# Patient Record
Sex: Female | Born: 1935 | Race: White | Hispanic: No | Smoking: Never smoker
Health system: Southern US, Community
[De-identification: ages and names within clinical notes are randomized; demographics above are authoritative.]

## PROBLEM LIST (undated history)

## (undated) DIAGNOSIS — F32A Depression, unspecified: Secondary | ICD-10-CM

## (undated) DIAGNOSIS — M549 Dorsalgia, unspecified: Secondary | ICD-10-CM

## (undated) DIAGNOSIS — R011 Cardiac murmur, unspecified: Secondary | ICD-10-CM

## (undated) DIAGNOSIS — R238 Other skin changes: Secondary | ICD-10-CM

## (undated) DIAGNOSIS — Z87898 Personal history of other specified conditions: Secondary | ICD-10-CM

## (undated) DIAGNOSIS — M810 Age-related osteoporosis without current pathological fracture: Secondary | ICD-10-CM

## (undated) DIAGNOSIS — I341 Nonrheumatic mitral (valve) prolapse: Secondary | ICD-10-CM

## (undated) DIAGNOSIS — G629 Polyneuropathy, unspecified: Secondary | ICD-10-CM

## (undated) DIAGNOSIS — R351 Nocturia: Secondary | ICD-10-CM

## (undated) DIAGNOSIS — R233 Spontaneous ecchymoses: Secondary | ICD-10-CM

## (undated) DIAGNOSIS — D649 Anemia, unspecified: Secondary | ICD-10-CM

## (undated) DIAGNOSIS — R609 Edema, unspecified: Secondary | ICD-10-CM

## (undated) DIAGNOSIS — I4891 Unspecified atrial fibrillation: Secondary | ICD-10-CM

## (undated) DIAGNOSIS — Z87442 Personal history of urinary calculi: Secondary | ICD-10-CM

## (undated) DIAGNOSIS — M1990 Unspecified osteoarthritis, unspecified site: Secondary | ICD-10-CM

## (undated) DIAGNOSIS — G20 Parkinson's disease: Secondary | ICD-10-CM

## (undated) DIAGNOSIS — F329 Major depressive disorder, single episode, unspecified: Secondary | ICD-10-CM

## (undated) DIAGNOSIS — N2889 Other specified disorders of kidney and ureter: Secondary | ICD-10-CM

## (undated) DIAGNOSIS — G20A1 Parkinson's disease without dyskinesia, without mention of fluctuations: Secondary | ICD-10-CM

## (undated) DIAGNOSIS — K219 Gastro-esophageal reflux disease without esophagitis: Secondary | ICD-10-CM

## (undated) DIAGNOSIS — R350 Frequency of micturition: Secondary | ICD-10-CM

## (undated) HISTORY — PX: TONSILLECTOMY: SUR1361

## (undated) HISTORY — DX: Unspecified osteoarthritis, unspecified site: M19.90

## (undated) HISTORY — DX: Parkinson's disease without dyskinesia, without mention of fluctuations: G20.A1

## (undated) HISTORY — PX: CATARACT EXTRACTION W/ INTRAOCULAR LENS  IMPLANT, BILATERAL: SHX1307

## (undated) HISTORY — PX: COLONOSCOPY: SHX174

## (undated) HISTORY — DX: Parkinson's disease: G20

## (undated) HISTORY — DX: Dorsalgia, unspecified: M54.9

---

## 1998-01-09 ENCOUNTER — Other Ambulatory Visit: Admission: RE | Admit: 1998-01-09 | Discharge: 1998-01-09 | Payer: Self-pay | Admitting: Obstetrics and Gynecology

## 2001-02-16 ENCOUNTER — Ambulatory Visit (HOSPITAL_COMMUNITY): Admission: RE | Admit: 2001-02-16 | Discharge: 2001-02-16 | Payer: Self-pay | Admitting: Gastroenterology

## 2003-07-05 LAB — HM DEXA SCAN

## 2005-08-04 HISTORY — PX: OTHER SURGICAL HISTORY: SHX169

## 2005-09-02 ENCOUNTER — Encounter: Admission: RE | Admit: 2005-09-02 | Discharge: 2005-09-02 | Payer: Self-pay | Admitting: Neurology

## 2005-12-05 ENCOUNTER — Ambulatory Visit (HOSPITAL_COMMUNITY): Admission: RE | Admit: 2005-12-05 | Discharge: 2005-12-05 | Payer: Self-pay | Admitting: Neurology

## 2007-11-18 ENCOUNTER — Encounter: Admission: RE | Admit: 2007-11-18 | Discharge: 2007-11-18 | Payer: Self-pay | Admitting: Orthopaedic Surgery

## 2007-12-08 ENCOUNTER — Encounter: Admission: RE | Admit: 2007-12-08 | Discharge: 2007-12-08 | Payer: Self-pay | Admitting: Orthopaedic Surgery

## 2008-01-06 ENCOUNTER — Encounter: Admission: RE | Admit: 2008-01-06 | Discharge: 2008-01-06 | Payer: Self-pay | Admitting: Endocrinology

## 2008-01-24 ENCOUNTER — Encounter: Admission: RE | Admit: 2008-01-24 | Discharge: 2008-01-24 | Payer: Self-pay | Admitting: Orthopaedic Surgery

## 2008-07-19 HISTORY — PX: BACK SURGERY: SHX140

## 2008-08-14 ENCOUNTER — Encounter: Admission: RE | Admit: 2008-08-14 | Discharge: 2008-08-14 | Payer: Self-pay | Admitting: Endocrinology

## 2008-09-26 ENCOUNTER — Ambulatory Visit (HOSPITAL_COMMUNITY): Admission: RE | Admit: 2008-09-26 | Discharge: 2008-09-26 | Payer: Self-pay | Admitting: Endocrinology

## 2009-12-21 ENCOUNTER — Encounter: Admission: RE | Admit: 2009-12-21 | Discharge: 2009-12-21 | Payer: Self-pay | Admitting: Endocrinology

## 2010-03-04 LAB — LIPID PANEL: HDL: 92 mg/dL — AB (ref 35–70)

## 2010-03-04 LAB — HEPATIC FUNCTION PANEL
ALT: 26 U/L (ref 7–35)
AST: 30 U/L (ref 13–35)
Alkaline Phosphatase: 128 U/L — AB (ref 25–125)
Bilirubin, Total: 0.4 mg/dL

## 2010-03-04 LAB — BASIC METABOLIC PANEL
Creatinine: 0.9 mg/dL (ref <=1.1)
Potassium: 4 mmol/L (ref 3.4–5.3)

## 2010-03-14 ENCOUNTER — Encounter: Admission: RE | Admit: 2010-03-14 | Discharge: 2010-03-14 | Payer: Self-pay | Admitting: Endocrinology

## 2010-05-01 LAB — CBC AND DIFFERENTIAL
Hemoglobin: 12.5 g/dL (ref 12.0–16.0)
Platelets: 159 10*3/uL (ref 150–399)
WBC: 5.6 10^3/mL

## 2010-08-04 HISTORY — PX: DILATION AND CURETTAGE OF UTERUS: SHX78

## 2010-09-05 LAB — BASIC METABOLIC PANEL
Creatinine: 0.8 mg/dL (ref <=1.1)
Glucose: 86 mg/dL
Potassium: 3.9 mmol/L (ref 3.4–5.3)

## 2010-09-05 LAB — HEPATIC FUNCTION PANEL
AST: 25 U/L (ref 13–35)
Alkaline Phosphatase: 135 U/L — AB (ref 25–125)
Bilirubin, Total: 0.3 mg/dL

## 2010-09-05 LAB — LIPID PANEL: Triglycerides: 42 mg/dL (ref 40–160)

## 2010-12-10 ENCOUNTER — Ambulatory Visit: Payer: Self-pay | Admitting: Internal Medicine

## 2010-12-12 ENCOUNTER — Ambulatory Visit (INDEPENDENT_AMBULATORY_CARE_PROVIDER_SITE_OTHER): Payer: 59 | Admitting: Internal Medicine

## 2010-12-12 ENCOUNTER — Encounter: Payer: Self-pay | Admitting: Internal Medicine

## 2010-12-12 DIAGNOSIS — G20 Parkinson's disease: Secondary | ICD-10-CM

## 2010-12-12 DIAGNOSIS — M5137 Other intervertebral disc degeneration, lumbosacral region: Secondary | ICD-10-CM

## 2010-12-12 DIAGNOSIS — M5430 Sciatica, unspecified side: Secondary | ICD-10-CM

## 2010-12-12 DIAGNOSIS — M5136 Other intervertebral disc degeneration, lumbar region: Secondary | ICD-10-CM | POA: Insufficient documentation

## 2010-12-12 DIAGNOSIS — R030 Elevated blood-pressure reading, without diagnosis of hypertension: Secondary | ICD-10-CM

## 2010-12-12 DIAGNOSIS — IMO0001 Reserved for inherently not codable concepts without codable children: Secondary | ICD-10-CM | POA: Insufficient documentation

## 2010-12-12 DIAGNOSIS — M5431 Sciatica, right side: Secondary | ICD-10-CM

## 2010-12-12 NOTE — Patient Instructions (Signed)
It was good to see you today. We have reviewed your prior records including labs and tests today - will review others as available Medications reviewed, no changes at this time. Please schedule followup in 6 months for review, call sooner if problems.

## 2010-12-12 NOTE — Progress Notes (Signed)
Subjective:    Patient ID: Dawn Ashley, female    DOB: 09-Jan-1936, 75 y.o.   MRN: [id number]  HPI New pt to me and our practice, here to establish care - prev followed with Dr. Dagoberto Ligas who has retired Reviewed chronic medical issues today -   Parkinson's dz - dx 2006 - follows with neuro ([facility]) for same - no recent exac or progressive decline noted - the patient reports compliance with medication(s) as prescribed. Denies adverse side effects.  Lumbar DDD - reports resolution of LLE pain with "bone spur surgery" 2009 - now with intermittetn RLE pain (sciatica) - pain not severe to need shot - denies wekaness or numbness - no falls or trauma. Improved pain with otc topical ointent (mineral ice) prn  White coat HTN - "always high at medical office" but normal elsewhere - no hx HTN or med issues related to ASD -no cad, cva or kidney dz - no HA, CP, weakness, vision change or edema - declines med tx for same  Past Medical History  Diagnosis Date  . Mitral valve prolapse   . Menopause   . Osteopenia   . Parkinson disease 01/2005  . Back pain   . Arthritis   . Displacement of lumbar intervertebral disc without myelopathy   . Undiagnosed cardiac murmurs   . Parkinson disease 2007  . Kidney stones    Family History  Problem Relation Age of Onset  . Breast cancer Maternal Grandmother   . Arthritis Maternal Grandmother   . Hypertension Other   . Osteoporosis Other   . Breast cancer Mother     had mastectomy in 1982  . Heart disease Mother   . Stroke Mother     mild, 2001  . Hypertension Mother   . Osteoporosis Mother     also had spine surgery 1987, & hip repalcement  . Arthritis Sister   . Hypertension Sister   . Parkinsonism Maternal Uncle   . Arthritis Maternal Grandfather    History  Substance Use Topics  . Smoking status: Never Smoker   . Smokeless tobacco: Not on file  . Alcohol Use: Yes     glass of wine     Review of Systems  Constitutional: Negative for fever.    Respiratory: Negative for cough and shortness of breath.   Cardiovascular: Negative for chest pain.  Gastrointestinal: Negative for abdominal pain.  Musculoskeletal: Negative for gait problem.  Skin: Negative for rash.  Neurological: Negative for dizziness.  No other specific complaints in a complete review of systems (except as listed in HPI above).     Objective:   Physical Exam BP 160/72  Pulse 73  Temp(Src) 97.5 F (36.4 C) (Oral)  Ht 5\' 3"  (1.6 m)  Wt 153 lb 12.8 oz (69.763 kg)  BMI 27.24 kg/m2  SpO2 99% Wt Readings from Last 3 Encounters:  12/12/10 153 lb 12.8 oz (69.763 kg)   Physical Exam  Constitutional: She is oriented to person, place, and time. She appears well-developed and well-nourished. No distress.  HENT: Head: Normocephalic and atraumatic. Ears: TMs clear B; Nose: Nose normal.  Mouth/Throat: Oropharynx is clear and moist. No oropharyngeal exudate.  Eyes: Conjunctivae and EOM are normal. Pupils are equal, round, and reactive to light. No scleral icterus.  Neck: Normal range of motion. Neck supple. No JVD present. No thyromegaly present.  Cardiovascular: Normal rate, regular rhythm and normal heart sounds.  No murmur heard. Pulmonary/Chest: Effort normal and breath sounds normal. No respiratory distress. She  has no wheezes.  Musculoskeletal: Normal range of motion. She exhibits no edema. Back: full range of motion of thoracic and lumbar spine. Non tender to palpation. Negative straight leg raise. DTR's are symmetrically intact. Sensation intact in all dermatomes of the lower extremities. Full strength to manual muscle testing; able to heel toe walk without difficulty and ambulates with a normal gait. Neurological: She is alert and oriented to person, place, and time. No cranial nerve deficit. Coordination normal.  Skin: Skin is warm and dry. No rash noted. No erythema.  Psychiatric: She has a normal mood and affect. Her behavior is normal. Judgment and thought  content normal.   Lab Results  Component Value Date   WBC 5.6 05/01/2010   HGB 12.5 05/01/2010   HCT 39 05/01/2010   PLT 159 05/01/2010   CHOL 190 09/05/2010   TRIG 42 09/05/2010   HDL 97* 09/05/2010   ALT 18 08/09/1094   AST 25 09/05/2010   NA 136* 09/05/2010   K 3.9 09/05/2010   CREATININE 0.8 09/05/2010   BUN 19 09/05/2010        Assessment & Plan:  See problem list. Medications and labs reviewed today. Time spent with pt/family today 40 minutes, greater than 50% time spent counseling patient on  Sciatica, white coat HTN and medication review. Also review of prior records

## 2010-12-13 ENCOUNTER — Encounter: Payer: Self-pay | Admitting: Internal Medicine

## 2010-12-13 NOTE — Assessment & Plan Note (Signed)
Follows with neuro for same - predominately affects LUE/hand The current medical regimen is effective;  continue present plan and medications.  

## 2010-12-13 NOTE — Assessment & Plan Note (Signed)
Hx "bone spur" surgery for LLE pain 2009 ([facility]) Now Dawn Ashley mgmt of RLE intermittent pain symptoms (sciatica) Advised PT and tylenol or NSAIDs otc as needed - pt agrees and will call if symptoms change for worse

## 2010-12-13 NOTE — Assessment & Plan Note (Signed)
BP Readings from Last 3 Encounters:  12/12/10 160/72   Pt reports long hx same - will monitor without med rec at this time

## 2010-12-31 ENCOUNTER — Encounter: Payer: Self-pay | Admitting: Internal Medicine

## 2010-12-31 ENCOUNTER — Ambulatory Visit (INDEPENDENT_AMBULATORY_CARE_PROVIDER_SITE_OTHER): Payer: 59 | Admitting: Internal Medicine

## 2010-12-31 VITALS — BP 140/72 | HR 84 | Temp 98.1°F | Ht 63.0 in | Wt 153.4 lb

## 2010-12-31 DIAGNOSIS — M5416 Radiculopathy, lumbar region: Secondary | ICD-10-CM

## 2010-12-31 DIAGNOSIS — IMO0002 Reserved for concepts with insufficient information to code with codable children: Secondary | ICD-10-CM

## 2010-12-31 MED ORDER — HYDROCODONE-ACETAMINOPHEN 5-500 MG PO TABS: 1.0000 | ORAL_TABLET | ORAL | Status: DC | PRN

## 2010-12-31 MED ORDER — PREDNISONE (PAK) 10 MG PO TABS: 10.0000 mg | ORAL_TABLET | ORAL | Status: AC

## 2010-12-31 NOTE — Progress Notes (Signed)
Subjective:    Patient ID: Dawn Ashley, female    DOB: 05-12-1936, 75 y.o.   MRN: [id number]  HPI complains of LLE pain Hx same - surg for "bone spur" in 2009 to help LLE pain Now 1 week pain in L buttock radiating down back of leg to above the knee No fall or trauma, no fever - no back pain No dysuria or new bowel/bladder problems - no weakness but pain 9/10 with standing   Also reviewed chronic medical issues today -   Parkinson's dz - dx 2006 - follows with neuro ([facility]) for same - no recent exac or progressive decline noted - the patient reports compliance with medication(s) as prescribed. Denies adverse side effects.  Lumbar DDD - see above- reports prior LLE pain but resolved with "bone spur surgery" 2009 - intermittent RLE pain (sciatica) - pain not severe to need shot - denies weakness or numbness - no falls or trauma. Improved pain with otc topical ointent (mineral ice) prn  White coat HTN - "always high at medical office" but normal elsewhere - no hx HTN or med issues related to ASD -no cad, cva or kidney dz - no HA, CP, weakness, vision change or edema - declines med tx for same  Past Medical History  Diagnosis Date  . Mitral valve prolapse   . Menopause   . Osteopenia   . Parkinson disease 01/2005  . Back pain   . Arthritis   . Displacement of lumbar intervertebral disc without myelopathy   . Undiagnosed cardiac murmurs   . Parkinson disease 2007  . Kidney stones      Review of Systems Constitutional: Negative for fever.  Respiratory: Negative for cough and shortness of breath.   Cardiovascular: Negative for chest pain.     Objective:   Physical Exam  BP 140/72  Pulse 84  Temp(Src) 98.1 F (36.7 C) (Oral)  Ht 5\' 3"  (1.6 m)  Wt 153 lb 6.4 oz (69.582 kg)  BMI 27.17 kg/m2  SpO2 99% Wt Readings from Last 3 Encounters:  12/31/10 153 lb 6.4 oz (69.582 kg)  12/12/10 153 lb 12.8 oz (69.763 kg)   Physical Exam  Constitutional: She is oriented to person,  place, and time. She appears well-developed and well-nourished. No distress.   Cardiovascular: Normal rate, regular rhythm and normal heart sounds.  No murmur heard. Pulmonary/Chest: Effort normal and breath sounds normal. No respiratory distress. She has no wheezes.  Musculoskeletal: Normal range of motion. She exhibits no edema. Back: full range of motion of thoracic and lumbar spine. Non tender to palpation. Positive contralteral straight leg raise. DTR's are symmetrically intact. Sensation intact in all dermatomes of the lower extremities. Full strength to manual muscle testing; able to heel toe walk without difficulty and ambulates with a normal gait. Neurological: She is alert and oriented to person, place, and time. No cranial nerve deficit. Coordination normal.   Lab Results  Component Value Date   WBC 5.6 05/01/2010   HGB 12.5 05/01/2010   HCT 39 05/01/2010   PLT 159 05/01/2010   CHOL 190 09/05/2010   TRIG 42 09/05/2010   HDL 97* 09/05/2010   ALT 18 04/04/4781   AST 25 09/05/2010   NA 136* 09/05/2010   K 3.9 09/05/2010   CREATININE 0.8 09/05/2010   BUN 19 09/05/2010        Assessment & Plan:  LLE pain due to lumbar radiculopathy - hx same requiring surg in 2009 ([facility]) - tx acute symptoms  with pred pak, vicodin for severe pain symptoms and refer to "local" specialist given prior surg hx - pt [facility] if intervention needed - no red flags on hx or exam - questions answered and education provided

## 2010-12-31 NOTE — Patient Instructions (Signed)
It was good to see you today. Your left leg pain is coming from irritation of nerve in your back -  Treat pain with Prednisone pack (every day x 6 days) and Vicodin for pain IF needed - Your prescription(s) have been submitted to your pharmacy. Please take as directed and contact our office if you believe you are having problem(s) with the medication(s). we'll make referral to back specialist given your prior surgical history. Our office will contact you regarding appointment(s) once made. Please keep schedule followup as planned, call sooner if problems.

## 2011-01-01 ENCOUNTER — Ambulatory Visit: Payer: 59 | Admitting: Internal Medicine

## 2011-01-06 ENCOUNTER — Telehealth: Payer: Self-pay | Admitting: Internal Medicine

## 2011-01-06 ENCOUNTER — Telehealth: Payer: Self-pay

## 2011-01-06 MED ORDER — CELECOXIB 200 MG PO CAPS: 200.0000 mg | ORAL_CAPSULE | Freq: Every day | ORAL | Status: DC

## 2011-01-06 MED ORDER — METHOCARBAMOL 500 MG PO TABS: 500.0000 mg | ORAL_TABLET | Freq: Three times a day (TID) | ORAL | Status: AC | PRN

## 2011-01-06 NOTE — Telephone Encounter (Signed)
Pt called stating she was seen for pinched nerve and Rx'd pred. Pt states that it was not helping, she called [facility] yesterday and Rx's muscle relaxer that is also not helping. Pt is requesting advisement form [facility]

## 2011-01-06 NOTE — Telephone Encounter (Signed)
I d/w [facility] - they (nsurg) will call pt directly if able to work in pt this week, otherwise, keep apt as planned

## 2011-01-06 NOTE — Telephone Encounter (Signed)
Pt advised and states that she she will be out of town the week of the 06/11 and therefore changed her appt to 06/21. Pt is available for appt this week if possible

## 2011-01-06 NOTE — Telephone Encounter (Signed)
Has appt with N[facility]urg ([facility]) 01/16/11 - will check if this can be moved any sooner? - In meanwhile try celebrex for pain, or 1/2 tab of vicodin (less sedation) -also different muscle relaxer (robaxin) to use instead of call a nurse's flexeril - erx celebrex and robaxin done -

## 2011-01-06 NOTE — Telephone Encounter (Signed)
Reviewed and agree.

## 2011-01-06 NOTE — Telephone Encounter (Signed)
[facility] Triage Call Report Triage Record Num: [id number] Operator: Tarri Glenn Patient Name: Dawn Ashley Call Date & Time: 01/05/2011 9:37:11AM Patient Phone: [phone] PCP: Rene Paci Patient Gender: Female PCP Fax : [phone] Patient DOB: [birth year] Practice Name: [facility] Reason for Call: Patient calling about pinched nerve and was put on Prednisone and took last pill 01/05/11 and still has pain. Patient was also given Vicodin for pain, patient took one pill 01/04/11 and slept most of the day. Would prefer not to take any more Vicodin. Afebrile. All emergent s/s r/o with exception to mild to moderate pain in back with normal activity or rest and not responding to 72 hours of home care per Back Symptoms protocol. Prescription for Flexaril 5mg  1 TID prn #4 called into [facility] @ [phone] to the prescription voice mail, per standing orders. Advised patient to call office 01/06/11. Protocol(s) Used: Back Symptoms Recommended Outcome per Protocol: See Provider within 72 Hours Reason for Outcome: Mild to moderate pain in back with normal activity or rest AND not responding to 72 hours of home care Care Advice: ~ Call provider if symptoms worsen or new symptoms develop. ~ Avoid heavy lifting, bending and twisting of the back, and prolonged sitting until evaluated by provider. Apply a cloth-covered cold or ice pack to the area for 20 minutes 4 to 8 times a day for relief of pain for the first 24-48 hours. After 24 to 48 hours of cold application, use a cloth-covered heat pack to the area for 20 minutes 3 to 4 times a day. ~ Go to the ED if you have worsening pain, numbness or weakness of arms or legs, cannot walk, or have new unexplained changes in bladder or bowel function. Another adult should drive. ~ ~ Avoid activity that causes or worsens symptoms. 01/05/2011 9:56:59AM Page 1 of 1 [email or web address]

## 2011-01-21 ENCOUNTER — Other Ambulatory Visit: Payer: Self-pay | Admitting: Neurological Surgery

## 2011-01-21 DIAGNOSIS — M5136 Other intervertebral disc degeneration, lumbar region: Secondary | ICD-10-CM

## 2011-01-28 ENCOUNTER — Ambulatory Visit
Admission: RE | Admit: 2011-01-28 | Discharge: 2011-01-28 | Disposition: A | Discharge status: Home / Self Care | Payer: 59 | Source: Ambulatory Visit | Attending: Neurological Surgery | Admitting: Neurological Surgery

## 2011-01-28 DIAGNOSIS — M5136 Other intervertebral disc degeneration, lumbar region: Secondary | ICD-10-CM

## 2011-02-13 ENCOUNTER — Other Ambulatory Visit: Payer: Self-pay | Admitting: Internal Medicine

## 2011-02-13 DIAGNOSIS — Z1231 Encounter for screening mammogram for malignant neoplasm of breast: Secondary | ICD-10-CM

## 2011-02-14 ENCOUNTER — Telehealth: Payer: Self-pay | Admitting: Internal Medicine

## 2011-02-14 NOTE — Telephone Encounter (Signed)
Forwarded to Dr. Leschber for review.  °

## 2011-03-11 ENCOUNTER — Ambulatory Visit: Payer: Self-pay | Admitting: Internal Medicine

## 2011-03-17 ENCOUNTER — Ambulatory Visit
Admission: RE | Admit: 2011-03-17 | Discharge: 2011-03-17 | Disposition: A | Discharge status: Home / Self Care | Payer: PRIVATE HEALTH INSURANCE | Source: Ambulatory Visit | Attending: Internal Medicine | Admitting: Internal Medicine

## 2011-03-17 DIAGNOSIS — Z1231 Encounter for screening mammogram for malignant neoplasm of breast: Secondary | ICD-10-CM

## 2011-06-03 ENCOUNTER — Ambulatory Visit (HOSPITAL_BASED_OUTPATIENT_CLINIC_OR_DEPARTMENT_OTHER): Admission: RE | Admit: 2011-06-03 | Payer: PRIVATE HEALTH INSURANCE | Source: Ambulatory Visit | Admitting: Gynecology

## 2011-06-12 ENCOUNTER — Telehealth: Payer: Self-pay | Admitting: Internal Medicine

## 2011-06-12 ENCOUNTER — Encounter: Payer: Self-pay | Admitting: Internal Medicine

## 2011-06-12 ENCOUNTER — Ambulatory Visit (INDEPENDENT_AMBULATORY_CARE_PROVIDER_SITE_OTHER): Payer: 59 | Admitting: Internal Medicine

## 2011-06-12 VITALS — BP 130/72 | HR 84 | Temp 97.9°F | Wt 153.0 lb

## 2011-06-12 DIAGNOSIS — G20 Parkinson's disease: Secondary | ICD-10-CM

## 2011-06-12 DIAGNOSIS — G20A1 Parkinson's disease without dyskinesia, without mention of fluctuations: Secondary | ICD-10-CM

## 2011-06-12 DIAGNOSIS — M51369 Other intervertebral disc degeneration, lumbar region without mention of lumbar back pain or lower extremity pain: Secondary | ICD-10-CM

## 2011-06-12 DIAGNOSIS — H00029 Hordeolum internum unspecified eye, unspecified eyelid: Secondary | ICD-10-CM

## 2011-06-12 DIAGNOSIS — IMO0001 Reserved for inherently not codable concepts without codable children: Secondary | ICD-10-CM

## 2011-06-12 DIAGNOSIS — M5137 Other intervertebral disc degeneration, lumbosacral region: Secondary | ICD-10-CM

## 2011-06-12 DIAGNOSIS — R030 Elevated blood-pressure reading, without diagnosis of hypertension: Secondary | ICD-10-CM

## 2011-06-12 DIAGNOSIS — M5136 Other intervertebral disc degeneration, lumbar region: Secondary | ICD-10-CM

## 2011-06-12 MED ORDER — BACITRACIN 500 UNIT/GM OP OINT: TOPICAL_OINTMENT | Freq: Four times a day (QID) | OPHTHALMIC | Status: AC

## 2011-06-12 NOTE — Assessment & Plan Note (Signed)
BP Readings from Last 3 Encounters:  06/12/11 130/72  12/31/10 140/72  12/12/10 160/72   Pt reports long hx same - will monitor without med rec at this time

## 2011-06-12 NOTE — Assessment & Plan Note (Signed)
Hx "bone spur" surgery for LLE pain 2009 ([facility]) Now s/p steroid injections x4 summer 2012 by neurosurgery Danielle Dess) Advised PT and tylenol or NSAIDs otc as needed - pt agrees and will call if symptoms change for worse

## 2011-06-12 NOTE — Assessment & Plan Note (Signed)
Follows with neuro for same - predominately affects LUE/hand The current medical regimen is effective;  continue present plan and medications.  

## 2011-06-12 NOTE — Patient Instructions (Signed)
It was good to see you today. We have reviewed your interval medical history today Use bacitracin eye ointment to affected eye. If symptoms unimproved the next 7-10 days call your eye doctor's office [facility]) Your prescription(s) have been submitted to your pharmacy. Please take as directed and contact our office if you believe you are having problem(s) with the medication(s). Other Medications reviewed, no additional changes at this time. Let us know when you're ready for colonoscopy referral Please schedule followup in 6-12 months, call sooner if problems.

## 2011-06-12 NOTE — Progress Notes (Signed)
  Subjective:    Patient ID: Dawn Ashley, female    DOB: 10/03/1935, 75 y.o.   MRN: [id number]  HPI here for follow up - reviewed chronic medical issues:  Parkinson's dz - dx 2006 - follows with neuro ([facility]) for same - no recent exac or progressive decline noted - the patient reports compliance with medication(s) as prescribed. Denies adverse side effects.  Lumbar DDD - reports  LLE pain improved s/p 4 steroid injections by [facility] 8&04/2011 - prev resolved with "bone spur surgery" 2009 by [facility] - intermittent RLE pain (sciatica) - denies weakness or numbness - no falls or trauma. Improved pain with otc topical ointent (mineral ice) prn  White coat HTN - "always high at medical office" but normal elsewhere - no hx HTN or med issues related to ASD -no cad, cva or kidney dz - no HA, CP, weakness, vision change or edema - declines med tx for same  complains of "irritation" left lower eyelid Onset 3 days ago associated with crusting in AM, no pain or vision changes No trauma  Past Medical History  Diagnosis Date  . Mitral valve prolapse   . Menopause   . Osteopenia   . Parkinson disease 01/2005  . Back pain   . Arthritis   . Displacement of lumbar intervertebral disc without myelopathy   . Undiagnosed cardiac murmurs   . Parkinson disease 2007  . Kidney stones      Review of Systems Constitutional: Negative for fever.  Respiratory: Negative for cough and shortness of breath.   Cardiovascular: Negative for chest pain.     Objective:   Physical Exam  BP 130/72  Pulse 84  Temp(Src) 97.9 F (36.6 C) (Oral)  Wt 153 lb (69.4 kg)  SpO2 97% Wt Readings from Last 3 Encounters:  06/12/11 153 lb (69.4 kg)  12/31/10 153 lb 6.4 oz (69.582 kg)  12/12/10 153 lb 12.8 oz (69.763 kg)   Constitutional: She appears well-developed and well-nourished. No distress.   Eye: L lower eyelid with 3mm inner hordeolum  Cardiovascular: Normal rate, regular rhythm and normal heart sounds.  No murmur  heard. chronic R>L LE edema Pulmonary/Chest: Effort normal and breath sounds normal. No respiratory distress. She has no wheezes.  Neurological: She is alert and oriented to person, place, and time. No cranial nerve deficit. Coordination normal.   Lab Results  Component Value Date   WBC 5.6 05/01/2010   HGB 12.5 05/01/2010   HCT 39 05/01/2010   PLT 159 05/01/2010   CHOL 190 09/05/2010   TRIG 42 09/05/2010   HDL 97* 09/05/2010   ALT 18 02/09/2955   AST 25 09/05/2010   NA 136* 09/05/2010   K 3.9 09/05/2010   CREATININE 0.8 09/05/2010   BUN 19 09/05/2010        Assessment & Plan:  See problem list. Medications and labs reviewed today.  R hordeolum - reassurance, education > abs drops and call optho if unimproved in next 7-10 days   Postmenopausal spotting - following with gyn - planning hysteroscopy this month with gyn - reviewed same with pt today  LLE pain due to lumbar radiculopathy - hx same requiring surg in 2009 ([facility]) - tx current symptoms with steroid injections by Nsurg ([facility]) x2 in 03/2011 and x 2 04/2011

## 2011-06-12 NOTE — Telephone Encounter (Signed)
Pt called to inform MD of the date fo her most recent fluvax

## 2011-07-24 ENCOUNTER — Telehealth: Payer: Self-pay | Admitting: Internal Medicine

## 2011-07-24 NOTE — Telephone Encounter (Signed)
Received copies from [facility] ,on 12.20.12 . Forwarded 6 pages to Dr. Felicity Coyer ,for review.  sj

## 2011-10-21 ENCOUNTER — Other Ambulatory Visit: Payer: Self-pay | Admitting: Neurology

## 2011-10-21 ENCOUNTER — Ambulatory Visit
Admission: RE | Admit: 2011-10-21 | Discharge: 2011-10-21 | Disposition: A | Discharge status: Home / Self Care | Payer: Medicare Other | Source: Ambulatory Visit | Attending: Neurology | Admitting: Neurology

## 2011-10-21 DIAGNOSIS — G20 Parkinson's disease: Secondary | ICD-10-CM

## 2011-10-21 DIAGNOSIS — G20A1 Parkinson's disease without dyskinesia, without mention of fluctuations: Secondary | ICD-10-CM

## 2011-10-21 DIAGNOSIS — M25552 Pain in left hip: Secondary | ICD-10-CM

## 2012-01-21 ENCOUNTER — Other Ambulatory Visit (INDEPENDENT_AMBULATORY_CARE_PROVIDER_SITE_OTHER): Payer: Medicare Other

## 2012-01-21 ENCOUNTER — Ambulatory Visit (INDEPENDENT_AMBULATORY_CARE_PROVIDER_SITE_OTHER)
Admission: RE | Admit: 2012-01-21 | Discharge: 2012-01-21 | Disposition: A | Discharge status: Home / Self Care | Payer: Medicare Other | Source: Ambulatory Visit | Attending: Internal Medicine | Admitting: Internal Medicine

## 2012-01-21 ENCOUNTER — Encounter: Payer: Self-pay | Admitting: Internal Medicine

## 2012-01-21 ENCOUNTER — Ambulatory Visit (INDEPENDENT_AMBULATORY_CARE_PROVIDER_SITE_OTHER): Payer: Medicare Other | Admitting: Internal Medicine

## 2012-01-21 VITALS — BP 148/70 | HR 102 | Temp 97.1°F | Ht 63.0 in | Wt 158.2 lb

## 2012-01-21 DIAGNOSIS — I10 Essential (primary) hypertension: Secondary | ICD-10-CM

## 2012-01-21 DIAGNOSIS — R609 Edema, unspecified: Secondary | ICD-10-CM

## 2012-01-21 DIAGNOSIS — G20 Parkinson's disease: Secondary | ICD-10-CM

## 2012-01-21 DIAGNOSIS — G20A1 Parkinson's disease without dyskinesia, without mention of fluctuations: Secondary | ICD-10-CM

## 2012-01-21 DIAGNOSIS — IMO0001 Reserved for inherently not codable concepts without codable children: Secondary | ICD-10-CM

## 2012-01-21 DIAGNOSIS — R600 Localized edema: Secondary | ICD-10-CM

## 2012-01-21 LAB — CBC WITH DIFFERENTIAL/PLATELET
Basophils Relative: 0.6 % (ref 0.0–3.0)
Eosinophils Relative: 3 % (ref 0.0–5.0)
HCT: 37.3 % (ref 36.0–46.0)
Lymphs Abs: 2.1 10*3/uL (ref 0.7–4.0)
MCV: 95.4 fl (ref 78.0–100.0)
Monocytes Absolute: 0.7 10*3/uL (ref 0.1–1.0)
Neutro Abs: 3.3 10*3/uL (ref 1.4–7.7)
RBC: 3.91 Mil/uL (ref 3.87–5.11)
WBC: 6.4 10*3/uL (ref 4.5–10.5)

## 2012-01-21 LAB — URINALYSIS, ROUTINE W REFLEX MICROSCOPIC
Bilirubin Urine: NEGATIVE
Ketones, ur: NEGATIVE
Total Protein, Urine: NEGATIVE
pH: 6 (ref 5.0–8.0)

## 2012-01-21 MED ORDER — HYDROCHLOROTHIAZIDE 25 MG PO TABS: 25.0000 mg | ORAL_TABLET | Freq: Every day | ORAL | Status: DC

## 2012-01-21 NOTE — Patient Instructions (Addendum)
Take all new medications as prescribed - the new mild fluid pill - Hydrochlorothiazide 25 mg per day Continue all other medications as before Your EKG was OK today (normal) Please go to XRAY in the Basement for the x-ray test Please go to LAB in the Basement for the blood and/or urine tests to be done today (to assess for kidney, liver, anemia, thyroid) You will be contacted regarding the referral for: Echocardiogram to assess the heart function You will be contacted by phone if any changes need to be made immediately.  Otherwise, you will receive a letter about your results with an explanation.

## 2012-01-21 NOTE — Assessment & Plan Note (Signed)
New onset, moderate to severe to LE's only, has varicosities by minor compared to recent edema and wt gain;  Recent April 2013 labs reassuring but will need repeat including UA to assess for proteinuria, ECG reviewed as per emr - NSR, also for cxr, and echo as she has not had these, r/o diast CHF; has f/u appt with Dr Felicity Coyer July 8

## 2012-01-21 NOTE — Assessment & Plan Note (Signed)
I think has mild memory/cognitive dysfunction though not formally tested today; cont to monitor, may benefit from further neuro consideration

## 2012-01-21 NOTE — Assessment & Plan Note (Signed)
Mild elev again today, may improve with HCTZ as well,  to f/u any worsening symptoms or concerns

## 2012-01-21 NOTE — Progress Notes (Signed)
Subjective:    Patient ID: Dawn Ashley, female    DOB: 1935/12/27, 76 y.o.   MRN: [id number]  HPI  76yo F pt of Dr Felicity Coyer here with c/o new onset peripheral edema to both legs today up to the knees with some discomfort but very mild, diffuse, and better with advil prn,  Has noticed wt gain 145 to 158 per pt in the past yr, maybe most of that recently;  Pt denies chest pain, increased sob or doe, wheezing, orthopnea, PND, palpitations, dizziness or syncope.  Pt denies new neurological symptoms such as new headache, or facial or extremity weakness or numbness, though has ongoing Parkinsons.  Denies forced po fluids ongoing.  Had recent panel of labs April 2013 with normal cr and lft's.   Pt denies fever, wt loss, night sweats, loss of appetite, or other constitutional symptoms Past Medical History  Diagnosis Date  . Mitral valve prolapse   . Menopause   . Osteopenia   . Parkinson disease 01/2005  . Back pain   . Arthritis   . Displacement of lumbar intervertebral disc without myelopathy   . Undiagnosed cardiac murmurs   . Parkinson disease 2007  . Kidney stones    Past Surgical History  Procedure Date  . Tonsillectomy   . Back surgery 2009    spurs resting on nerve  . Eye surgery 2008 & 2011    Cataract on both    reports that she has never smoked. She does not have any smokeless tobacco history on file. She reports that she drinks alcohol. She reports that she does not use illicit drugs. family history includes Arthritis in her maternal grandfather, maternal grandmother, and sister; Breast cancer in her maternal grandmother and mother; Heart disease in her mother; Hypertension in her mother, other, and sister; Osteoporosis in her mother and other; Parkinsonism in her maternal uncle; and Stroke in her mother. No Known Allergies Current Outpatient Prescriptions on File Prior to Visit  Medication Sig Dispense Refill  . aspirin 81 MG EC tablet Take 81 mg by mouth daily.        Marland Kitchen  CALCIUM CITRATE PO Take 630 mg by mouth 2 (two) times daily.        . Cholecalciferol (VITAMIN D) 2000 UNITS CAPS Take by mouth. Take 2 by mouth daily       . conjugated estrogens (PREMARIN) vaginal cream Place vaginally 2 (two) times a week.        . pramipexole (MIRAPEX) 0.5 MG tablet Take 0.5 mg by mouth 3 (three) times daily.        . rasagiline (AZILECT) 0.5 MG TABS Take 0.5 mg by mouth daily.        . hydrochlorothiazide (HYDRODIURIL) 25 MG tablet Take 1 tablet (25 mg total) by mouth daily.  90 tablet  3   Review of Systems Review of Systems  Constitutional: Negative for diaphoresis and unexpected weight change.    Eyes: Negative for photophobia and visual disturbance.  Respiratory: Negative for choking and stridor.   Gastrointestinal: Negative for vomiting and blood in stool.  Genitourinary: Negative for hematuria and decreased urine volume.  Musculoskeletal: Negative for gait problem.  Skin: Negative for color change and wound. Psychiatric/Behavioral: Negative for decreased concentration. The patient is not hyperactive.       Objective:   Physical Exam BP 148/70  Pulse 102  Temp 97.1 F (36.2 C) (Oral)  Ht 5\' 3"  (1.6 m)  Wt 158 lb 4 oz (71.782 kg)  BMI 28.03 kg/m2  SpO2 97% Physical Exam  VS noted Constitutional: Pt appears well-developed and well-nourished.  HENT: Head: Normocephalic.  Right Ear: External ear normal.  Left Ear: External ear normal.  Eyes: Conjunctivae and EOM are normal. Pupils are equal, round, and reactive to light.  Neck: Normal range of motion. Neck supple.  Cardiovascular: Normal rate and regular rhythm.   Pulmonary/Chest: Effort normal and breath sounds normal.  Abd:  Soft, NT, non-distended, + BS Neurological: Pt is alert, but has some ? Memory or cognitive dysfunction Skin: Skin is warm. No erythema.  2+ edema to knees Psychiatric: Pt behavior is normal.     Assessment & Plan:

## 2012-01-22 ENCOUNTER — Encounter: Payer: Self-pay | Admitting: Internal Medicine

## 2012-01-22 LAB — HEPATIC FUNCTION PANEL
AST: 30 U/L (ref 0–37)
Total Bilirubin: 0.3 mg/dL (ref 0.3–1.2)

## 2012-01-22 LAB — BASIC METABOLIC PANEL
BUN: 26 mg/dL — ABNORMAL HIGH (ref 6–23)
Creatinine, Ser: 0.9 mg/dL (ref 0.4–1.2)
GFR: 68.54 mL/min (ref >60.00)
Potassium: 4.8 mEq/L (ref 3.5–5.1)

## 2012-02-09 ENCOUNTER — Encounter: Payer: Self-pay | Admitting: Internal Medicine

## 2012-02-09 ENCOUNTER — Ambulatory Visit (INDEPENDENT_AMBULATORY_CARE_PROVIDER_SITE_OTHER): Payer: Medicare Other | Admitting: Internal Medicine

## 2012-02-09 VITALS — BP 138/72 | HR 75 | Temp 97.3°F | Ht 63.0 in | Wt 154.8 lb

## 2012-02-09 DIAGNOSIS — R609 Edema, unspecified: Secondary | ICD-10-CM

## 2012-02-09 DIAGNOSIS — M5137 Other intervertebral disc degeneration, lumbosacral region: Secondary | ICD-10-CM

## 2012-02-09 DIAGNOSIS — IMO0001 Reserved for inherently not codable concepts without codable children: Secondary | ICD-10-CM

## 2012-02-09 DIAGNOSIS — I10 Essential (primary) hypertension: Secondary | ICD-10-CM

## 2012-02-09 DIAGNOSIS — M5136 Other intervertebral disc degeneration, lumbar region: Secondary | ICD-10-CM

## 2012-02-09 DIAGNOSIS — Z1211 Encounter for screening for malignant neoplasm of colon: Secondary | ICD-10-CM

## 2012-02-09 MED ORDER — VITAMIN D 50 MCG (2000 UT) PO CAPS: 2000.0000 [IU] | ORAL_CAPSULE | Freq: Two times a day (BID) | ORAL | Status: DC

## 2012-02-09 NOTE — Patient Instructions (Signed)
It was good to see you today. We have reviewed your prior records including labs and tests today Test(s) ordered today. Your results will be called to you after review (48-72hours after test completion). If any changes need to be made, you will be notified at that time. Please schedule followup in 6 months, call sooner if problems.  

## 2012-02-09 NOTE — Assessment & Plan Note (Signed)
Started HCTZ for edema 01/2012 BP Readings from Last 3 Encounters:  02/09/12 138/72  01/21/12 148/70  06/12/11 130/72   The current medical regimen is effective;  continue present plan and medications.

## 2012-02-09 NOTE — Assessment & Plan Note (Signed)
New onset 01/2012 - mild - mod in BLEs only - started HCTZ for same Onset in setting of wt gain (20# in past 2-3years);   reviewed April and June 2013 labs and ECG reviewed as per emr - NSR Pending Echo 7/20-13 to rule out diast CHF Continue low dose diuretic as ongoing

## 2012-02-09 NOTE — Progress Notes (Signed)
  Subjective:    Patient ID: Dawn Ashley, female    DOB: August 02, 1936, 76 y.o.   MRN: [id number]  HPI here for follow up - reviewed chronic medical issues today -   Parkinson's dz - dx 2006 - follows with neuro ([facility]) for same - no recent exac or progressive decline noted - the patient reports compliance with medication(s) as prescribed. Denies adverse side effects.  Lumbar DDD with episodic radiculopathy- reports LLE pain initially resolved with "bone spur surgery" 2009 - also intermittent RLE pain (sciatica) -improved with steroid shots ([facility] at [facility]) and now on NSAIDs - denies weakness or numbness - no falls or trauma. Improved pain with otc topical ointent (mineral ice) prn  White coat HTN - "always high at medical office" but normal elsewhere - no history ASD -no cad, cva or kidney dz - no headache, chest pain, weakness, vision change - recent mild BLE dependant edema 01/2012 so started HCTZ for same - the patient reports compliance with medication(s) as prescribed. Denies adverse side effects.   Past Medical History  Diagnosis Date  . Mitral valve prolapse   . Menopause   . Osteopenia   . Parkinson disease 01/2005  . Back pain   . Arthritis   . Displacement of lumbar intervertebral disc without myelopathy   . Undiagnosed cardiac murmurs   . Parkinson disease 2007  . Kidney stones      Review of Systems Constitutional: Negative for fever or unexpected wight change.  Respiratory: Negative for cough and shortness of breath.   Cardiovascular: Negative for chest pain.     Objective:   Physical Exam  BP 138/72  Pulse 75  Temp 97.3 F (36.3 C) (Oral)  Ht 5\' 3"  (1.6 m)  Wt 154 lb 12.8 oz (70.217 kg)  BMI 27.42 kg/m2  SpO2 97% Wt Readings from Last 3 Encounters:  02/09/12 154 lb 12.8 oz (70.217 kg)  01/21/12 158 lb 4 oz (71.782 kg)  06/12/11 153 lb (69.4 kg)   Constitutional: She appears well-developed and well-nourished. No distress.  Dtr at side Neck: Normal range of  motion. Neck supple. No JVD present. No thyromegaly present.  Cardiovascular: Normal rate, regular rhythm and normal heart sounds.  No murmur heard. No BLE edema but mildly "fatty" ankles Pulmonary/Chest: Effort normal and breath sounds normal. No respiratory distress. She has no wheezes. Psychiatric: She has a normal mood and affect. Her behavior is normal. Judgment and thought content normal.    Lab Results  Component Value Date   WBC 6.4 01/21/2012   HGB 12.4 01/21/2012   HCT 37.3 01/21/2012   PLT 139.0* 01/21/2012   CHOL 190 09/05/2010   TRIG 42 09/05/2010   HDL 97* 09/05/2010   ALT 21 9/52/8413   AST 30 01/21/2012   NA 143 01/21/2012   K 4.8 01/21/2012   CL 110 01/21/2012   CREATININE 0.9 01/21/2012   BUN 26* 01/21/2012   CO2 29 01/21/2012   TSH 2.74 01/21/2012        Assessment & Plan:  See problem list. Medications and labs reviewed today.

## 2012-02-09 NOTE — Assessment & Plan Note (Signed)
Hx "bone spur" surgery for LLE pain 2009 ([facility]) Now s/p steroid injections x4 summer 2012 by neurosurgery Danielle Dess) and 2013 by Lutricia Feil continued PT and tylenol or NSAIDs otc as needed - pt agrees and will call if symptoms change for worse

## 2012-02-10 ENCOUNTER — Other Ambulatory Visit (INDEPENDENT_AMBULATORY_CARE_PROVIDER_SITE_OTHER): Payer: Medicare Other

## 2012-02-10 ENCOUNTER — Other Ambulatory Visit: Payer: Self-pay | Admitting: Internal Medicine

## 2012-02-10 ENCOUNTER — Encounter: Payer: Self-pay | Admitting: Internal Medicine

## 2012-02-10 DIAGNOSIS — IMO0001 Reserved for inherently not codable concepts without codable children: Secondary | ICD-10-CM

## 2012-02-10 DIAGNOSIS — Z1231 Encounter for screening mammogram for malignant neoplasm of breast: Secondary | ICD-10-CM

## 2012-02-10 DIAGNOSIS — I10 Essential (primary) hypertension: Secondary | ICD-10-CM

## 2012-02-10 LAB — LIPID PANEL
Cholesterol: 192 mg/dL (ref 0–200)
Total CHOL/HDL Ratio: 2
Triglycerides: 29 mg/dL (ref 0.0–149.0)

## 2012-02-11 ENCOUNTER — Encounter: Payer: Self-pay | Admitting: Internal Medicine

## 2012-02-11 ENCOUNTER — Ambulatory Visit (HOSPITAL_COMMUNITY): Payer: Medicare Other | Attending: Cardiovascular Disease | Admitting: Radiology

## 2012-02-11 DIAGNOSIS — I10 Essential (primary) hypertension: Secondary | ICD-10-CM | POA: Insufficient documentation

## 2012-02-11 DIAGNOSIS — I517 Cardiomegaly: Secondary | ICD-10-CM | POA: Insufficient documentation

## 2012-02-11 DIAGNOSIS — G20 Parkinson's disease: Secondary | ICD-10-CM | POA: Insufficient documentation

## 2012-02-11 DIAGNOSIS — G20A1 Parkinson's disease without dyskinesia, without mention of fluctuations: Secondary | ICD-10-CM | POA: Insufficient documentation

## 2012-02-11 DIAGNOSIS — I059 Rheumatic mitral valve disease, unspecified: Secondary | ICD-10-CM | POA: Insufficient documentation

## 2012-02-11 DIAGNOSIS — R011 Cardiac murmur, unspecified: Secondary | ICD-10-CM | POA: Insufficient documentation

## 2012-02-11 DIAGNOSIS — R609 Edema, unspecified: Secondary | ICD-10-CM

## 2012-02-11 NOTE — Progress Notes (Signed)
Echocardiogram performed.  

## 2012-03-11 ENCOUNTER — Ambulatory Visit (AMBULATORY_SURGERY_CENTER): Payer: Medicare Other | Admitting: *Deleted

## 2012-03-11 ENCOUNTER — Encounter: Payer: Self-pay | Admitting: Internal Medicine

## 2012-03-11 VITALS — Ht 63.0 in | Wt 155.0 lb

## 2012-03-11 DIAGNOSIS — Z1211 Encounter for screening for malignant neoplasm of colon: Secondary | ICD-10-CM

## 2012-03-11 MED ORDER — SUPREP BOWEL PREP KIT 17.5-3.13-1.6 GM/177ML PO SOLN: ORAL | Status: DC

## 2012-03-18 ENCOUNTER — Ambulatory Visit
Admission: RE | Admit: 2012-03-18 | Discharge: 2012-03-18 | Disposition: A | Discharge status: Home / Self Care | Payer: Medicare Other | Source: Ambulatory Visit | Attending: Internal Medicine | Admitting: Internal Medicine

## 2012-03-18 DIAGNOSIS — Z1231 Encounter for screening mammogram for malignant neoplasm of breast: Secondary | ICD-10-CM

## 2012-03-25 ENCOUNTER — Encounter: Payer: Medicare Other | Admitting: Internal Medicine

## 2012-04-09 ENCOUNTER — Encounter: Payer: Self-pay | Admitting: Internal Medicine

## 2012-04-09 ENCOUNTER — Ambulatory Visit (AMBULATORY_SURGERY_CENTER): Payer: Medicare Other | Admitting: Internal Medicine

## 2012-04-09 VITALS — BP 129/70 | HR 77 | Temp 98.4°F | Resp 16 | Ht 63.0 in | Wt 155.0 lb

## 2012-04-09 DIAGNOSIS — Z1211 Encounter for screening for malignant neoplasm of colon: Secondary | ICD-10-CM

## 2012-04-09 MED ORDER — SODIUM CHLORIDE 0.9 % IV SOLN: 500.0000 mL | INTRAVENOUS | Status: DC

## 2012-04-09 NOTE — Progress Notes (Signed)
Abdominal pressure applied to reach the cecum. 

## 2012-04-09 NOTE — Progress Notes (Signed)
Patient did not experience any of the following events: a burn prior to discharge; a fall within the facility; wrong site/side/patient/procedure/implant event; or a hospital transfer or hospital admission upon discharge from the facility. (G8907) Patient did not have preoperative order for IV antibiotic SSI prophylaxis. (G8918)  

## 2012-04-09 NOTE — Progress Notes (Signed)
Propofol given and oxygen managed per J Nulty CRNA 

## 2012-04-09 NOTE — Op Note (Signed)
[facility] [address] [place], [address]   COLONOSCOPY PROCEDURE REPORT  PATIENT: Dawn, Ashley  MR#: [id number] BIRTHDATE: [birth year] , 76  yrs. old GENDER: Female ENDOSCOPIST: Beverley Fiedler, MD REFERRED VH:[id number], Valerie PROCEDURE DATE:  04/09/2012 PROCEDURE:   Colonoscopy, screening ASA CLASS:   Class III INDICATIONS:average risk screening and last colonoscopy approximately 12 years ago. MEDICATIONS: MAC sedation, administered by CRNA, Fentanyl-Quick Pick, and Propofol (Diprivan) 260 mg IV  DESCRIPTION OF PROCEDURE:   After the risks benefits and alternatives of the procedure were thoroughly explained, informed consent was obtained.  A digital rectal exam revealed no rectal mass.   The LB CF-[id number]  endoscope was introduced through the anus and advanced to the cecum, which was identified by both the appendix and ileocecal valve. No adverse events experienced. The quality of the prep was Suprep good  The instrument was then slowly withdrawn as the colon was fully examined.    COLON FINDINGS: The colon was tortuous requiring manual pressure and changing to supine position.   The colon was otherwise normal. There was no diverticulosis, inflammation, polyps or cancers unless previously stated.  Retroflexed views revealed internal hemorrhoids.            The scope was withdrawn and the procedure completed.  COMPLICATIONS: There were no complications.  ENDOSCOPIC IMPRESSION: 1.   Normal colonic mucosa in a tortuous colon. 2.   Small internal hemorrhoids.  RECOMMENDATIONS: 1.   Given the findings today, no polyps, the recommended interval to repeat this examination is 10 years.  Given your age, you will not need another colonoscopy for colon cancer screening or polyp surveillance.  These types of tests usually stop around the age 75. This can be discussed further with your primary care doctor in the future.   eSigned:  Beverley Fiedler, MD  04/09/2012 2:08 PM   cc: Newt Lukes, MD and The Patient

## 2012-04-09 NOTE — Patient Instructions (Addendum)
YOU HAD AN ENDOSCOPIC PROCEDURE TODAY AT THE [facility]: Refer to the procedure report that was given to you for any specific questions about what was found during the examination.  If the procedure report does not answer your questions, please call your gastroenterologist to clarify.  If you requested that your care partner not be given the details of your procedure findings, then the procedure report has been included in a sealed envelope for you to review at your convenience later.  YOU SHOULD EXPECT: Some feelings of bloating in the abdomen. Passage of more gas than usual.  Walking can help get rid of the air that was put into your GI tract during the procedure and reduce the bloating. If you had a lower endoscopy (such as a colonoscopy or flexible sigmoidoscopy) you may notice spotting of blood in your stool or on the toilet paper. If you underwent a bowel prep for your procedure, then you may not have a normal bowel movement for a few days.  DIET: Your first meal following the procedure should be a light meal and then it is ok to progress to your normal diet.  A half-sandwich or bowl of soup is an example of a good first meal.  Heavy or fried foods are harder to digest and may make you feel nauseous or bloated.  Likewise meals heavy in dairy and vegetables can cause extra gas to form and this can also increase the bloating.  Drink plenty of fluids but you should avoid alcoholic beverages for 24 hours.  ACTIVITY: Your care partner should take you home directly after the procedure.  You should plan to take it easy, moving slowly for the rest of the day.  You can resume normal activity the day after the procedure however you should NOT DRIVE or use heavy machinery for 24 hours (because of the sedation medicines used during the test).    SYMPTOMS TO REPORT IMMEDIATELY: A gastroenterologist can be reached at any hour.  During normal business hours, 8:30 AM to 5:00 PM Monday through Friday,  call [phone].  After hours and on weekends, please call the GI answering service at [phone] who will take a message and have the physician on call contact you.   Following lower endoscopy (colonoscopy or flexible sigmoidoscopy):  Excessive amounts of blood in the stool  Significant tenderness or worsening of abdominal pains  Swelling of the abdomen that is new, acute  Fever of 100F or higher  Following upper endoscopy (EGD)  Vomiting of blood or coffee ground material  New chest pain or pain under the shoulder blades  Painful or persistently difficult swallowing  New shortness of breath  Fever of 100F or higher  Black, tarry-looking stools  FOLLOW UP: If any biopsies were taken you will be contacted by phone or by letter within the next 1-3 weeks.  Call your gastroenterologist if you have not heard about the biopsies in 3 weeks.  Our staff will call the home number listed on your records the next business day following your procedure to check on you and address any questions or concerns that you may have at that time regarding the information given to you following your procedure. This is a courtesy call and so if there is no answer at the home number and we have not heard from you through the emergency physician on call, we will assume that you have returned to your regular daily activities without incident.  SIGNATURES/CONFIDENTIALITY: You and/or your care   partner have signed paperwork which will be entered into your electronic medical record.  These signatures attest to the fact that that the information above on your After Visit Summary has been reviewed and is understood.  Full responsibility of the confidentiality of this discharge information lies with you and/or your care-partner.  

## 2012-04-12 ENCOUNTER — Telehealth: Payer: Self-pay | Admitting: *Deleted

## 2012-04-12 NOTE — Telephone Encounter (Signed)
  Follow up Call-  Call back number 04/09/2012  Post procedure Call Back phone  # [phone]  Permission to leave phone message Yes     Patient questions:  Message left to call us if necessary.

## 2012-04-28 ENCOUNTER — Other Ambulatory Visit: Payer: Self-pay | Admitting: Anesthesiology

## 2012-04-28 DIAGNOSIS — M545 Low back pain, unspecified: Secondary | ICD-10-CM

## 2012-05-03 ENCOUNTER — Ambulatory Visit
Admission: RE | Admit: 2012-05-03 | Discharge: 2012-05-03 | Disposition: A | Discharge status: Home / Self Care | Payer: Medicare Other | Source: Ambulatory Visit | Attending: Anesthesiology | Admitting: Anesthesiology

## 2012-05-03 DIAGNOSIS — M545 Low back pain, unspecified: Secondary | ICD-10-CM

## 2012-05-03 MED ORDER — GADOBENATE DIMEGLUMINE 529 MG/ML IV SOLN
14.0000 mL | Freq: Once | INTRAVENOUS | Status: AC | PRN
  Administered 2012-05-03: 14 mL via INTRAVENOUS

## 2012-06-06 ENCOUNTER — Emergency Department
Admission: EM | Admit: 2012-06-06 | Discharge: 2012-06-06 | Disposition: A | Discharge status: Home / Self Care | Payer: Medicare Other | Source: Home / Self Care | Attending: Family Medicine | Admitting: Family Medicine

## 2012-06-06 DIAGNOSIS — M545 Low back pain, unspecified: Secondary | ICD-10-CM

## 2012-06-06 MED ORDER — DICLOFENAC SODIUM 1 % TD GEL: TRANSDERMAL | Status: DC

## 2012-06-06 NOTE — ED Provider Notes (Signed)
History     CSN: [id number]  Arrival date & time 06/06/12  1103   First MD Initiated Contact with Patient 06/06/12 1121      Chief Complaint  Patient presents with  . Back Pain    worse for 3 days      HPI Comments: Patient has a history of chronic lower back pain.  She had a repeat MRI with and w/o contrast on 05/03/12 that showed no significant changes from previous.  Her pain is worse when bending over and walking; better when sitting. She has no difficulty sleeping.  No bowel or bladder dysfunction.  No radicular pain.  No saddle numbness.  Poor response to Celebrex and hydrocodone.  She had bilateral steroid injections on Oct 17 that helped initially, but pain now recurring.  Patient is a 76 y.o. female presenting with back pain. The history is provided by the patient and a relative.  Back Pain  This is a chronic problem. The current episode started more than 1 week ago. The problem occurs constantly. The problem has been gradually worsening. The pain is associated with no known injury. The pain is present in the lumbar spine and sacro-iliac joint. The quality of the pain is described as aching. The pain does not radiate. The pain is at a severity of 9/10. The pain is moderate. The symptoms are aggravated by certain positions. The pain is worse during the day. Pertinent negatives include no chest pain, no fever, no numbness, no weight loss, no headaches, no abdominal pain, no abdominal swelling, no bowel incontinence, no perianal numbness, no bladder incontinence, no dysuria, no pelvic pain, no leg pain, no paresthesias, no paresis, no tingling and no weakness. She has tried NSAIDs for the symptoms. The treatment provided no relief. Risk factors include menopause.    Past Medical History  Diagnosis Date  . Mitral valve prolapse   . Menopause   . Osteopenia   . Parkinson disease 01/2005  . Back pain   . Arthritis   . Displacement of lumbar intervertebral disc without myelopathy   .  Undiagnosed cardiac murmurs   . Kidney stones     Past Surgical History  Procedure Date  . Tonsillectomy   . Back surgery 2009    spurs resting on nerve  . Cataract extraction w/ intraocular lens  implant, bilateral 2011  2004  . Dilation and curettage of uterus 2012    Family History  Problem Relation Age of Onset  . Breast cancer Maternal Grandmother   . Arthritis Maternal Grandmother   . Hypertension Other   . Osteoporosis Other   . Breast cancer Mother     had mastectomy in 1982  . Heart disease Mother   . Stroke Mother     mild, 2001  . Hypertension Mother   . Osteoporosis Mother     also had spine surgery 1987, & hip repalcement  . Arthritis Sister   . Hypertension Sister   . Parkinsonism Maternal Uncle   . Arthritis Maternal Grandfather     History  Substance Use Topics  . Smoking status: Never Smoker   . Smokeless tobacco: Never Used  . Alcohol Use: 1.2 oz/week    2 Glasses of wine per week     Comment: glass of wine    OB History    Grav Para Term Preterm Abortions TAB SAB Ect Mult Living                  Review of  Systems  Constitutional: Negative for fever and weight loss.  Cardiovascular: Negative for chest pain.  Gastrointestinal: Negative for abdominal pain and bowel incontinence.  Genitourinary: Negative for bladder incontinence, dysuria and pelvic pain.  Musculoskeletal: Positive for back pain.  Neurological: Negative for tingling, weakness, numbness, headaches and paresthesias.  All other systems reviewed and are negative.    Allergies  Review of patient's allergies indicates no known allergies.  Home Medications   Current Outpatient Rx  Name  Route  Sig  Dispense  Refill  . ASPIRIN 81 MG PO TBEC   Oral   Take 81 mg by mouth daily.           Marland Kitchen CALCIUM CITRATE PO   Oral   Take 630 mg by mouth 2 (two) times daily.           Marland Kitchen VITAMIN D 2000 UNITS PO CAPS   Oral   Take 1 capsule (2,000 Units total) by mouth 2 (two) times  daily.   30 capsule      . ESTROGENS, CONJUGATED 0.625 MG/GM VA CREA   Vaginal   Place vaginally 2 (two) times a week.           Marland Kitchen HYDROCODONE-ACETAMINOPHEN 5-500 MG PO TABS   Oral   Take 1 tablet by mouth every 6 (six) hours as needed.         Marland Kitchen PRAMIPEXOLE DIHYDROCHLORIDE 0.5 MG PO TABS   Oral   Take 0.5 mg by mouth 3 (three) times daily.           Marland Kitchen RASAGILINE MESYLATE 0.5 MG PO TABS   Oral   Take 0.5 mg by mouth daily.           . CELEBREX 200 MG PO CAPS   Oral   Take 1 capsule by mouth Daily.         Marland Kitchen DICLOFENAC SODIUM 1 % TD GEL      Apply 4 gm to affected area three times daily.   100 g   1   . HYDROCHLOROTHIAZIDE 25 MG PO TABS   Oral   Take 1 tablet (25 mg total) by mouth daily.   90 tablet   3     BP 146/80  Pulse 81  Temp 98 F (36.7 C) (Oral)  Resp 18  Ht 5\' 3"  (1.6 m)  Wt 145 lb (65.772 kg)  BMI 25.69 kg/m2  SpO2 99%  Physical Exam Nursing notes and Vital Signs reviewed. Appearance:  Patient appears stated age, and in no acute distress Eyes:  Pupils are equal, round, and reactive to light and accomodation.  Extraocular movement is intact.  Conjunctivae are not inflamed   Neck:  Supple.  No adenopathy Lungs:  Clear to auscultation.  Breath sounds are equal.  Heart:  Regular rate and rhythm without murmurs, rubs, or gallops.  Abdomen:  Nontender without masses or hepatosplenomegaly.  Bowel sounds are present.  No CVA or flank tenderness.  Extremities:  No edema.  Skin:  No rash present.  Back:  Decreased range of motion, but can heel/toe walk and partially squat without difficulty.  No distinct tenderness to palpation.  She points to her lower lumbar and sacral area as a source of pain.   Straight leg raising test is negative.  Sitting knee extension test is negative.  Strength and sensation in the lower extremities is normal.  Patellar and achilles reflexes are normal  ED Course  Procedures  1. Low back pain     Reviewed  MRI LS spine w/wo contrast done 05/03/12: IMPRESSION:  1. Multilevel multifactorial lower lumbar spondylosis and stenosis  is stable since 2012, and includes moderate right lateral recess  stenosis at L5-S1 as detailed above.  2. Chronic left paracentral disc protrusion at T12-L1 appears  mildly increased. Minimal to mild spinal stenosis results.  3. Left lateral recess disc protrusion at L1-L2 appears regressed.  The left foraminal protrusion at the same level may be increased,  but there is no definite mass effect on the exiting left L1 nerve.  4. Interval resolved sacral fracture.    MDM  Trial of Voltaren gel Will refer to Dr. Rodney Langton for optimal management.  Patient could possibly benefit from a bisphosphonate, vitamin D levels, PT, etc.        Lattie Haw, MD 06/06/12 1229

## 2012-06-06 NOTE — ED Notes (Addendum)
Dawn Ashley has chronic back pain due to deteriorating bones in the spine. She received a trigger point injection 3 weeks ago with Sander Radon, [facility]. The pain has been worse for 3 days and is now a 9/10. She takes hydrocodone with little relief.

## 2012-06-07 ENCOUNTER — Encounter: Payer: Self-pay | Admitting: Sports Medicine

## 2012-06-07 ENCOUNTER — Telehealth: Payer: Self-pay

## 2012-06-07 ENCOUNTER — Ambulatory Visit (INDEPENDENT_AMBULATORY_CARE_PROVIDER_SITE_OTHER): Payer: Medicare Other | Admitting: Sports Medicine

## 2012-06-07 VITALS — BP 174/79 | HR 86 | Wt 147.0 lb

## 2012-06-07 DIAGNOSIS — M51369 Other intervertebral disc degeneration, lumbar region without mention of lumbar back pain or lower extremity pain: Secondary | ICD-10-CM

## 2012-06-07 DIAGNOSIS — M5136 Other intervertebral disc degeneration, lumbar region: Secondary | ICD-10-CM

## 2012-06-07 DIAGNOSIS — M51379 Other intervertebral disc degeneration, lumbosacral region without mention of lumbar back pain or lower extremity pain: Secondary | ICD-10-CM

## 2012-06-07 DIAGNOSIS — M5137 Other intervertebral disc degeneration, lumbosacral region: Secondary | ICD-10-CM

## 2012-06-07 MED ORDER — GABAPENTIN 300 MG PO CAPS: ORAL_CAPSULE | ORAL | Status: DC

## 2012-06-07 MED ORDER — TRAMADOL HCL 50 MG PO TABS: 50.0000 mg | ORAL_TABLET | Freq: Four times a day (QID) | ORAL | Status: DC | PRN

## 2012-06-07 NOTE — Progress Notes (Signed)
SPORTS MEDICINE CONSULTATION REPORT  Subjective:    I'm seeing this patient as a consultation for:  Dr. Cathren Harsh  CC: Low back pain  HPI: Dawn Ashley is a very pleasant 76 year old female with a very long history of low back pain. She has already had a laminectomy with microdiscectomy for left-sided radiculopathy at the L5-S1 level in the distant past. She returns today with approximately one month of pain that she notes is clearly different than her pain from before. Her current pain is localized in the lower lumbar spine predominantly on the right side. It is worsened with extension, and somewhat better with flexion. She does not feel that it is any worse with Valsalva. She denies any radiation down her legs, and no numbness, and no tingling. She denies any new bowel or bladder dysfunction. She also denies any constitutional symptoms. She was given some topical diclofenac which she feels is improving her symptoms. She has already had what sound to be several epidural injections without much benefit. The first injection did not her some, but subsequent injections have not. These were performed through [facility] neurosurgical. She was referred to me for consideration of nonsurgical management of her current symptoms.  Past medical history, Surgical history, Family history, Social history, Allergies, and medications have been entered into the medical record, reviewed, and no changes needed.   Review of Systems: No headache, visual changes, nausea, vomiting, diarrhea, constipation, dizziness, abdominal pain, skin rash, fevers, chills, night sweats, weight loss, swollen lymph nodes, body aches, joint swelling, muscle aches, chest pain, or shortness of breath.   Objective:   Vitals:  Afebrile, vital signs stable. General: Well Developed, well nourished, and in no acute distress.  Neuro/Psych: Alert and oriented x3, extra-ocular muscles intact, able to move all 4 extremities.  Skin: Warm and dry, no rashes noted.    Respiratory: Not using accessory muscles, speaking in full sentences, trachea midline.  Cardiovascular: Pulses palpable, no extremity edema. Abdomen: Does not appear distended. Back Exam:  Inspection: The patient stands with a very kyphotic posture.  Motion: Flexion 45 deg, Side Bending to 45 deg bilaterally,  Rotation to 45 deg bilaterally back pain is particularly exacerbated by extension, and significantly worse with extension and right rotation.  SLR laying: Negative  XSLR laying: Negative  Palpable tenderness: None. FABER: negative. Sensory change: Gross sensation intact to all lumbar and sacral dermatomes.  Reflexes: 2+ at both patellar tendons, 2+ at  right achilles tendons, 1+ at left Achilles,  Babinski's downgoing.  Strength at foot  Plantar-flexion: 5/5 Dorsi-flexion: 5/5 Eversion: 5/5 Inversion: 5/5  Leg strength  Quad: 5/5 Hamstring: 5/5 Hip flexor: 5/5 Hip abductors: 5/5  Gait unremarkable.   I did personally review her MRI which shows: 1. Multilevel multifactorial lower lumbar spondylosis and stenosis  is stable since 2012, and includes moderate right lateral recess  stenosis at L5-S1 as detailed above.  2. Chronic left paracentral disc protrusion at T12-L1 appears  mildly increased. Minimal to mild spinal stenosis results.  3. Left lateral recess disc protrusion at L1-L2 appears regressed.  The left foraminal protrusion at the same level may be increased,  but there is no definite mass effect on the exiting left L1 nerve.  She also has severe facet arthrosis at the L3-L4 level right worse than left, severe facet arthrosis at the L4-L5 level right significantly worse than the left, and severe facet arthrosis the L5-S1 level that's bilateral.   Impression and Recommendations:   This case required medical decision making of  moderate complexity.

## 2012-06-07 NOTE — Telephone Encounter (Signed)
She will start at 300mg  qHS, and up-taper herself.  Yes 300mg  4 tabs TID would take her to the max dose of 3600mg  a day, but I think its unlikely she will every get to this done.

## 2012-06-07 NOTE — Assessment & Plan Note (Addendum)
Pain with extension predominately on the right side suggestive of facet arthropathy. MRI shows multilevel facet disease. We'll start tramadol, and gabapentin up taper. I would also like her to do formal physical therapy. It sounds like they've all been epidurals, and that she has not yet had any facet joint injections. I would start on the right side at the L4-L5 level, and if ineffective will proceed with an additional diagnostic/therapeutic injection at the L5-S1 level on the right side. I see her back in 4 weeks, at that point I should have records from [facility] neurosurgery regarding what type of interventional injections she's had.

## 2012-06-07 NOTE — Telephone Encounter (Signed)
Pharmacist advised, see note below.

## 2012-06-07 NOTE — Telephone Encounter (Signed)
[facility] [place] pharmacist called to confirm the max dose patient can take of Gabapentin. E-Rx reads- 300 mg 4 tabs TID. So she can have max dose of 12 tablets daily.

## 2012-06-10 ENCOUNTER — Ambulatory Visit: Payer: Medicare Other | Attending: Sports Medicine | Admitting: Physical Therapy

## 2012-06-10 DIAGNOSIS — M545 Low back pain, unspecified: Secondary | ICD-10-CM | POA: Insufficient documentation

## 2012-06-10 DIAGNOSIS — IMO0001 Reserved for inherently not codable concepts without codable children: Secondary | ICD-10-CM | POA: Insufficient documentation

## 2012-06-10 DIAGNOSIS — M2560 Stiffness of unspecified joint, not elsewhere classified: Secondary | ICD-10-CM | POA: Insufficient documentation

## 2012-06-10 DIAGNOSIS — R293 Abnormal posture: Secondary | ICD-10-CM | POA: Insufficient documentation

## 2012-06-14 ENCOUNTER — Ambulatory Visit: Payer: Medicare Other | Admitting: Physical Therapy

## 2012-06-18 ENCOUNTER — Ambulatory Visit: Payer: Medicare Other | Admitting: Rehabilitation

## 2012-06-21 ENCOUNTER — Ambulatory Visit: Payer: Medicare Other | Admitting: Physical Therapy

## 2012-06-24 ENCOUNTER — Ambulatory Visit: Payer: Medicare Other | Admitting: Physical Therapy

## 2012-07-05 ENCOUNTER — Ambulatory Visit (INDEPENDENT_AMBULATORY_CARE_PROVIDER_SITE_OTHER): Payer: Medicare Other | Admitting: Sports Medicine

## 2012-07-05 ENCOUNTER — Encounter: Payer: Self-pay | Admitting: Sports Medicine

## 2012-07-05 VITALS — BP 134/75 | HR 89 | Wt 152.0 lb

## 2012-07-05 DIAGNOSIS — M51369 Other intervertebral disc degeneration, lumbar region without mention of lumbar back pain or lower extremity pain: Secondary | ICD-10-CM

## 2012-07-05 DIAGNOSIS — M5137 Other intervertebral disc degeneration, lumbosacral region: Secondary | ICD-10-CM

## 2012-07-05 DIAGNOSIS — M51379 Other intervertebral disc degeneration, lumbosacral region without mention of lumbar back pain or lower extremity pain: Secondary | ICD-10-CM

## 2012-07-05 DIAGNOSIS — M5136 Other intervertebral disc degeneration, lumbar region: Secondary | ICD-10-CM

## 2012-07-05 MED ORDER — DICLOFENAC SODIUM 1 % TD GEL: TRANSDERMAL | Status: DC

## 2012-07-05 NOTE — Assessment & Plan Note (Addendum)
Symptoms greatly improved, she is usually not in any pain. She does desire refills on Voltaren gel, she has enough gabapentin, and desires not to increase her dose past 300 mg 3 times a day. I would like to see her back in one month. She is currently still doing physical therapy.  She will look into her bone density exam history, and let us know.

## 2012-07-05 NOTE — Progress Notes (Signed)
SPORTS MEDICINE CONSULTATION REPORT  Subjective:    CC: Followup  HPI: Dawn Ashley comes back to see me for followup of low back pain. She has severe facet arthrosis at the L3-4, L4-5, and L5-S1 levels worse on the right side. I placed her on Voltaren gel, gabapentin, and formal physical therapy. Today she reports that her pain is almost gone. She's able to stand up straighter and is more active. The pain is localized, does not radiate. She desires refills on her Voltaren.  Past medical history, Surgical history, Family history, Social history, Allergies, and medications have been entered into the medical record, reviewed, and no changes needed.   Review of Systems: No headache, visual changes, nausea, vomiting, diarrhea, constipation, dizziness, abdominal pain, skin rash, fevers, chills, night sweats, weight loss, swollen lymph nodes, body aches, joint swelling, muscle aches, chest pain, shortness of breath, mood changes, visual or auditory hallucinations.   Objective:   Vitals:  Afebrile, vital signs stable. General: Well Developed, well nourished, and in no acute distress.  Neuro/Psych: Alert and oriented x3, extra-ocular muscles intact, able to move all 4 extremities.  Skin: Warm and dry, no rashes noted.  Respiratory: Not using accessory muscles, speaking in full sentences, trachea midline.  Cardiovascular: Pulses palpable, no extremity edema. Abdomen: Does not appear distended. Back Exam:  Inspection: Kyphoscoliosis present.  Motion: Flexion 45 deg, Extension 45 deg, Side Bending to 45 deg bilaterally,  Rotation to 45 deg bilaterally  SLR laying: Negative  XSLR laying: Negative  Palpable tenderness: None. FABER: negative. Sensory change: Gross sensation intact to all lumbar and sacral dermatomes.  Reflexes: 2+ at both patellar tendons, 2+ at achilles tendons, Babinski's downgoing.  Strength at foot  Plantar-flexion: 5/5 Dorsi-flexion: 5/5 Eversion: 5/5 Inversion: 5/5  Leg strength    Quad: 5/5 Hamstring: 5/5 Hip flexor: 5/5 Hip abductors: 5/5  Gait unremarkable.  Impression and Recommendations:   This case required medical decision making of moderate complexity.

## 2012-07-06 ENCOUNTER — Ambulatory Visit: Payer: Medicare Other | Attending: Sports Medicine | Admitting: Physical Therapy

## 2012-07-06 DIAGNOSIS — IMO0001 Reserved for inherently not codable concepts without codable children: Secondary | ICD-10-CM | POA: Insufficient documentation

## 2012-07-06 DIAGNOSIS — M545 Low back pain, unspecified: Secondary | ICD-10-CM | POA: Insufficient documentation

## 2012-07-06 DIAGNOSIS — R293 Abnormal posture: Secondary | ICD-10-CM | POA: Insufficient documentation

## 2012-07-06 DIAGNOSIS — M2560 Stiffness of unspecified joint, not elsewhere classified: Secondary | ICD-10-CM | POA: Insufficient documentation

## 2012-07-08 ENCOUNTER — Ambulatory Visit: Payer: Medicare Other | Admitting: Physical Therapy

## 2012-07-12 ENCOUNTER — Ambulatory Visit: Payer: Medicare Other | Admitting: Physical Therapy

## 2012-07-15 ENCOUNTER — Encounter: Payer: Medicare Other | Admitting: Physical Therapy

## 2012-07-19 ENCOUNTER — Ambulatory Visit (INDEPENDENT_AMBULATORY_CARE_PROVIDER_SITE_OTHER): Payer: Medicare Other | Admitting: Internal Medicine

## 2012-07-19 ENCOUNTER — Encounter: Payer: Self-pay | Admitting: Internal Medicine

## 2012-07-19 ENCOUNTER — Encounter: Payer: Medicare Other | Admitting: Physical Therapy

## 2012-07-19 VITALS — BP 142/82 | HR 96 | Temp 97.3°F | Wt 150.8 lb

## 2012-07-19 DIAGNOSIS — M899 Disorder of bone, unspecified: Secondary | ICD-10-CM

## 2012-07-19 DIAGNOSIS — M8580 Other specified disorders of bone density and structure, unspecified site: Secondary | ICD-10-CM

## 2012-07-19 DIAGNOSIS — M8080XA Other osteoporosis with current pathological fracture, unspecified site, initial encounter for fracture: Secondary | ICD-10-CM | POA: Insufficient documentation

## 2012-07-19 DIAGNOSIS — S81009A Unspecified open wound, unspecified knee, initial encounter: Secondary | ICD-10-CM

## 2012-07-19 DIAGNOSIS — G20 Parkinson's disease: Secondary | ICD-10-CM

## 2012-07-19 DIAGNOSIS — S91009A Unspecified open wound, unspecified ankle, initial encounter: Secondary | ICD-10-CM

## 2012-07-19 DIAGNOSIS — S81819A Laceration without foreign body, unspecified lower leg, initial encounter: Secondary | ICD-10-CM

## 2012-07-19 DIAGNOSIS — G20A1 Parkinson's disease without dyskinesia, without mention of fluctuations: Secondary | ICD-10-CM

## 2012-07-19 MED ORDER — ALENDRONATE SODIUM 70 MG PO TABS: 70.0000 mg | ORAL_TABLET | ORAL | Status: DC

## 2012-07-19 NOTE — Progress Notes (Signed)
  Subjective:    Patient ID: Dawn Ashley, female    DOB: September 23, 1935, 76 y.o.   MRN: [id number]  HPI here for follow up - accidental fall with injury to RLE shin Also ?need therapy for bone loss per ortho specialists  Also reviewed chronic medical issues today -   Parkinson's dz - dx 2006 - follows with neuro ([facility]) for same - no recent exac or progressive decline noted - the patient reports compliance with medication(s) as prescribed. Denies adverse side effects.  Lumbar DDD with episodic radiculopathy- reports LLE pain initially resolved with "bone spur surgery" 2009 - also intermittent RLE pain (sciatica) -improved with steroid shots ([facility] at [facility]) and now on NSAIDs - denies weakness or numbness - no falls or trauma. Improved pain with otc topical ointent (mineral ice) prn  White coat HTN - "always high at medical office" but normal elsewhere - no history ASD -no cad, cva or kidney dz - no headache, chest pain, weakness, vision change - mild BLE dependant edema 01/2012 so started HCTZ for same - the patient reports compliance with medication(s) as prescribed. Denies adverse side effects.   Past Medical History  Diagnosis Date  . Mitral valve prolapse   . Menopause   . Osteopenia   . Parkinson disease 01/2005  . Back pain   . Arthritis   . Displacement of lumbar intervertebral disc without myelopathy   . Undiagnosed cardiac murmurs   . Kidney stones      Review of Systems Constitutional: Negative for fever or unexpected wight change.  Respiratory: Negative for cough and shortness of breath.   Cardiovascular: Negative for chest pain.      Objective:   Physical Exam  BP 142/82  Pulse 96  Temp 97.3 F (36.3 C) (Oral)  Wt 150 lb 12.8 oz (68.402 kg)  SpO2 96% Wt Readings from Last 3 Encounters:  07/19/12 150 lb 12.8 oz (68.402 kg)  07/05/12 152 lb (68.947 kg)  06/07/12 147 lb (66.679 kg)   Constitutional: She appears well-developed and well-nourished. No distress.   son at side Neck: Normal range of motion. Neck supple. No JVD present. No thyromegaly present.  Cardiovascular: Normal rate, regular rhythm and normal heart sounds.  No murmur heard. No BLE edema but mildly "fatty" ankles Pulmonary/Chest: Effort normal and breath sounds normal. No respiratory distress. She has no wheezes. Skin: RLE with anterior shin laceration 1.5", healing - stage appropriate bruising -no drainage or signs infection Psychiatric: She has a normal mood and affect. Her behavior is normal. Judgment and thought content normal.    Lab Results  Component Value Date   WBC 6.4 01/21/2012   HGB 12.4 01/21/2012   HCT 37.3 01/21/2012   PLT 139.0* 01/21/2012   CHOL 192 02/10/2012   TRIG 29.0 02/10/2012   HDL 80.50 02/10/2012   ALT 21 01/21/2012   AST 30 01/21/2012   NA 143 01/21/2012   K 4.8 01/21/2012   CL 110 01/21/2012   CREATININE 0.9 01/21/2012   BUN 26* 01/21/2012   CO2 29 01/21/2012   TSH 2.74 01/21/2012        Assessment & Plan:  See problem list. Medications and labs reviewed today.  Laceration RLE, anterior shin - well approximated without sutures - No cellulitis or complication The patient is reassured that these symptoms do not appear to represent a serious or threatening condition. Conservative care and wound care advised: topical neosporin after cleansing and cover with gauze/paper tape

## 2012-07-19 NOTE — Assessment & Plan Note (Addendum)
DEXA 11/2010 with [facility]: -1.4 MRI L spine 01/2101 with incidental sacral insuff fracture Never on on bisphos - takes Ca + vit D Will start fosamax now Continue WB exercise and repeat DEXA 12/2012 to monitor

## 2012-07-19 NOTE — Assessment & Plan Note (Signed)
Follows with neuro for same - predominately affects LUE/hand The current medical regimen is effective;  continue present plan and medications.

## 2012-07-19 NOTE — Patient Instructions (Signed)
It was good to see you today. We have reviewed your prior records including labs and tests today Start Fosamax once weekly for your bones - Your prescription(s) have been submitted to your pharmacy. Please take as directed and contact our office if you believe you are having problem(s) with the medication(s). Please schedule followup in May 2014 (ok to cancel Jan 2014 appointment), call sooner if problems.

## 2012-07-22 ENCOUNTER — Encounter: Payer: Medicare Other | Admitting: Physical Therapy

## 2012-08-03 ENCOUNTER — Encounter: Payer: Self-pay | Admitting: Sports Medicine

## 2012-08-03 ENCOUNTER — Ambulatory Visit (INDEPENDENT_AMBULATORY_CARE_PROVIDER_SITE_OTHER): Payer: Medicare Other | Admitting: Sports Medicine

## 2012-08-03 ENCOUNTER — Ambulatory Visit (INDEPENDENT_AMBULATORY_CARE_PROVIDER_SITE_OTHER): Payer: Medicare Other

## 2012-08-03 VITALS — BP 153/80 | HR 87 | Wt 147.0 lb

## 2012-08-03 DIAGNOSIS — R0781 Pleurodynia: Secondary | ICD-10-CM

## 2012-08-03 DIAGNOSIS — M5136 Other intervertebral disc degeneration, lumbar region: Secondary | ICD-10-CM

## 2012-08-03 DIAGNOSIS — M51369 Other intervertebral disc degeneration, lumbar region without mention of lumbar back pain or lower extremity pain: Secondary | ICD-10-CM

## 2012-08-03 DIAGNOSIS — M5137 Other intervertebral disc degeneration, lumbosacral region: Secondary | ICD-10-CM

## 2012-08-03 DIAGNOSIS — R0789 Other chest pain: Secondary | ICD-10-CM | POA: Insufficient documentation

## 2012-08-03 DIAGNOSIS — M51379 Other intervertebral disc degeneration, lumbosacral region without mention of lumbar back pain or lower extremity pain: Secondary | ICD-10-CM

## 2012-08-03 DIAGNOSIS — R079 Chest pain, unspecified: Secondary | ICD-10-CM

## 2012-08-03 MED ORDER — GABAPENTIN 300 MG PO CAPS: ORAL_CAPSULE | ORAL | Status: DC

## 2012-08-03 MED ORDER — MELOXICAM 15 MG PO TABS: ORAL_TABLET | ORAL | Status: DC

## 2012-08-03 NOTE — Assessment & Plan Note (Signed)
Continues to improve with physical therapy. They would like another order to continue. I will refill gabapentin at 300 mg 3 times a day. Also going to add Mobic. I will see her back in 6 weeks.

## 2012-08-03 NOTE — Assessment & Plan Note (Signed)
Rib series. Mobic. RTC prn.

## 2012-08-03 NOTE — Progress Notes (Signed)
SPORTS MEDICINE CONSULTATION REPORT  Subjective:    CC: Followup back pain, rib pain  HPI: Rib pain: Dawn Ashley unfortunately had a trip and fall a few days ago. Since then she's had pain that she localizes over the tip of her left lower ribs. She denies any shortness of breath. Pain is worse with deep breathing, palpation, or with rest. Moderate, and does not radiate.  Low back pain: Significantly improved with the prior medication regimens. It is likely related to facet spondylosis. Tramadol was a little bit strong for her, and she is wondering if there's anything weaker she can use. She does like the gabapentin and needs a refill. She also notes impressive improvements with formal physical therapy is wondering if she can continue this.  Past medical history, Surgical history, Family history, Social history, Allergies, and medications have been entered into the medical record, reviewed, and no changes needed.   Review of Systems: No headache, visual changes, nausea, vomiting, diarrhea, constipation, dizziness, abdominal pain, skin rash, fevers, chills, night sweats, weight loss, swollen lymph nodes, body aches, joint swelling, muscle aches, chest pain, shortness of breath, mood changes, visual or auditory hallucinations.   Objective:   Vitals:  Afebrile, vital signs stable. General: Well Developed, well nourished, and in no acute distress.  Neuro/Psych: Alert and oriented x3, extra-ocular muscles intact, able to move all 4 extremities.  Skin: Warm and dry, no rashes noted.  Respiratory: Not using accessory muscles, speaking in full sentences, trachea midline.  Cardiovascular: Pulses palpable, no extremity edema. Abdomen: Does not appear distended. Back Exam:  Inspection: Kyphoscoliosis present.  Motion: Flexion 45 deg, Extension 45 deg, Side Bending to 45 deg bilaterally,  Rotation to 45 deg bilaterally  SLR laying: Negative  XSLR laying: Negative  Palpable tenderness: There is a discrete  area of tenderness over the left lower ribs. No step-offs or crepitation is felt. FABER: negative. Sensory change: Gross sensation intact to all lumbar and sacral dermatomes.  Reflexes: 2+ at both patellar tendons, 2+ at achilles tendons, Babinski's downgoing.  Strength at foot  Plantar-flexion: 5/5 Dorsi-flexion: 5/5 Eversion: 5/5 Inversion: 5/5  Leg strength  Quad: 5/5 Hamstring: 5/5 Hip flexor: 5/5 Hip abductors: 5/5  Gait unremarkable.   Impression and Recommendations:   This case required medical decision making of moderate complexity.

## 2012-08-08 ENCOUNTER — Emergency Department (INDEPENDENT_AMBULATORY_CARE_PROVIDER_SITE_OTHER): Payer: Medicare Other

## 2012-08-08 ENCOUNTER — Emergency Department
Admission: EM | Admit: 2012-08-08 | Discharge: 2012-08-08 | Disposition: A | Discharge status: Home / Self Care | Payer: Medicare Other | Source: Home / Self Care | Attending: Emergency Medicine | Admitting: Emergency Medicine

## 2012-08-08 ENCOUNTER — Encounter: Payer: Self-pay | Admitting: Emergency Medicine

## 2012-08-08 DIAGNOSIS — M79609 Pain in unspecified limb: Secondary | ICD-10-CM

## 2012-08-08 DIAGNOSIS — M25559 Pain in unspecified hip: Secondary | ICD-10-CM

## 2012-08-08 DIAGNOSIS — M79606 Pain in leg, unspecified: Secondary | ICD-10-CM

## 2012-08-08 NOTE — ED Notes (Signed)
Walking with aid of walker.

## 2012-08-08 NOTE — ED Notes (Signed)
Reports pain in anterior right thigh area; spontaneous x 2 days ago with no known injury.

## 2012-08-08 NOTE — ED Provider Notes (Signed)
History     CSN: [id number]  Arrival date & time 08/08/12  1249   First MD Initiated Contact with Patient 08/08/12 1440      Chief Complaint  Patient presents with  . Leg Pain    (Consider location/radiation/quality/duration/timing/severity/associated sxs/prior treatment) HPI Is a 77 year old white female who comes in today complaining of right anterior leg pain.  Has been on for about the last 2-3 days.  She does not have any known mechanism of injury, however she has been doing physical therapy for her back which has included leg extensions.  No fever, chills or other symptoms.  No chest pain or shortness of breath.  The pain is located only on the anterior aspect of her leg when she is straightening and her leg out And when she's apparently it.  She has not been using any medications for this other than her daily aspirin.  She describes as a fairly severe pain which is limiting her mobility.  She did fall a few weeks ago and saw her PCP for that which he does have some residual bruising on her lower leg and did have a rib contusion as well.  Past Medical History  Diagnosis Date  . Mitral valve prolapse   . Menopause   . Osteopenia     sacral insuff fx on MRI L spine 01/2011  . Parkinson disease 01/2005 dx  . Back pain   . Arthritis   . Displacement of lumbar intervertebral disc without myelopathy   . Kidney stones     Past Surgical History  Procedure Date  . Tonsillectomy   . Back surgery 2009    spurs resting on nerve  . Cataract extraction w/ intraocular lens  implant, bilateral 2011  2004  . Dilation and curettage of uterus 2012    Family History  Problem Relation Age of Onset  . Breast cancer Maternal Grandmother   . Arthritis Maternal Grandmother   . Hypertension Other   . Osteoporosis Other   . Breast cancer Mother     had mastectomy in 1982  . Heart disease Mother   . Stroke Mother     mild, 2001  . Hypertension Mother   . Osteoporosis Mother     also had  spine surgery 1987, & hip repalcement  . Arthritis Sister   . Hypertension Sister   . Parkinsonism Maternal Uncle   . Arthritis Maternal Grandfather     History  Substance Use Topics  . Smoking status: Never Smoker   . Smokeless tobacco: Never Used  . Alcohol Use: 1.2 oz/week    2 Glasses of wine per week     Comment: glass of wine    OB History    Grav Para Term Preterm Abortions TAB SAB Ect Mult Living                  Review of Systems  All other systems reviewed and are negative.    Allergies  Review of patient's allergies indicates no known allergies.  Home Medications   Current Outpatient Rx  Name  Route  Sig  Dispense  Refill  . ALENDRONATE SODIUM 70 MG PO TABS   Oral   Take 1 tablet (70 mg total) by mouth every 7 (seven) days. Take with a full glass of water on an empty stomach.   4 tablet   11   . ASPIRIN 81 MG PO TBEC   Oral   Take 81 mg by mouth daily.           Marland Kitchen  CALCIUM CITRATE PO   Oral   Take 630 mg by mouth 2 (two) times daily.           Marland Kitchen VITAMIN D 2000 UNITS PO CAPS   Oral   Take 1 capsule (2,000 Units total) by mouth 2 (two) times daily.   30 capsule      . ESTROGENS, CONJUGATED 0.625 MG/GM VA CREA   Vaginal   Place vaginally 2 (two) times a week.           Marland Kitchen DICLOFENAC SODIUM 1 % TD GEL      Apply 4 gm to affected area three times daily.   300 g   3   . GABAPENTIN 300 MG PO CAPS      One tab PO TID.   180 capsule   3   . HYDROCHLOROTHIAZIDE 25 MG PO TABS   Oral   Take 1 tablet (25 mg total) by mouth daily.   90 tablet   3   . MELOXICAM 15 MG PO TABS      One tab PO qAM with breakfast for 2 weeks, then daily prn pain.   30 tablet   3   . PRAMIPEXOLE DIHYDROCHLORIDE 0.5 MG PO TABS   Oral   Take 0.5 mg by mouth 3 (three) times daily.           Marland Kitchen RASAGILINE MESYLATE 0.5 MG PO TABS   Oral   Take 0.5 mg by mouth daily.             BP 151/76  Pulse 86  Temp 97.8 F (36.6 C) (Oral)  Resp 16  Ht 5'  3" (1.6 m)  Wt 148 lb (67.132 kg)  BMI 26.22 kg/m2  SpO2 99%  Physical Exam  Nursing note and vitals reviewed. Constitutional: She is oriented to person, place, and time. She appears well-developed and well-nourished.  HENT:  Head: Normocephalic and atraumatic.  Eyes: No scleral icterus.  Neck: Neck supple.  Cardiovascular: Regular rhythm and normal heart sounds.   Pulmonary/Chest: Effort normal and breath sounds normal. No respiratory distress.  Musculoskeletal:       Right lower extremity examination demonstrated full range of motion of the hip with no tenderness to palpation.  Logroll is normal.  Manipulate the hip itself also causes no pain.  She has some minor tenderness to palpation on her upper anterior thigh.  Active straightening of the leg with resistance does cause the pain that she has been experiencing as well as standing up on as well.  However passive range of motion is completely asymptomatic.  She has no signs whatsoever of any DVT.  She is no tenderness in her calf or her knee either.  Lateral aspect of her hip is also nontender.    Neurological: She is alert and oriented to person, place, and time.  Skin: Skin is warm and dry.  Psychiatric: She has a normal mood and affect. Her speech is normal.    ED Course  Procedures (including critical care time)  Labs Reviewed - No data to display Dg Hip Complete Right  08/08/2012  *RADIOLOGY REPORT*  Clinical Data: Right upper thigh and hip pain  RIGHT HIP - COMPLETE 2+ VIEW  Comparison: 12/21/2009  Findings: AP and frog-leg lateral projections of the proximal right femur are performed.  No fracture or dislocation. Visualization is suboptimal on the frontal projection without standard pelvis view.  IMPRESSION: No fracture or dislocation of the right hip.   Original Report  Authenticated By: Christiana Pellant, M.D.      1. Leg pain       MDM   An x-ray was obtained and read by the radiologist as above.  From her history and  examination, I do believe that she has a quad strain secondary to the physical therapy that she has been doing this week.  I really do not feel that this is any signs of a DVT other thrombophlebitis.  However the pain does get worse, she has any further symptoms such as fever or shortness of breath, she recovered immediately to the hospital and if the pain also does not resolve, then a ultrasound may be appropriate.  We are going to go ahead and get an x-ray today to look at both the hip as well as femur to see she is a bony pathology to this.  She started using a four-wheel walker which she will continue to use.  Encourage ice, rest, and gradual return to activity.  I would like her to followup with her regular doctor who is also sports medicine later this week to ensure that she is getting better.  She'll also need to adjust physical therapy regimen for this.   Marlaine Hind, MD 08/08/12 1556

## 2012-08-10 ENCOUNTER — Ambulatory Visit: Payer: Medicare Other | Admitting: Internal Medicine

## 2012-08-11 ENCOUNTER — Telehealth: Payer: Self-pay | Admitting: Emergency Medicine

## 2012-08-13 ENCOUNTER — Ambulatory Visit (INDEPENDENT_AMBULATORY_CARE_PROVIDER_SITE_OTHER): Payer: Medicare Other | Admitting: Sports Medicine

## 2012-08-13 ENCOUNTER — Encounter: Payer: Self-pay | Admitting: Sports Medicine

## 2012-08-13 VITALS — BP 146/77 | HR 85 | Wt 145.0 lb

## 2012-08-13 DIAGNOSIS — IMO0001 Reserved for inherently not codable concepts without codable children: Secondary | ICD-10-CM

## 2012-08-13 DIAGNOSIS — M791 Myalgia, unspecified site: Secondary | ICD-10-CM

## 2012-08-13 NOTE — Progress Notes (Signed)
Subjective:    CC: Followup  HPI: Dawn Ashley comes back to followup with an increasing amount of pain in her right groin. She's been working extensively with physical therapy, as well as doing exercises at home, while doing his straight leg raise she felt intense pain in her right groin and her right anterior thigh. She went to urgent care where x-rays were done that were negative. She was diagnosed with a quadriceps strain and sent to me for further evaluation. Pain is improved, but continues to be localized in the right groin and right anterior thigh without radiation. It is moderate, and worse with movement. She has absolutely zero pain when sitting still.  Past medical history, Surgical history, Family history, Social history, Allergies, and medications have been entered into the medical record, reviewed, and no changes needed.   Review of Systems: No fevers, chills, night sweats, weight loss, chest pain, or shortness of breath.   Objective:    General: Well Developed, well nourished, and in no acute distress.  Neuro: Alert and oriented x3, extra-ocular muscles intact, sensation grossly intact.  HEENT: Normocephalic, atraumatic, pupils equal round reactive to light, neck supple, no masses, no lymphadenopathy, thyroid nonpalpable.  Skin: Warm and dry, no rashes. Cardiac: Regular rate and rhythm, no murmurs rubs or gallops.  Respiratory: Clear to auscultation bilaterally. Not using accessory muscles, speaking in full sentences. Good strength no pain with knee extension, reproduction of pain and weakness to hip flexion. No tenderness to palpation over the entirety of the quadriceps.  Impression and Recommendations:

## 2012-08-13 NOTE — Assessment & Plan Note (Signed)
This will resolve on its own. I taught her some stretches. Continue Mobic. And continue formal physical therapy for her lumbar spine.

## 2012-08-17 ENCOUNTER — Telehealth: Payer: Self-pay | Admitting: *Deleted

## 2012-08-17 DIAGNOSIS — M51369 Other intervertebral disc degeneration, lumbar region without mention of lumbar back pain or lower extremity pain: Secondary | ICD-10-CM

## 2012-08-17 DIAGNOSIS — M5136 Other intervertebral disc degeneration, lumbar region: Secondary | ICD-10-CM

## 2012-08-17 NOTE — Telephone Encounter (Signed)
Pt calls & wants to know if she can receive in-home therapy.  Please advise

## 2012-08-17 NOTE — Telephone Encounter (Signed)
Worth a try, I will place a referral for [facility] for PT.

## 2012-08-20 ENCOUNTER — Encounter: Payer: Self-pay | Admitting: Internal Medicine

## 2012-08-20 ENCOUNTER — Ambulatory Visit (INDEPENDENT_AMBULATORY_CARE_PROVIDER_SITE_OTHER): Payer: 59 | Admitting: Internal Medicine

## 2012-08-20 VITALS — BP 130/78 | HR 80 | Temp 97.5°F | Resp 10 | Wt 141.1 lb

## 2012-08-20 DIAGNOSIS — M25559 Pain in unspecified hip: Secondary | ICD-10-CM

## 2012-08-20 DIAGNOSIS — M5137 Other intervertebral disc degeneration, lumbosacral region: Secondary | ICD-10-CM

## 2012-08-20 DIAGNOSIS — M8580 Other specified disorders of bone density and structure, unspecified site: Secondary | ICD-10-CM

## 2012-08-20 DIAGNOSIS — M79604 Pain in right leg: Secondary | ICD-10-CM

## 2012-08-20 DIAGNOSIS — M899 Disorder of bone, unspecified: Secondary | ICD-10-CM

## 2012-08-20 DIAGNOSIS — M79609 Pain in unspecified limb: Secondary | ICD-10-CM

## 2012-08-20 DIAGNOSIS — M25551 Pain in right hip: Secondary | ICD-10-CM

## 2012-08-20 DIAGNOSIS — M5136 Other intervertebral disc degeneration, lumbar region: Secondary | ICD-10-CM

## 2012-08-20 MED ORDER — TRAMADOL HCL 50 MG PO TABS: 50.0000 mg | ORAL_TABLET | Freq: Four times a day (QID) | ORAL | Status: DC | PRN

## 2012-08-20 NOTE — Progress Notes (Signed)
Subjective:    Patient ID: Dawn Ashley, female    DOB: 07-15-1936, 77 y.o.   MRN: [id number]  HPI  See CC - pain in R femur and R ischium with WB effort or walking x 1 week Increase pain symptoms causing progressive inability to walk in past week Experienced accidental fall 6-7 weeks ago (early 07/2012) - no pain at time of fall Slowly increasing pain in R anterior thigh and buttock over past 3 weeks -  Seen by sport med provider for same - dx with groin sprain and tx with NSAIDs, tramadol and muscle relaxer which have provided little/no pain relief. Pain acutely increased in past 6 days such to the point of NWB - requiring WC today for transport, prev ambulatory at 08/08/12 OV with sports med Denies recurrent fall or direct trauma since early 07/2012 fall  Also ?increase redness at site of R anterior shin laceration (eval here 07/19/12 for same) No fever, warmth, swelling or pain at laceration site No particular wound care ongoing at home at this time  Past Medical History  Diagnosis Date  . Mitral valve prolapse   . Menopause   . Osteopenia     sacral insuff fx on MRI L spine 01/2011  . Parkinson disease 01/2005 dx  . Back pain   . Arthritis   . Displacement of lumbar intervertebral disc without myelopathy   . Kidney stones     Review of Systems  Constitutional: Negative for fever and unexpected weight change.  Respiratory: Negative for cough and shortness of breath.   Genitourinary: Positive for pelvic pain. Negative for hematuria and flank pain.  Musculoskeletal: Positive for back pain (chronic w/o change) and gait problem. Negative for joint swelling.  Neurological: Positive for tremors (chronic w/o change). Negative for light-headedness and numbness.       Objective:   Physical Exam BP 130/78  Pulse 80  Temp 97.5 F (36.4 C) (Oral)  Resp 10  Wt 141 lb 1.9 oz (64.012 kg)  SpO2 96% Constitutional: She is sitting in WC, appears uncomfortable but well-developed and  well-nourished. Dtr and son at side.  Cardiovascular: Normal rate, regular rhythm and normal heart sounds.  No murmur heard. No BLE edema. Pulmonary/Chest: Effort normal and breath sounds normal. No respiratory distress. She has no wheezes.  Musculoskeletal: Pain with WB on RLE, primarily manifest in prox femur and R buttock. Reproducible pain with direct palpitation in each of these areas. No pain over R groin to palp. range of motion limited due to pain. No gross deformities. No enlargement or deformity of quad or hip flexors - ligamentous function intact Neurological: She is alert and oriented to person, place, and time. No cranial nerve deficit. Coordination normal.  Skin: chronic mild venous stasis color change BLE - no RLE celluliits - thickly scabbed laceration wound without signs infection or complication. Skin is warm and dry. No rash noted. No erythema.  Psychiatric: She has a normal mood and affect. Her behavior is normal. Judgment and thought content normal.   Lab Results  Component Value Date   WBC 6.4 01/21/2012   HGB 12.4 01/21/2012   HCT 37.3 01/21/2012   PLT 139.0* 01/21/2012   GLUCOSE 91 01/21/2012   CHOL 192 02/10/2012   TRIG 29.0 02/10/2012   HDL 80.50 02/10/2012   LDLCALC 106* 02/10/2012   ALT 21 01/21/2012   AST 30 01/21/2012   NA 143 01/21/2012   K 4.8 01/21/2012   CL 110 01/21/2012   CREATININE 0.9  01/21/2012   BUN 26* 01/21/2012   CO2 29 01/21/2012   TSH 2.74 01/21/2012   Dg Hip Complete Right  08/08/2012  *RADIOLOGY REPORT*  Clinical Data: Right upper thigh and hip pain  RIGHT HIP - COMPLETE 2+ VIEW  Comparison: 12/21/2009  Findings: AP and frog-leg lateral projections of the proximal right femur are performed.  No fracture or dislocation. Visualization is suboptimal on the frontal projection without standard pelvis view.  IMPRESSION: No fracture or dislocation of the right hip.   Original Report Authenticated By: Christiana Pellant, M.D.       Assessment & Plan:   R thigh pain x 3  weeks, progressively worse to the point of non WB x 1 week Pain R ischium/pelvis Osteoporosis with hx R sacral insuff fx  Fall early 07/2012 (approx 7 weeks ago)  Reviewed recent plain film - no overt fracture but high clinical suspicion of stress fracture based on hx Given multiple sites of pain, will check nuc med bone scan to look for occult fracture - consider MRI if needed based on these results [facility] for PT/OT/DME assistance Try tramadol again prn pain (previously not helpful for "sciatica pain but not used in past week with new femur/pelvic pain) - intol of stronger narcotics due to side effects  Continue tylenol scheduled

## 2012-08-20 NOTE — Assessment & Plan Note (Signed)
DEXA 11/2010 with [facility]: -1.4 MRI L spine 01/2101 with incidental sacral insuff fracture takes Ca + vit D - started fosamax 07/2012 Continue WB exercise and repeat DEXA 12/2012 to monitor See above re: ? Recurrent stress fx with RLE/pelvic pain

## 2012-08-20 NOTE — Patient Instructions (Signed)
It was good to see you today. We have reviewed your prior records including labs and tests today we'll make referral for bone scan to look for stress fracture causing pain. Our office will contact you regarding appointment(s) once made. also refer for home health to help with therapy and equipment needs Use tramadol as needed in addition to meloxicam daily and tylenol as needed - Your prescription(s) have been submitted to your local pharmacy. Please take as directed and contact our office if you believe you are having problem(s) with the medication(s). Next steps in treatment will depend on what we find and how you are doing

## 2012-08-23 ENCOUNTER — Telehealth: Payer: Self-pay | Admitting: Internal Medicine

## 2012-08-23 ENCOUNTER — Telehealth: Payer: Self-pay | Admitting: *Deleted

## 2012-08-23 DIAGNOSIS — S32010A Wedge compression fracture of first lumbar vertebra, initial encounter for closed fracture: Secondary | ICD-10-CM

## 2012-08-23 DIAGNOSIS — S32401A Unspecified fracture of right acetabulum, initial encounter for closed fracture: Secondary | ICD-10-CM

## 2012-08-23 NOTE — Telephone Encounter (Signed)
Dawn Ashley from [facility] went to patients home today to evaluate for services.  Dawn Ashley states the patient was in such severe pain and so conflexed (almost at a 90 degree angle when standing) that she could hardly walk or transfer for caller.  Dawn Ashley states it is under their policy that they cannot pick patient up for services because she should placed someplace where she can have supervision or have family stay with patient.  Pt is not safe in this environment (and pt lives alone).  OFFICE PLEASE FOLLOW UP WITH CALLERS REQUEST REGARDING HELP/SUPERVISION/NOTIFICATION OF FAMILY FOR PATIENT.

## 2012-08-23 NOTE — Telephone Encounter (Signed)
Shoot both of them back a call to see if his company can do a home health eval for consideration of an aide.  Any orders should ideally be sent to Dr. Felicity Coyer, her PCP.

## 2012-08-23 NOTE — Telephone Encounter (Signed)
Physical therapist calls and states they received orders to do in home eval and pt with this patient. He went to home today and states that the patients home environment is unsafe and that they will NOT be picking her up for PT as the home is not safe for the patient to be living there by herself, could not get out of home if needed to if was hurt or fire. Any questions can call him back

## 2012-08-24 ENCOUNTER — Encounter (HOSPITAL_COMMUNITY)
Admission: RE | Admit: 2012-08-24 | Discharge: 2012-08-24 | Disposition: A | Discharge status: Home / Self Care | Payer: Medicare Other | Source: Ambulatory Visit | Attending: Internal Medicine | Admitting: Internal Medicine

## 2012-08-24 DIAGNOSIS — M25559 Pain in unspecified hip: Secondary | ICD-10-CM | POA: Insufficient documentation

## 2012-08-24 DIAGNOSIS — M949 Disorder of cartilage, unspecified: Secondary | ICD-10-CM | POA: Insufficient documentation

## 2012-08-24 DIAGNOSIS — M79609 Pain in unspecified limb: Secondary | ICD-10-CM | POA: Insufficient documentation

## 2012-08-24 DIAGNOSIS — M5136 Other intervertebral disc degeneration, lumbar region: Secondary | ICD-10-CM

## 2012-08-24 DIAGNOSIS — M79604 Pain in right leg: Secondary | ICD-10-CM

## 2012-08-24 DIAGNOSIS — M899 Disorder of bone, unspecified: Secondary | ICD-10-CM | POA: Insufficient documentation

## 2012-08-24 DIAGNOSIS — M5137 Other intervertebral disc degeneration, lumbosacral region: Secondary | ICD-10-CM | POA: Insufficient documentation

## 2012-08-24 DIAGNOSIS — M8580 Other specified disorders of bone density and structure, unspecified site: Secondary | ICD-10-CM

## 2012-08-24 DIAGNOSIS — M51379 Other intervertebral disc degeneration, lumbosacral region without mention of lumbar back pain or lower extremity pain: Secondary | ICD-10-CM | POA: Insufficient documentation

## 2012-08-24 DIAGNOSIS — M25551 Pain in right hip: Secondary | ICD-10-CM

## 2012-08-24 MED ORDER — TECHNETIUM TC 99M MEDRONATE IV KIT
25.0000 | PACK | Freq: Once | INTRAVENOUS | Status: AC | PRN
  Administered 2012-08-24: 25 via INTRAVENOUS

## 2012-08-24 NOTE — Telephone Encounter (Signed)
Patient Information:  Caller Name: Elijah Birk  Phone: [phone]  Patient: Dawn Ashley, Dawn Ashley  Gender: Female  DOB: 10/31/1935  Age: 77 Years  PCP: Rene Paci (Adults only)  Office Follow Up:  Does the office need to follow up with this patient?: Yes  Instructions For The Office: Please call son/ Elijah Birk about next step to take.  Should he bring her to office for reevaluation or does he need to take her to ED since she fell - pain rated at 6 of 10. He requests assistance getting  Home Health "or the next step up".   Symptoms  Reason For Call & Symptoms: Difficulty walking - estimated 2 weeks.  Having Bone Density done 08/24/12.  Fell during the night.  Unable to bear weight well and drags foot - for estimated 2 weeks.  Reviewed Health History In EMR: Yes  Reviewed Medications In EMR: Yes  Reviewed Allergies In EMR: Yes  Reviewed Surgeries / Procedures: Yes  Date of Onset of Symptoms: 08/10/2012  Guideline(s) Used:  Back Pain  Disposition Per Guideline:   Go to ED Now (or to Office with PCP Approval)  Reason For Disposition Reached:   Weakness of a leg or foot (e.g., unable to bear weight, dragging foot)  Advice Given:  Activity  Avoid anything that makes your pain worse. Avoid heavy lifting, twisting, and too much exercise until your back heals.  Call Back If:  You have more questions  You become worse.

## 2012-08-24 NOTE — Telephone Encounter (Signed)
Spoke with joey from [facility] & he's already contacted her pcp.

## 2012-08-24 NOTE — Telephone Encounter (Signed)
Notified pt son (Tom)with md response. Son states mom is coming in this afternoon to have bone scan wanting to see if md can pull prelim report and get results because mom is not able to walk and needing assistant ASAP...Raechel Chute

## 2012-08-24 NOTE — Telephone Encounter (Signed)
Ongoing nuc bone scan today - what we do next depends on what we find on this scan... After i review the results, we will decide on the next best symptoms - more to come - thanks

## 2012-08-25 ENCOUNTER — Encounter: Payer: Self-pay | Admitting: Internal Medicine

## 2012-08-25 ENCOUNTER — Other Ambulatory Visit: Payer: Self-pay | Admitting: Orthopedic Surgery

## 2012-08-25 DIAGNOSIS — M25551 Pain in right hip: Secondary | ICD-10-CM

## 2012-08-25 DIAGNOSIS — S32010A Wedge compression fracture of first lumbar vertebra, initial encounter for closed fracture: Secondary | ICD-10-CM | POA: Insufficient documentation

## 2012-08-25 NOTE — Telephone Encounter (Signed)
Call to both Tom's # and [name] at work and cell - niether answered -  Bone scan shows right acetabular fracture and new L1 compression fracture. Each of these findings explain patient's pain symptoms. Needs repeat films to verify that pelvic fracture remains nondisplaced and imaging of lumbar spine. Will refer to orthopedics for evaluation of this and assistance with management of pain.

## 2012-08-25 NOTE — Telephone Encounter (Signed)
Msg left on vm from Ed Ruthven returning call back to md. Pls call him @ [phone]...lmb

## 2012-08-25 NOTE — Telephone Encounter (Signed)
Spoke with Ed re: results and plans for mgmt - to see ortho [facility] today at 2p - Family considering SNF if unable to ambulate to help with care needs - i will help as needed, support offered

## 2012-08-26 ENCOUNTER — Other Ambulatory Visit: Payer: Self-pay | Admitting: Internal Medicine

## 2012-08-26 ENCOUNTER — Telehealth: Payer: Self-pay | Admitting: *Deleted

## 2012-08-26 DIAGNOSIS — R269 Unspecified abnormalities of gait and mobility: Secondary | ICD-10-CM

## 2012-08-26 NOTE — Telephone Encounter (Signed)
Left msg on vm requesting order for Bed side commode to be fax to [phone]...Raechel Chute

## 2012-08-26 NOTE — Telephone Encounter (Signed)
Valentina Gu, RX done, placed on your desk to be faxed. Rene Kocher

## 2012-08-26 NOTE — Telephone Encounter (Signed)
Called Don bck no answer LMOM order fax to [facility]...Raechel Chute

## 2012-08-30 ENCOUNTER — Telehealth: Payer: Self-pay | Admitting: Internal Medicine

## 2012-08-30 ENCOUNTER — Ambulatory Visit
Admission: RE | Admit: 2012-08-30 | Discharge: 2012-08-30 | Disposition: A | Discharge status: Home / Self Care | Payer: Medicare Other | Source: Ambulatory Visit | Attending: Orthopedic Surgery | Admitting: Orthopedic Surgery

## 2012-08-30 DIAGNOSIS — M25551 Pain in right hip: Secondary | ICD-10-CM

## 2012-08-30 MED ORDER — GADOBENATE DIMEGLUMINE 529 MG/ML IV SOLN
13.0000 mL | Freq: Once | INTRAVENOUS | Status: AC | PRN
  Administered 2012-08-30: 13 mL via INTRAVENOUS

## 2012-08-30 NOTE — Telephone Encounter (Signed)
[facility] Triage Call Report Triage Record Num: [id number] Operator: Frederico Hamman Patient Name: Dawn Ashley Call Date & Time: 08/27/2012 5:19:31PM Patient Phone: [phone] PCP: Rene Paci Patient Gender: Female PCP Fax : [phone] Patient DOB: 1935-08-11 Practice Name: [facility] Reason for Call: Caller: [name]; PCP: Rene Paci (Adults only); CB#: [phone]; Call regarding Tramadol; [name] from [facility] states [name] is taking Azilect and Tramadol. [name] was ordered Tramadol on 08/20/12 due to leg pain. c/o pelvic and hip pain since a fall on 08/24/11. [name] states her reference states Azilect and Tramadol are not recommended to be taken together. Per Medscape both meds given together can increase Serotonin levels. Dr. Ermalene Searing notified. Verbal order given that Tramadol taken as needed on a short term basis while taking Azilect is acceptable. Verbal order given to [name] who stated she will notify [name]. Protocol(s) Used: Medication Questions - Adult Recommended Outcome per Protocol: Speak with Provider or Pharmacist within 24 hours Reason for Outcome: Older adult with questions about prescribed and/or nonprescribed medications not covered by available resources

## 2012-09-02 ENCOUNTER — Telehealth: Payer: Self-pay | Admitting: Internal Medicine

## 2012-09-02 NOTE — Telephone Encounter (Signed)
Called son back he states mom was told to take tramadol 50 mg TID. Per our records we have her taking 1 every 6 hours. Advise son to take 1/2 -1 of tramadol to see how that works for her. If not better to f/u with Dr. Felicity Coyer...Raechel Chute

## 2012-09-02 NOTE — Telephone Encounter (Signed)
Patient's son Ed would like to speak with someone because he feels the pain mediation, Tramadol, is causing his mother to be very sleepy and groggy

## 2012-09-07 ENCOUNTER — Telehealth: Payer: Self-pay | Admitting: *Deleted

## 2012-09-07 NOTE — Telephone Encounter (Signed)
Noted, thanks -we will review at her office visit

## 2012-09-07 NOTE — Telephone Encounter (Signed)
Wanted to let pt know pt is coming in tomorrow for f/u appt. The family is concern about pt sleeping a lot. Falling asleep when she eat. Went over meds with pt...Raechel Chute

## 2012-09-09 ENCOUNTER — Other Ambulatory Visit: Payer: Self-pay | Admitting: Neurology

## 2012-09-09 DIAGNOSIS — S72009A Fracture of unspecified part of neck of unspecified femur, initial encounter for closed fracture: Secondary | ICD-10-CM

## 2012-09-09 DIAGNOSIS — R269 Unspecified abnormalities of gait and mobility: Secondary | ICD-10-CM

## 2012-09-09 DIAGNOSIS — F29 Unspecified psychosis not due to a substance or known physiological condition: Secondary | ICD-10-CM

## 2012-09-09 DIAGNOSIS — W19XXXA Unspecified fall, initial encounter: Secondary | ICD-10-CM

## 2012-09-13 ENCOUNTER — Telehealth: Payer: Self-pay | Admitting: *Deleted

## 2012-09-13 DIAGNOSIS — R627 Adult failure to thrive: Secondary | ICD-10-CM

## 2012-09-13 DIAGNOSIS — M79604 Pain in right leg: Secondary | ICD-10-CM | POA: Insufficient documentation

## 2012-09-13 DIAGNOSIS — R269 Unspecified abnormalities of gait and mobility: Secondary | ICD-10-CM | POA: Insufficient documentation

## 2012-09-13 NOTE — Telephone Encounter (Signed)
Faxed last ov notes & Xray & MRI...lmb

## 2012-09-13 NOTE — Telephone Encounter (Signed)
Notified Diane rx [facility] to [facility]...Raechel Chute

## 2012-09-13 NOTE — Telephone Encounter (Signed)
Ok to generate rx to signe and fax - ICD codes: gait disorder, pain right leg, FTT-adut

## 2012-09-13 NOTE — Telephone Encounter (Signed)
Left msg on vm stating received script for wheel chair, but need additional information height, weight, clinical notes for reason she need wheel chair...Raechel Chute

## 2012-09-13 NOTE — Telephone Encounter (Signed)
Left msg on vm family is requesting a wheelchair. Need order fax to [facility] if ok...Raechel Chute

## 2012-09-14 ENCOUNTER — Ambulatory Visit
Admission: RE | Admit: 2012-09-14 | Discharge: 2012-09-14 | Disposition: A | Discharge status: Home / Self Care | Payer: Medicare Other | Source: Ambulatory Visit | Attending: Orthopedic Surgery | Admitting: Orthopedic Surgery

## 2012-09-14 ENCOUNTER — Ambulatory Visit: Payer: Medicare Other | Admitting: Sports Medicine

## 2012-09-14 ENCOUNTER — Other Ambulatory Visit: Payer: Self-pay | Admitting: Orthopedic Surgery

## 2012-09-14 ENCOUNTER — Other Ambulatory Visit: Payer: Medicare Other

## 2012-09-14 DIAGNOSIS — R52 Pain, unspecified: Secondary | ICD-10-CM

## 2012-09-14 NOTE — H&P (Signed)
Dawn Ashley is an 77 y.o. female.   Chief Complaint: Right hip pain HPI: Patient is seen today in consultation from Dr. Lunette Stands for what appears to be a right hip femoral head fracture with probable superior dislocation of the femoral head into the acetabulum with a periacetabular fracture.  Her first and only visit with Dr. Charlett Blake was on 08/25/2012 she is complaining of right hip and groin pain after number falls in the bone scan showed intense uptake in the acetabulum consistent with a nondisplaced fracture.  Since that time she is had another fall out of bed with increased pain in the right hip and x-rays today, at Dr. Eilleen Kempf office that showed superior femoral head fracture with migration of the femoral head superiorly.  Past Medical History  Diagnosis Date  . Mitral valve prolapse   . Menopause   . Osteopenia     sacral insuff fx on MRI L spine 01/2011  . Parkinson disease 01/2005 dx  . Back pain   . Arthritis   . Displacement of lumbar intervertebral disc without myelopathy   . Kidney stones   . Osteoporosis with fracture     Past Surgical History  Procedure Laterality Date  . Tonsillectomy    . Back surgery  2009    spurs resting on nerve  . Cataract extraction w/ intraocular lens  implant, bilateral  2011  2004  . Dilation and curettage of uterus  2012    Family History  Problem Relation Age of Onset  . Breast cancer Maternal Grandmother   . Arthritis Maternal Grandmother   . Hypertension Other   . Osteoporosis Other   . Breast cancer Mother     had mastectomy in 1982  . Heart disease Mother   . Stroke Mother     mild, 2001  . Hypertension Mother   . Osteoporosis Mother     also had spine surgery 1987, & hip repalcement  . Arthritis Sister   . Hypertension Sister   . Parkinsonism Maternal Uncle   . Arthritis Maternal Grandfather    Social History:  reports that she has never smoked. She has never used smokeless tobacco. She reports that she drinks about 1.2  ounces of alcohol per week. She reports that she does not use illicit drugs.  Allergies: No Known Allergies  No prescriptions prior to admission    No results found for this or any previous visit (from the past 48 hour(s)). Ct Pelvis Wo Contrast  09/14/2012  *RADIOLOGY REPORT*  Clinical Data: Right hip pain status post fall 1 month ago. Acetabular and femoral head fractures on MRI.  Planned total hip arthroplasty.  CT PELVIS WITHOUT CONTRAST  Technique:  Multidetector CT imaging of the pelvis was performed following the standard protocol without intravenous contrast.  Comparison: Right hip MRI 08/30/2012, whole body bone scan 08/24/2012, right hip radiographs 08/08/2012 and pelvic radiographs 01/06/2012.  Lumbar MRI 05/03/2012.  Findings: As demonstrated on the recent studies, there is a comminuted fracture of the right acetabulum superolaterally with superolateral displacement of a 3.9 cm osseous fragment.  There is a large impaction fracture of the femoral head anteriorly impacting the anterior third of the femoral head.  There are multiple small fracture fragments within the joint as well as extensive heterotopic ossification.  There is diffuse erosion of the acetabulum. The femoral head is superiorly migrated by approximately 2 cm and also demonstrates mild anterolateral subluxation.  Motion artifact is felt to account for suggested irregularity of the  base of the femoral neck (best seen on coronal image 51).  No femoral neck or intertrochanteric fracture is identified.  Remote fractures of the left superior and inferior pubic rami are again noted.  Although these have not substantially changed from the prior radiographs, both fractures demonstrate incomplete bridging and persistent fracture lines suspicious for chronic nonunion.  In addition, there is evidence of underlying bilateral sacral insufficiency fractures.  These do not appear grossly changed from the MRI which showed no significant sacral  edema. Therefore, these may be chronic.  There are mild sacroiliac degenerative changes bilaterally.  Advanced degenerative disc disease is again noted at L5-S1 with vacuum phenomenon and an associated anterolisthesis.  The associated right greater than left foraminal stenosis does not appear significantly changed.  Lesser foraminal narrowing at L4-L5 also appears stable.  IMPRESSION:  1. Comminuted and displaced fracture of the right acetabulum with large impaction fracture involving the right femoral head anteriorly. 2.  Associated diffuse acetabular erosion and extensive heterotopic ossification.  Could the patient have a neuropathic joint? 3.  Chronic fractures of the left superior and inferior pubic rami with incomplete osseous union. Bilateral sacral sufficiency fractures, also likely chronic.   Original Report Authenticated By: Carey Bullocks, M.D.     Review of Systems  Constitutional: Positive for weight loss.  HENT: Negative.   Eyes: Negative.   Respiratory: Negative.   Cardiovascular: Negative.   Gastrointestinal: Negative.   Genitourinary: Negative.   Musculoskeletal: Positive for joint pain and falls.  Neurological: Negative.   Endo/Heme/Allergies: Negative.   Psychiatric/Behavioral: Negative.     There were no vitals taken for this visit. Physical Exam  Constitutional: She is oriented to person, place, and time. She appears well-developed and well-nourished.  HENT:  Head: Normocephalic.  Eyes: Pupils are equal, round, and reactive to light.  Cardiovascular: Normal heart sounds.   Respiratory: Effort normal.  Musculoskeletal:       Right hip: She exhibits decreased range of motion and tenderness.  Neurological: She is alert and oriented to person, place, and time.  Psychiatric: She has a normal mood and affect.     Assessment/Plan Assess: Multiple falls, probably secondary to Parkinson's disease, with superior fracture subluxation of the right femoral head into the  acetabulum.  Plan: CT scan is been accomplished showing a superior notch and the femoral head, superior acetabular fracture with intact anterior and posterior columns as well as centrally.  Essentially the femoral head is been for 7 to the acetabulum.  I believe that we can use her on the femoral head as a graft that we can screw into the acetabulum and then ream over this placing a standard acetabular component with multiple screw holes.  On the femoral side.  I would use a hybrid stem.  If the acetabular component is unstable.  I discussed the possibility of a Girdlestone procedure with the patient and her son.  We'll try to get this accomplished sometime later this week, probably on Friday.  The risks and benefits of surgery discussed at length.   Aslin Farinas M. 09/14/2012, 3:30 PM

## 2012-09-15 ENCOUNTER — Encounter (HOSPITAL_COMMUNITY): Admission: RE | Admit: 2012-09-15 | Payer: Medicare Other | Source: Ambulatory Visit

## 2012-09-15 ENCOUNTER — Telehealth: Payer: Self-pay | Admitting: *Deleted

## 2012-09-15 ENCOUNTER — Encounter (HOSPITAL_COMMUNITY)
Admission: RE | Admit: 2012-09-15 | Discharge: 2012-09-15 | Disposition: A | Discharge status: Home / Self Care | Payer: Medicare Other | Source: Ambulatory Visit | Attending: Orthopedic Surgery | Admitting: Orthopedic Surgery

## 2012-09-15 ENCOUNTER — Encounter (HOSPITAL_COMMUNITY): Payer: Self-pay

## 2012-09-15 DIAGNOSIS — M949 Disorder of cartilage, unspecified: Secondary | ICD-10-CM

## 2012-09-15 DIAGNOSIS — Z01818 Encounter for other preprocedural examination: Secondary | ICD-10-CM | POA: Insufficient documentation

## 2012-09-15 DIAGNOSIS — M79609 Pain in unspecified limb: Secondary | ICD-10-CM

## 2012-09-15 DIAGNOSIS — M5137 Other intervertebral disc degeneration, lumbosacral region: Secondary | ICD-10-CM

## 2012-09-15 DIAGNOSIS — M25559 Pain in unspecified hip: Secondary | ICD-10-CM

## 2012-09-15 DIAGNOSIS — Z01812 Encounter for preprocedural laboratory examination: Secondary | ICD-10-CM | POA: Insufficient documentation

## 2012-09-15 DIAGNOSIS — S32401A Unspecified fracture of right acetabulum, initial encounter for closed fracture: Secondary | ICD-10-CM

## 2012-09-15 DIAGNOSIS — M899 Disorder of bone, unspecified: Secondary | ICD-10-CM

## 2012-09-15 HISTORY — DX: Cardiac murmur, unspecified: R01.1

## 2012-09-15 LAB — CBC WITH DIFFERENTIAL/PLATELET
Basophils Relative: 1 % (ref 0–1)
Eosinophils Absolute: 0.3 10*3/uL (ref 0.0–0.7)
Hemoglobin: 11.3 g/dL — ABNORMAL LOW (ref 12.0–15.0)
MCH: 29.2 pg (ref 26.0–34.0)
MCHC: 32 g/dL (ref 30.0–36.0)
Monocytes Relative: 10 % (ref 3–12)
Neutrophils Relative %: 58 % (ref 43–77)

## 2012-09-15 LAB — BASIC METABOLIC PANEL
BUN: 40 mg/dL — ABNORMAL HIGH (ref 6–23)
Calcium: 9.4 mg/dL (ref 8.4–10.5)
GFR calc non Af Amer: 58 mL/min — ABNORMAL LOW (ref >90)
Glucose, Bld: 91 mg/dL (ref 70–99)
Potassium: 4 mEq/L (ref 3.5–5.1)

## 2012-09-15 LAB — URINE MICROSCOPIC-ADD ON

## 2012-09-15 LAB — URINALYSIS, ROUTINE W REFLEX MICROSCOPIC
Nitrite: NEGATIVE
Specific Gravity, Urine: 1.034 — ABNORMAL HIGH (ref 1.005–1.030)
pH: 5.5 (ref 5.0–8.0)

## 2012-09-15 LAB — SURGICAL PCR SCREEN
MRSA, PCR: NEGATIVE
Staphylococcus aureus: NEGATIVE

## 2012-09-15 LAB — PROTIME-INR: INR: 0.97 (ref 0.00–1.49)

## 2012-09-15 NOTE — Pre-Procedure Instructions (Signed)
Dawn Ashley  09/15/2012   Your procedure is scheduled on: 09/17/2012  Report to [facility] at 7:15 AM.  Call this number if you have problems the morning of surgery: [phone]   Remember:   Do not eat food or drink liquids after midnight. THURSDAY   Take these medicines the morning of surgery with A SIP OF WATER: GABAPENTIN, MIRAPEX, AZILECT   Do not wear jewelry, make-up or nail polish.  Do not wear lotions, powders, or perfumes. You may wear deodorant.  Do not shave 48 hours prior to surgery.   Do not bring valuables to the hospital.  Contacts, dentures or bridgework may not be worn into surgery.  Leave suitcase in the car. After surgery it may be brought to your room.  For patients admitted to the hospital, checkout time is 11:00 AM the day of  discharge.   Patients discharged the day of surgery will not be allowed to drive  home.  Name and phone number of your driver: /w SON  Special Instructions: Shower using CHG 2 nights before surgery and the night before surgery.  If you shower the day of surgery use CHG.  Use special wash - you have one bottle of CHG for all showers.  You should use approximately 1/3 of the bottle for each shower.   Please read over the following fact sheets that you were given: Pain Booklet, Coughing and Deep Breathing, Blood Transfusion Information, Total Joint Packet, MRSA Information and Surgical Site Infection Prevention

## 2012-09-15 NOTE — Telephone Encounter (Signed)
Ok to generate order for same - (?why not verbal ok?)

## 2012-09-15 NOTE — Progress Notes (Signed)
Pt. Falling asleep several times throughout the interview.  Pt. Reports that she was awake at 0300 & couldn't go back to sleep.  Pt. Denies chest pain, difficulty breathing & /or SOB. Last ekg in [facility] as well as last CXR & Echo done in 2013

## 2012-09-15 NOTE — Telephone Encounter (Signed)
Order generated, awaiting MD's signature. Order faxed to [facility] at fax # below.

## 2012-09-15 NOTE — Telephone Encounter (Signed)
[facility] NURSE CALLED, NANCY WALKER, STATES THAT PATIENT SON  STATED PATIENT IS TO HAVE HIP SURGERY AND ALL HOME HEALTH SERVICES ARE TO BE PUT ON HOLD. NURSE NANCY WALKER STATES NEED A ORDER FAXED TO OFFICE TO [facility]. FAX # [phone].

## 2012-09-16 LAB — URINE CULTURE: Culture: NO GROWTH

## 2012-09-19 MED ORDER — CHLORHEXIDINE GLUCONATE 4 % EX LIQD: 60.0000 mL | Freq: Once | CUTANEOUS | Status: DC

## 2012-09-19 MED ORDER — DEXTROSE-NACL 5-0.45 % IV SOLN: INTRAVENOUS | Status: DC

## 2012-09-19 MED ORDER — CEFAZOLIN SODIUM-DEXTROSE 2-3 GM-% IV SOLR
2.0000 g | INTRAVENOUS | Status: AC
  Administered 2012-09-20: 2 g via INTRAVENOUS
  Filled 2012-09-19: qty 50

## 2012-09-20 ENCOUNTER — Encounter (HOSPITAL_COMMUNITY)
Admission: RE | Disposition: A | Discharge status: Skilled Nursing Facility | Payer: Self-pay | Source: Ambulatory Visit | Attending: Orthopedic Surgery

## 2012-09-20 ENCOUNTER — Inpatient Hospital Stay (HOSPITAL_COMMUNITY)
Admission: RE | Admit: 2012-09-20 | Discharge: 2012-09-23 | DRG: 956 | Disposition: A | Discharge status: Skilled Nursing Facility | Payer: Medicare Other | Source: Ambulatory Visit | Attending: Orthopedic Surgery | Admitting: Orthopedic Surgery

## 2012-09-20 ENCOUNTER — Encounter (HOSPITAL_COMMUNITY): Payer: Self-pay | Admitting: *Deleted

## 2012-09-20 ENCOUNTER — Inpatient Hospital Stay (HOSPITAL_COMMUNITY): Payer: Medicare Other | Admitting: Certified Registered"

## 2012-09-20 ENCOUNTER — Encounter (HOSPITAL_COMMUNITY): Payer: Self-pay | Admitting: Certified Registered"

## 2012-09-20 ENCOUNTER — Inpatient Hospital Stay (HOSPITAL_COMMUNITY): Payer: Medicare Other

## 2012-09-20 DIAGNOSIS — D62 Acute posthemorrhagic anemia: Secondary | ICD-10-CM | POA: Diagnosis not present

## 2012-09-20 DIAGNOSIS — M5126 Other intervertebral disc displacement, lumbar region: Secondary | ICD-10-CM | POA: Diagnosis present

## 2012-09-20 DIAGNOSIS — M810 Age-related osteoporosis without current pathological fracture: Secondary | ICD-10-CM | POA: Diagnosis present

## 2012-09-20 DIAGNOSIS — S72033A Displaced midcervical fracture of unspecified femur, initial encounter for closed fracture: Principal | ICD-10-CM | POA: Diagnosis present

## 2012-09-20 DIAGNOSIS — M129 Arthropathy, unspecified: Secondary | ICD-10-CM | POA: Diagnosis present

## 2012-09-20 DIAGNOSIS — W06XXXA Fall from bed, initial encounter: Secondary | ICD-10-CM | POA: Diagnosis present

## 2012-09-20 DIAGNOSIS — Z01812 Encounter for preprocedural laboratory examination: Secondary | ICD-10-CM

## 2012-09-20 DIAGNOSIS — S32401A Unspecified fracture of right acetabulum, initial encounter for closed fracture: Secondary | ICD-10-CM

## 2012-09-20 DIAGNOSIS — I059 Rheumatic mitral valve disease, unspecified: Secondary | ICD-10-CM | POA: Diagnosis present

## 2012-09-20 DIAGNOSIS — G20A1 Parkinson's disease without dyskinesia, without mention of fluctuations: Secondary | ICD-10-CM | POA: Diagnosis present

## 2012-09-20 DIAGNOSIS — I10 Essential (primary) hypertension: Secondary | ICD-10-CM | POA: Diagnosis present

## 2012-09-20 DIAGNOSIS — G20 Parkinson's disease: Secondary | ICD-10-CM | POA: Diagnosis present

## 2012-09-20 DIAGNOSIS — S32409A Unspecified fracture of unspecified acetabulum, initial encounter for closed fracture: Secondary | ICD-10-CM | POA: Diagnosis present

## 2012-09-20 HISTORY — PX: ORIF PELVIC FRACTURE: SHX2128

## 2012-09-20 HISTORY — PX: TOTAL HIP ARTHROPLASTY: SHX124

## 2012-09-20 LAB — HEMOGLOBIN AND HEMATOCRIT, BLOOD
HCT: 28 % — ABNORMAL LOW (ref 36.0–46.0)
Hemoglobin: 9.2 g/dL — ABNORMAL LOW (ref 12.0–15.0)

## 2012-09-20 LAB — GRAM STAIN: Gram Stain: NONE SEEN

## 2012-09-20 SURGERY — ARTHROPLASTY, HIP, TOTAL,POSTERIOR APPROACH
Anesthesia: General | Site: Pelvis | Laterality: Right | Wound class: Clean

## 2012-09-20 MED ORDER — KCL IN DEXTROSE-NACL 20-5-0.45 MEQ/L-%-% IV SOLN
INTRAVENOUS | Status: AC
  Filled 2012-09-20: qty 1000

## 2012-09-20 MED ORDER — GLYCOPYRROLATE 0.2 MG/ML IJ SOLN
INTRAMUSCULAR | Status: DC | PRN
  Administered 2012-09-20: 0.4 mg via INTRAVENOUS

## 2012-09-20 MED ORDER — ONDANSETRON HCL 4 MG/2ML IJ SOLN: 4.0000 mg | Freq: Once | INTRAMUSCULAR | Status: DC | PRN

## 2012-09-20 MED ORDER — ONDANSETRON HCL 4 MG/2ML IJ SOLN: 4.0000 mg | Freq: Four times a day (QID) | INTRAMUSCULAR | Status: DC | PRN

## 2012-09-20 MED ORDER — HYDROCHLOROTHIAZIDE 25 MG PO TABS
25.0000 mg | ORAL_TABLET | Freq: Every day | ORAL | Status: DC
  Administered 2012-09-21 – 2012-09-23 (×3): 25 mg via ORAL
  Filled 2012-09-20 (×5): qty 1

## 2012-09-20 MED ORDER — HYDROMORPHONE HCL PF 1 MG/ML IJ SOLN: 0.2500 mg | INTRAMUSCULAR | Status: DC | PRN

## 2012-09-20 MED ORDER — PHENOL 1.4 % MT LIQD: 1.0000 | OROMUCOSAL | Status: DC | PRN

## 2012-09-20 MED ORDER — ROCURONIUM BROMIDE 100 MG/10ML IV SOLN
INTRAVENOUS | Status: DC | PRN
  Administered 2012-09-20: 50 mg via INTRAVENOUS

## 2012-09-20 MED ORDER — HYDROMORPHONE HCL PF 1 MG/ML IJ SOLN
1.0000 mg | INTRAMUSCULAR | Status: DC | PRN
  Administered 2012-09-20: 1 mg via INTRAVENOUS
  Filled 2012-09-20: qty 1

## 2012-09-20 MED ORDER — BUPIVACAINE-EPINEPHRINE (PF) 0.5% -1:200000 IJ SOLN
INTRAMUSCULAR | Status: AC
  Filled 2012-09-20: qty 10

## 2012-09-20 MED ORDER — ACETAMINOPHEN 10 MG/ML IV SOLN
1000.0000 mg | Freq: Four times a day (QID) | INTRAVENOUS | Status: AC
  Administered 2012-09-20 – 2012-09-21 (×4): 1000 mg via INTRAVENOUS
  Filled 2012-09-20 (×8): qty 100

## 2012-09-20 MED ORDER — ALBUMIN HUMAN 5 % IV SOLN
INTRAVENOUS | Status: DC | PRN
  Administered 2012-09-20: 12:00:00 via INTRAVENOUS

## 2012-09-20 MED ORDER — BUPIVACAINE-EPINEPHRINE 0.5% -1:200000 IJ SOLN
INTRAMUSCULAR | Status: DC | PRN
  Administered 2012-09-20: 20 mL

## 2012-09-20 MED ORDER — DIPHENHYDRAMINE HCL 12.5 MG/5ML PO ELIX: 12.5000 mg | ORAL_SOLUTION | ORAL | Status: DC | PRN

## 2012-09-20 MED ORDER — PRAMIPEXOLE DIHYDROCHLORIDE 0.25 MG PO TABS
0.5000 mg | ORAL_TABLET | Freq: Three times a day (TID) | ORAL | Status: DC
  Administered 2012-09-20 – 2012-09-23 (×9): 0.5 mg via ORAL
  Filled 2012-09-20 (×12): qty 2

## 2012-09-20 MED ORDER — MAGNESIUM HYDROXIDE 400 MG/5ML PO SUSP: 30.0000 mL | Freq: Every day | ORAL | Status: DC | PRN

## 2012-09-20 MED ORDER — ALUM & MAG HYDROXIDE-SIMETH 200-200-20 MG/5ML PO SUSP: 30.0000 mL | ORAL | Status: DC | PRN

## 2012-09-20 MED ORDER — ASPIRIN EC 325 MG PO TBEC
325.0000 mg | DELAYED_RELEASE_TABLET | Freq: Two times a day (BID) | ORAL | Status: DC
  Administered 2012-09-20 – 2012-09-23 (×6): 325 mg via ORAL
  Filled 2012-09-20 (×7): qty 1

## 2012-09-20 MED ORDER — ACETAMINOPHEN 650 MG RE SUPP: 650.0000 mg | Freq: Four times a day (QID) | RECTAL | Status: DC | PRN

## 2012-09-20 MED ORDER — OXYCODONE HCL 5 MG PO TABS
5.0000 mg | ORAL_TABLET | ORAL | Status: DC | PRN
  Administered 2012-09-21 – 2012-09-22 (×3): 10 mg via ORAL
  Administered 2012-09-22: 5 mg via ORAL
  Filled 2012-09-20 (×3): qty 2
  Filled 2012-09-20: qty 1

## 2012-09-20 MED ORDER — ZOLPIDEM TARTRATE 5 MG PO TABS: 5.0000 mg | ORAL_TABLET | Freq: Every evening | ORAL | Status: DC | PRN

## 2012-09-20 MED ORDER — CEFUROXIME SODIUM 1.5 G IJ SOLR
INTRAMUSCULAR | Status: DC | PRN
  Administered 2012-09-20: 1.5 g

## 2012-09-20 MED ORDER — SODIUM CHLORIDE 0.9 % IR SOLN
Status: DC | PRN
  Administered 2012-09-20: 1000 mL
  Administered 2012-09-20: 3000 mL

## 2012-09-20 MED ORDER — LACTATED RINGERS IV SOLN
INTRAVENOUS | Status: DC
  Administered 2012-09-20: 10:00:00 via INTRAVENOUS

## 2012-09-20 MED ORDER — ALENDRONATE SODIUM 70 MG PO TABS: 70.0000 mg | ORAL_TABLET | ORAL | Status: DC

## 2012-09-20 MED ORDER — CALCIUM CARBONATE-VITAMIN D 500-200 MG-UNIT PO TABS
1.0000 | ORAL_TABLET | Freq: Every day | ORAL | Status: DC
  Administered 2012-09-21 – 2012-09-23 (×3): 1 via ORAL
  Filled 2012-09-20 (×3): qty 1

## 2012-09-20 MED ORDER — PROPOFOL 10 MG/ML IV BOLUS
INTRAVENOUS | Status: DC | PRN
  Administered 2012-09-20: 200 mg via INTRAVENOUS

## 2012-09-20 MED ORDER — MIDAZOLAM HCL 5 MG/5ML IJ SOLN
INTRAMUSCULAR | Status: DC | PRN
  Administered 2012-09-20: 2 mg via INTRAVENOUS

## 2012-09-20 MED ORDER — METOCLOPRAMIDE HCL 10 MG PO TABS: 5.0000 mg | ORAL_TABLET | Freq: Three times a day (TID) | ORAL | Status: DC | PRN

## 2012-09-20 MED ORDER — METHOCARBAMOL 100 MG/ML IJ SOLN
500.0000 mg | Freq: Four times a day (QID) | INTRAVENOUS | Status: DC | PRN
  Filled 2012-09-20: qty 5

## 2012-09-20 MED ORDER — CALCIUM CARBONATE-VITAMIN D 250-125 MG-UNIT PO TABS: 1.0000 | ORAL_TABLET | Freq: Two times a day (BID) | ORAL | Status: DC

## 2012-09-20 MED ORDER — OXYCODONE HCL 5 MG PO TABS: 5.0000 mg | ORAL_TABLET | Freq: Once | ORAL | Status: DC | PRN

## 2012-09-20 MED ORDER — METHOCARBAMOL 500 MG PO TABS
500.0000 mg | ORAL_TABLET | Freq: Four times a day (QID) | ORAL | Status: DC | PRN
  Filled 2012-09-20: qty 1

## 2012-09-20 MED ORDER — KCL IN DEXTROSE-NACL 20-5-0.45 MEQ/L-%-% IV SOLN
INTRAVENOUS | Status: DC
  Administered 2012-09-20 – 2012-09-21 (×3): via INTRAVENOUS
  Filled 2012-09-20 (×10): qty 1000

## 2012-09-20 MED ORDER — ONDANSETRON HCL 4 MG PO TABS: 4.0000 mg | ORAL_TABLET | Freq: Four times a day (QID) | ORAL | Status: DC | PRN

## 2012-09-20 MED ORDER — NEOSTIGMINE METHYLSULFATE 1 MG/ML IJ SOLN
INTRAMUSCULAR | Status: DC | PRN
  Administered 2012-09-20: 3 mg via INTRAVENOUS

## 2012-09-20 MED ORDER — GABAPENTIN 300 MG PO CAPS
300.0000 mg | ORAL_CAPSULE | Freq: Three times a day (TID) | ORAL | Status: DC
  Administered 2012-09-20 – 2012-09-23 (×7): 300 mg via ORAL
  Filled 2012-09-20 (×12): qty 1

## 2012-09-20 MED ORDER — FENTANYL CITRATE 0.05 MG/ML IJ SOLN
INTRAMUSCULAR | Status: DC | PRN
  Administered 2012-09-20: 100 ug via INTRAVENOUS
  Administered 2012-09-20: 25 ug via INTRAVENOUS

## 2012-09-20 MED ORDER — VITAMIN D3 25 MCG (1000 UNIT) PO TABS
4000.0000 [IU] | ORAL_TABLET | Freq: Every day | ORAL | Status: DC
  Administered 2012-09-21 – 2012-09-23 (×3): 4000 [IU] via ORAL
  Filled 2012-09-20 (×3): qty 4

## 2012-09-20 MED ORDER — ESTROGENS, CONJUGATED 0.625 MG/GM VA CREA: 1.0000 g | TOPICAL_CREAM | VAGINAL | Status: DC

## 2012-09-20 MED ORDER — CEFAZOLIN SODIUM-DEXTROSE 2-3 GM-% IV SOLR
2.0000 g | Freq: Once | INTRAVENOUS | Status: AC
  Administered 2012-09-20: 2 g via INTRAVENOUS
  Filled 2012-09-20: qty 50

## 2012-09-20 MED ORDER — FLEET ENEMA 7-19 GM/118ML RE ENEM: 1.0000 | ENEMA | Freq: Once | RECTAL | Status: AC | PRN

## 2012-09-20 MED ORDER — MENTHOL 3 MG MT LOZG: 1.0000 | LOZENGE | OROMUCOSAL | Status: DC | PRN

## 2012-09-20 MED ORDER — PRAMIPEXOLE DIHYDROCHLORIDE 0.25 MG PO TABS: 0.5000 mg | ORAL_TABLET | Freq: Three times a day (TID) | ORAL | Status: DC

## 2012-09-20 MED ORDER — LIDOCAINE HCL (CARDIAC) 20 MG/ML IV SOLN
INTRAVENOUS | Status: DC | PRN
  Administered 2012-09-20: 80 mg via INTRAVENOUS

## 2012-09-20 MED ORDER — OXYCODONE HCL 5 MG/5ML PO SOLN: 5.0000 mg | Freq: Once | ORAL | Status: DC | PRN

## 2012-09-20 MED ORDER — CEFUROXIME SODIUM 1.5 G IJ SOLR
INTRAMUSCULAR | Status: AC
  Filled 2012-09-20: qty 1.5

## 2012-09-20 MED ORDER — RASAGILINE MESYLATE 1 MG PO TABS
1.0000 mg | ORAL_TABLET | Freq: Every day | ORAL | Status: DC
  Administered 2012-09-21 – 2012-09-23 (×3): 1 mg via ORAL
  Filled 2012-09-20 (×3): qty 1

## 2012-09-20 MED ORDER — ACETAMINOPHEN 325 MG PO TABS: 650.0000 mg | ORAL_TABLET | Freq: Four times a day (QID) | ORAL | Status: DC | PRN

## 2012-09-20 MED ORDER — BISACODYL 5 MG PO TBEC: 5.0000 mg | DELAYED_RELEASE_TABLET | Freq: Every day | ORAL | Status: DC | PRN

## 2012-09-20 MED ORDER — CELECOXIB 200 MG PO CAPS
200.0000 mg | ORAL_CAPSULE | Freq: Two times a day (BID) | ORAL | Status: DC
  Administered 2012-09-21 – 2012-09-23 (×3): 200 mg via ORAL
  Filled 2012-09-20 (×7): qty 1

## 2012-09-20 MED ORDER — LACTATED RINGERS IV SOLN
INTRAVENOUS | Status: DC | PRN
  Administered 2012-09-20 (×2): via INTRAVENOUS

## 2012-09-20 MED ORDER — VITAMIN D 50 MCG (2000 UT) PO TABS: 4000.0000 [IU] | ORAL_TABLET | Freq: Every day | ORAL | Status: DC

## 2012-09-20 MED ORDER — PHENYLEPHRINE HCL 10 MG/ML IJ SOLN
INTRAMUSCULAR | Status: DC | PRN
  Administered 2012-09-20 (×5): 40 ug via INTRAVENOUS
  Administered 2012-09-20: 80 ug via INTRAVENOUS
  Administered 2012-09-20 (×3): 40 ug via INTRAVENOUS
  Administered 2012-09-20: 80 ug via INTRAVENOUS
  Administered 2012-09-20: 40 ug via INTRAVENOUS
  Administered 2012-09-20 (×2): 80 ug via INTRAVENOUS

## 2012-09-20 MED ORDER — ONDANSETRON HCL 4 MG/2ML IJ SOLN
INTRAMUSCULAR | Status: DC | PRN
  Administered 2012-09-20: 4 mg via INTRAVENOUS

## 2012-09-20 MED ORDER — ESTROGENS, CONJUGATED 0.625 MG/GM VA CREA
1.0000 | TOPICAL_CREAM | VAGINAL | Status: DC
  Filled 2012-09-20: qty 42.5

## 2012-09-20 MED ORDER — METOCLOPRAMIDE HCL 5 MG/ML IJ SOLN: 5.0000 mg | Freq: Three times a day (TID) | INTRAMUSCULAR | Status: DC | PRN

## 2012-09-20 SURGICAL SUPPLY — 79 items
BLADE SAW SAG 73X25 THK (BLADE) ×1
BLADE SAW SGTL 73X25 THK (BLADE) ×1 IMPLANT
BOWL SMART MIX CTS (DISPOSABLE) IMPLANT
BRUSH FEMORAL CANAL (MISCELLANEOUS) ×2 IMPLANT
CEMENT BONE DEPUY (Cement) ×4 IMPLANT
CEMENT RESTRICTOR DEPUY SZ 3 (Cement) ×2 IMPLANT
CLOTH BEACON ORANGE TIMEOUT ST (SAFETY) ×4 IMPLANT
COVER MAYO STAND STRL (DRAPES) ×2 IMPLANT
COVER SURGICAL LIGHT HANDLE (MISCELLANEOUS) ×4 IMPLANT
CUP ACETAB 52MM (Orthopedic Implant) ×2 IMPLANT
DRAPE C-ARM 42X72 X-RAY (DRAPES) ×2 IMPLANT
DRAPE ORTHO SPLIT 77X108 STRL (DRAPES) ×4
DRAPE PROXIMA HALF (DRAPES) ×8 IMPLANT
DRAPE SURG ORHT 6 SPLT 77X108 (DRAPES) ×3 IMPLANT
DRAPE U-SHAPE 47X51 STRL (DRAPES) ×4 IMPLANT
DRILL BIT 7/64X5 (BIT) ×4 IMPLANT
DRSG MEPILEX BORDER 4X12 (GAUZE/BANDAGES/DRESSINGS) ×2 IMPLANT
DRSG MEPILEX BORDER 4X8 (GAUZE/BANDAGES/DRESSINGS) ×4 IMPLANT
DURAPREP 26ML APPLICATOR (WOUND CARE) ×4 IMPLANT
ELECT BLADE 4.0 EZ CLEAN MEGAD (MISCELLANEOUS) ×4
ELECT BLADE 6.5 EXT (BLADE) IMPLANT
ELECT REM PT RETURN 9FT ADLT (ELECTROSURGICAL) ×4
ELECTRODE BLDE 4.0 EZ CLN MEGD (MISCELLANEOUS) ×1 IMPLANT
ELECTRODE REM PT RTRN 9FT ADLT (ELECTROSURGICAL) ×3 IMPLANT
EVACUATOR 1/8 PVC DRAIN (DRAIN) IMPLANT
GAUZE XEROFORM 5X9 LF (GAUZE/BANDAGES/DRESSINGS) ×2 IMPLANT
GLOVE BIO SURGEON STRL SZ 6 (GLOVE) ×2 IMPLANT
GLOVE BIO SURGEON STRL SZ7 (GLOVE) ×4 IMPLANT
GLOVE BIO SURGEON STRL SZ7.5 (GLOVE) ×6 IMPLANT
GLOVE BIOGEL PI IND STRL 6.5 (GLOVE) ×1 IMPLANT
GLOVE BIOGEL PI IND STRL 7.0 (GLOVE) ×3 IMPLANT
GLOVE BIOGEL PI IND STRL 8 (GLOVE) ×4 IMPLANT
GLOVE BIOGEL PI INDICATOR 6.5 (GLOVE) ×1
GLOVE BIOGEL PI INDICATOR 7.0 (GLOVE) ×1
GLOVE BIOGEL PI INDICATOR 8 (GLOVE) ×2
GLOVE ECLIPSE 7.0 STRL STRAW (GLOVE) ×2 IMPLANT
GLOVE ECLIPSE 7.5 STRL STRAW (GLOVE) ×2 IMPLANT
GLOVE SURG SS PI 7.5 STRL IVOR (GLOVE) ×4 IMPLANT
GOWN PREVENTION PLUS XLARGE (GOWN DISPOSABLE) ×10 IMPLANT
GOWN SRG XL XLNG 56XLVL 4 (GOWN DISPOSABLE) ×1 IMPLANT
GOWN STRL NON-REIN LRG LVL3 (GOWN DISPOSABLE) ×8 IMPLANT
GOWN STRL NON-REIN XL XLG LVL4 (GOWN DISPOSABLE) ×4
HANDPIECE INTERPULSE COAX TIP (DISPOSABLE) ×4
HEEL PROTECTOR  874200 (MISCELLANEOUS)
HEEL PROTECTOR 874200 (MISCELLANEOUS) IMPLANT
HOOD PEEL AWAY FACE SHEILD DIS (HOOD) ×10 IMPLANT
KIT BASIN OR (CUSTOM PROCEDURE TRAY) ×4 IMPLANT
KIT ROOM TURNOVER OR (KITS) ×4 IMPLANT
MANIFOLD NEPTUNE II (INSTRUMENTS) ×4 IMPLANT
NEEDLE 22X1 1/2 (OR ONLY) (NEEDLE) ×4 IMPLANT
NOZZLE PRISM 8.5MM (MISCELLANEOUS) ×2 IMPLANT
NS IRRIG 1000ML POUR BTL (IV SOLUTION) ×6 IMPLANT
PACK TOTAL JOINT (CUSTOM PROCEDURE TRAY) ×4 IMPLANT
PAD ARMBOARD 7.5X6 YLW CONV (MISCELLANEOUS) ×8 IMPLANT
PASSER SUT SWANSON 36MM LOOP (INSTRUMENTS) ×2 IMPLANT
PRESSURIZER FEMORAL UNIV (MISCELLANEOUS) ×2 IMPLANT
SET HNDPC FAN SPRY TIP SCT (DISPOSABLE) ×1 IMPLANT
SLEEVE SURGEON STRL (DRAPES) IMPLANT
SPONGE GAUZE 4X4 12PLY (GAUZE/BANDAGES/DRESSINGS) ×2 IMPLANT
SPONGE LAP 18X18 X RAY DECT (DISPOSABLE) IMPLANT
STAPLER VISISTAT 35W (STAPLE) ×4 IMPLANT
STEM DIST FEM CENTRALIZR 11 (Hips) ×2 IMPLANT
STEM SUMMIT CEMENTED BASIC SZ3 (Hips) ×1 IMPLANT
SUMMIT CEMENT BASIC SZ3 (Hips) ×4 IMPLANT
SUT ETHIBOND 2 V 37 (SUTURE) ×4 IMPLANT
SUT ETHILON 3 0 FSL (SUTURE) ×4 IMPLANT
SUT VIC AB 0 CTB1 27 (SUTURE) ×4 IMPLANT
SUT VIC AB 1 CTX 36 (SUTURE) ×4
SUT VIC AB 1 CTX36XBRD ANBCTR (SUTURE) ×3 IMPLANT
SUT VIC AB 2-0 CTB1 (SUTURE) ×4 IMPLANT
SWAB COLLECTION DEVICE MRSA (MISCELLANEOUS) ×2 IMPLANT
SYR 20ML ECCENTRIC (SYRINGE) ×2 IMPLANT
SYR CONTROL 10ML LL (SYRINGE) ×4 IMPLANT
TOWEL OR 17X24 6PK STRL BLUE (TOWEL DISPOSABLE) ×4 IMPLANT
TOWEL OR 17X26 10 PK STRL BLUE (TOWEL DISPOSABLE) ×4 IMPLANT
TOWER CARTRIDGE SMART MIX (DISPOSABLE) ×2 IMPLANT
TRAY FOLEY CATH 14FR (SET/KITS/TRAYS/PACK) ×2 IMPLANT
TUBE ANAEROBIC SPECIMEN COL (MISCELLANEOUS) ×4 IMPLANT
WATER STERILE IRR 1000ML POUR (IV SOLUTION) ×8 IMPLANT

## 2012-09-20 NOTE — Op Note (Signed)
PATIENT ID:      Dawn Ashley  MRN:     [id number] DOB/AGE:    May 21, 1936 / 77 y.o.       OPERATIVE REPORT    DATE OF PROCEDURE:  09/20/2012       PREOPERATIVE DIAGNOSIS:  RIGHT HIP Superior ACETABULAR FRACTURE, FEMORAL HEAD FRACTURE POSSIBLE                                                       Estimated body mass index is 25.00 kg/(m^2) as calculated from the following:   Height as of 09/15/12: 5\' 3"  (1.6 m).   Weight as of 08/20/12: 64.012 kg (141 lb 1.9 oz).     POSTOPERATIVE DIAGNOSIS:  RIGHT HIP ACETABULAR FRACTURE, FEMORAL HEAD FRACTURE                                                           PROCEDURE:  Open reduction internal fixation right superior acetabular fracture, complex right total hip arthroplasty using a 52 mm DePuy Gryption  Cup, [employer], 10-degree polyethylene liner index superior  and posterior, a +8.5 36 mm metal head, a #3 Summit basic stem, cemented double batch of DePuy 1 cement with 1500 mg of Zinacef, 11 mm centralizer and #3 central canal occluder   SURGEON: Belvin Gauss J    ASSISTANT:   Mauricia Area, PA-C  (present throughout entire procedure and necessary for timely completion of the procedure)   ANESTHESIA:  General  BLOOD LOSS: 500cc FLUID REPLACEMENT: 1800 crystalloid DRAINS: Foley Catheter URINE OUTPUT: 300 COMPLICATIONS:  None    INDICATIONS FOR PROCEDURE: A 77 y.o. year-old female  with a couple of falls at home that resulted in a superior right acetabular fracture dislocation confirmed by CT scan with intact anterior and posterior columns. In order to allow the patient the possibility of ambulation and diminished pain she is taken for open reduction internal fixation of the superior acetabular fracture and reconstruction of the hip with a right total hip hybrid.Patient desires elective right total hip arthroplasty to decrease pain and increase function. The risks, benefits, and alternatives were discussed at length including but not  limited to the risks of infection, bleeding, nerve injury, stiffness, blood clots, the need for revision surgery, cardiopulmonary complications, among others, and they were willing to proceed.y have been discussed. Questions answered.     PROCEDURE IN DETAIL: The patient was identified by armband,  received preoperative IV antibiotics in the holding area at [facility], taken to the operating room , appropriate anesthetic monitors  were attached and general endotracheal anesthesia induced. Foley catheter was inserted. Patient was rolled into the L lateral decubitus position and fixed there with a Stulberg Mark II pelvic clamp and the R lower extremity was then prepped and draped  in the usual sterile fashion from the ankle to the hemipelvis. A time-out  procedure was performed. The skin along the lateral hip and thigh  infiltrated with 10 mL of 0.5% Marcaine and epinephrine solution. We  then made a posterolateral approach to the hip. With a #10 blade, 24 cm  incision through skin and  subcutaneous tissue down to the level of the  IT band. Small bleeders were identified and cauterized. IT band cut in  line with skin incision exposing the greater trochanter. We dissected the interval between the gluteus minimus and gluteus medius exposing the superior acetabular fracture which had greater than 5 fragments. These fragments were removed, and a head was noted to be resting and a superior pseudoacetabulum. A Cobra retractor was placed between the gluteus minimus and the superior hip joint capsule, and a spiked Cobra between the quadratus femoris and the inferior hip joint capsule. This isolated the short  external rotators and piriformis tendons. These were tagged with a #2 Ethibond  suture and cut off their insertion on the intertrochanteric crest. The posterior  capsule was then developed into an acetabular-based flap from Posterior Superior off of the acetabulum out over the femoral neck and back  posterior inferior to the acetabular rim. This flap was tagged with two #2 Ethibond sutures and retracted protecting the sciatic nerve. This exposed the arthritic femoral head and osteophytes, as noted on the CT scan the anterior superior femoral head was eroded. The hip was then flexed and internally rotated, dislocating the femoral head and a standard neck cut performed 1 fingerbreadth above the lesser trochanter.  A spiked Cobra was placed in the cotyloid notch and a Hohmann retractor was then used to lever the femur anteriorly off of the superior anterior pelvic column. A posterior-inferior wing retractor was placed at the junction of the acetabulum and the ischium completing the acetabular exposure.We then removed the peripheral osteophytes and labrum from the acetabulum. After removing callus and soft tissue from the superior pseudoacetabulum we felt it was adequate for reaming and reamed up to a 51 mm basket reamer just getting to the inner wall of the acetabulum. We then trialed with a 50 and 52 acetabular shell confirming good fit and bone tension. The pseudoacetabulum was then irrigated out with normal  saline solution and hammered into place a 52 mm DePuy Gryption cup in 45  degrees of abduction and about 10 degrees of anteversion, limited by the available bone. 2 supplemental 30 mm dome screws were placed in the Gryption cup into the anterior and posterior columns of the ileum. More peripheral osteophytes removed and a trial 10-degree liner placed with the  index superior-posterior. The hip was then flexed and internally rotated exposing the  proximal femur, which was entered with the initiating reamer followed by  the lateral reamer and broaching up to a #3 broach, which had good fit and fill. A trial reduction was then  performed with a +8.5  36-mm ball on the standard neck and  excellent stability was noted with at 90 of flexion with 65 of  internal rotation and then full extension with  external rotation. The hip  could not be dislocated in full extension. The knee could easily flex  to about 140 degrees. We also stretched the abductors at this point,  because of the preexisting adductor contractures. All trial components  were then removed. The acetabulum was irrigated out with normal saline  solution. A titanium Apex [facility] was then screwed into place  followed by a 10-degree polyethylene liner index superior-posterior. On  the femoral side, we then sized for a #3 cement restrictor and irrigated  out the femoral canal with normal saline solution and dried with suction  and sponges. The cement restrictor was placed to the appropriate depth. A  double batch of DePuy 1 cement  with 1500 mg of Zinacef was mixed and  injected into the canal under pressure followed by #3 Summit basic stem  in 20 degrees of anteversion and as this was held in place, excess cement  was removed. At this point, a +0 36-mm metal head was  hammered on the stem. The hip was reduced. We checked our stability  one more time and found to be excellent. The wound was once again  thoroughly irrigated out with normal saline solution pulse lavage. The  capsular flap and short external rotators were repaired back to the  intertrochanteric crest through drill holes with a #2 Ethibond suture.  The IT band was closed with running 1 Vicryl suture. The subcutaneous  tissue with 0 and 2-0 undyed Vicryl suture and the skin with running  interlocking 3-0 nylon suture. Dressing of Xeroform and Mepilex was  then applied. The patient was then unclamped, rolled supine, awaken extubated and taken to recovery room without difficulty in stable condition.   Milanni Ayub J 09/20/2012, 1:07 PM

## 2012-09-20 NOTE — Transfer of Care (Signed)
Immediate Anesthesia Transfer of Care Note  Patient: Dawn Ashley  Procedure(s) Performed: Procedure(s): TOTAL HIP ARTHROPLASTY (Right) OPEN REDUCTION INTERNAL FIXATION (ORIF) PELVIC FRACTURE (Right)  Patient Location: PACU  Anesthesia Type:General  Level of Consciousness: awake, alert , oriented and patient cooperative  Airway & Oxygen Therapy: Patient Spontanous Breathing and Patient connected to nasal cannula oxygen  Post-op Assessment: Report given to PACU RN, Post -op Vital signs reviewed and stable and Patient moving all extremities  Post vital signs: Reviewed and stable  Complications: No apparent anesthesia complications

## 2012-09-20 NOTE — Interval H&P Note (Signed)
History and Physical Interval Note:  09/20/2012 10:39 AM  Dawn Ashley  has presented today for surgery, with the diagnosis of RIGHT HIP ACETABULAR FRACTURE, FEMORAL HEAD FRACTURE POSSIBLE GIRDLESTONE  The various methods of treatment have been discussed with the patient and family. After consideration of risks, benefits and other options for treatment, the patient has consented to  Procedure(s) with comments: TOTAL HIP REVISION (Right) - DEPUY/PINANCLE/CRYPTION,SUMMIT BASIC,CABLES,SYNTHESE 4.5/6.5 SCRESW,ALLOGRAFT CRUTON,PELVIC RECON. PLATE  as a surgical intervention .  The patient's history has been reviewed, patient examined, no change in status, stable for surgery.  I have reviewed the patient's chart and labs.  Questions were answered to the patient's satisfaction.     Nestor Lewandowsky

## 2012-09-20 NOTE — Anesthesia Preprocedure Evaluation (Addendum)
Anesthesia Evaluation  Patient identified by MRN, date of birth, ID band Patient awake    Reviewed: Allergy & Precautions, H&P , NPO status , Patient's Chart, lab work & pertinent test results  Airway Mallampati: I TM Distance: >3 FB Neck ROM: Full    Dental  (+) Teeth Intact and Dental Advisory Given   Pulmonary  breath sounds clear to auscultation        Cardiovascular hypertension, Rhythm:Regular Rate:Normal     Neuro/Psych    GI/Hepatic   Endo/Other    Renal/GU Renal disease     Musculoskeletal   Abdominal   Peds  Hematology   Anesthesia Other Findings   Reproductive/Obstetrics                           Anesthesia Physical Anesthesia Plan  ASA: III  Anesthesia Plan: General   Post-op Pain Management:    Induction: Intravenous  Airway Management Planned: Oral ETT  Additional Equipment: Arterial line  Intra-op Plan:   Post-operative Plan: Extubation in OR  Informed Consent: I have reviewed the patients History and Physical, chart, labs and discussed the procedure including the risks, benefits and alternatives for the proposed anesthesia with the patient or authorized representative who has indicated his/her understanding and acceptance.   Dental advisory given  Plan Discussed with: CRNA, Anesthesiologist and Surgeon  Anesthesia Plan Comments:         Anesthesia Quick Evaluation

## 2012-09-20 NOTE — Progress Notes (Signed)
Pt arrived to PACU with one unit of PRBC"s infusing, completed at this time, no adverse reactions noted at this time, will cont to assess

## 2012-09-20 NOTE — Progress Notes (Signed)
UR COMPLETED  

## 2012-09-20 NOTE — Evaluation (Signed)
Physical Therapy Evaluation Patient Details Name: Dawn Ashley MRN: [id number] DOB: Jan 15, 1936 Today's Date: 09/20/2012 Time: 4098-1191 PT Time Calculation (min): 30 min  PT Assessment / Plan / Recommendation Clinical Impression  Patient is a 77 yo female s/p Rt. THA.  Will benefit from acute PT to maximize independence prior to discharge.  Patient lives alone and has had multiple falls.  Recommend SNF at discharge for continued therapy.    PT Assessment  Patient needs continued PT services    Follow Up Recommendations  SNF    Does the patient have the potential to tolerate intense rehabilitation      Barriers to Discharge Decreased caregiver support      Equipment Recommendations  None recommended by PT    Recommendations for Other Services     Frequency 7X/week    Precautions / Restrictions Precautions Precautions: Posterior Hip;Fall Precaution Booklet Issued: Yes (comment) Precaution Comments: Reviewed hip precautions with patient, her daughter and son-in-law Restrictions Weight Bearing Restrictions: Yes RLE Weight Bearing: Partial weight bearing RLE Partial Weight Bearing Percentage or Pounds: 50% or less Other Position/Activity Restrictions: Abduction pillow   Pertinent Vitals/Pain       Mobility  Bed Mobility Bed Mobility: Supine to Sit;Sitting - Scoot to [place] of Bed;Sit to Supine;Scooting to [facility] Supine to Sit: 2: Max assist;HOB flat Sitting - Scoot to Edge of Bed: 3: Mod assist Sit to Supine: 2: Max assist;HOB flat Scooting to HOB: 3: Mod assist;With rail Details for Bed Mobility Assistance: Verbal cues for technique.  Assist to move RLE off of and onto bed, and to raise trunk off of bed.  Patient sat at EOB x 7 minutes with good balance. Patient able to use rails to scoot up to [facility] with bed flat.  Replaced Abduction pillow. Transfers Transfers: Not assessed    Exercises Total Joint Exercises Ankle Circles/Pumps: AROM;Both;10 reps;Supine   PT  Diagnosis: Difficulty walking;Abnormality of gait;Generalized weakness;Acute pain  PT Problem List: Decreased strength;Decreased range of motion;Decreased activity tolerance;Decreased balance;Decreased mobility;Decreased knowledge of use of DME;Decreased knowledge of precautions;Pain PT Treatment Interventions: DME instruction;Gait training;Functional mobility training;Therapeutic exercise;Patient/family education   PT Goals Acute Rehab PT Goals PT Goal Formulation: With patient/family Time For Goal Achievement: 10/04/12 Potential to Achieve Goals: Good Pt will go Supine/Side to Sit: with supervision;with HOB 0 degrees PT Goal: Supine/Side to Sit - Progress: Goal set today Pt will go Sit to Supine/Side: with supervision;with HOB 0 degrees PT Goal: Sit to Supine/Side - Progress: Goal set today Pt will go Sit to Stand: with min assist;with upper extremity assist PT Goal: Sit to Stand - Progress: Goal set today Pt will go Stand to Sit: with supervision;with upper extremity assist PT Goal: Stand to Sit - Progress: Goal set today Pt will Transfer Bed to Chair/Chair to Bed: with min assist PT Transfer Goal: Bed to Chair/Chair to Bed - Progress: Goal set today Pt will Ambulate: 51 - 150 feet;with min assist;with rolling walker PT Goal: Ambulate - Progress: Goal set today  Visit Information  Last PT Received On: 09/20/12 Assistance Needed: +2    Subjective Data  Subjective: "I'm a little sleepy"  "I was running to my phone and fell"  Daughter reports patient has fallen x3 in last few weeks. Patient Stated Goal: To walk   Prior Functioning  Home Living Lives With: Alone Available Help at Discharge: Skilled Nursing Facility Home Adaptive Equipment: Dan Humphreys - four wheeled;Straight cane;Wheelchair - manual;Shower chair with back;Bedside commode/3-in-1 Prior Function Level of Independence: Independent with  assistive device(s);Needs assistance Needs Assistance: Light Housekeeping Light  Housekeeping: Moderate Able to Take Stairs?: Yes Driving: Yes Vocation: Retired Comments: For 2 weeks pta, patient has 24 hour assist due to multiple falls - per daughter. Communication Communication: Surveyor, mining Overall Cognitive Status: Appears within functional limits for tasks assessed/performed Arousal/Alertness: Lethargic Orientation Level: Appears intact for tasks assessed Behavior During Session: Lethargic Cognition - Other Comments: Patient reports feeling "sleepy"    Extremity/Trunk Assessment Right Upper Extremity Assessment RUE ROM/Strength/Tone: WFL for tasks assessed RUE Sensation: WFL - Light Touch Left Upper Extremity Assessment LUE ROM/Strength/Tone: WFL for tasks assessed LUE Sensation: WFL - Light Touch Right Lower Extremity Assessment RLE ROM/Strength/Tone: Deficits;Unable to fully assess;Due to pain RLE ROM/Strength/Tone Deficits: Able to assist moving RLE off of and onto bed. Left Lower Extremity Assessment LLE ROM/Strength/Tone: WFL for tasks assessed LLE Sensation: WFL - Light Touch   Balance Balance Balance Assessed: Yes Static Sitting Balance Static Sitting - Balance Support: No upper extremity supported;Feet supported Static Sitting - Level of Assistance: 5: Stand by assistance Static Sitting - Comment/# of Minutes: 7  End of Session PT - End of Session Equipment Utilized During Treatment: Oxygen (Abduction pillow) Activity Tolerance: Patient limited by fatigue;Patient limited by pain Patient left: in bed;with call bell/phone within reach;with family/visitor present Nurse Communication: Mobility status  GP     Vena Austria 09/20/2012, 9:13 PM Durenda Hurt. Renaldo Fiddler, [facility] Acute Rehab Services Pager [phone]

## 2012-09-20 NOTE — Anesthesia Postprocedure Evaluation (Signed)
  Anesthesia Post-op Note  Patient: Dawn Ashley  Procedure(s) Performed: Procedure(s) (LRB): TOTAL HIP ARTHROPLASTY (Right) OPEN REDUCTION INTERNAL FIXATION (ORIF) PELVIC FRACTURE (Right)  Patient Location: PACU  Anesthesia Type: General  Level of Consciousness: awake and alert   Airway and Oxygen Therapy: Patient Spontanous Breathing  Post-op Pain: mild  Post-op Assessment: Post-op Vital signs reviewed, Patient's Cardiovascular Status Stable, Respiratory Function Stable, Patent Airway and No signs of Nausea or vomiting  Last Vitals:  Filed Vitals:   09/20/12 1345  BP: 123/59  Pulse: 79  Temp: 35.8 C  Resp: 10    Post-op Vital Signs: stable   Complications: No apparent anesthesia complications. Tranfusing one unit PRBCs at end of case to finish in PACU.

## 2012-09-20 NOTE — Anesthesia Procedure Notes (Signed)
Procedure Name: Intubation Date/Time: 09/20/2012 10:55 AM Performed by: Jerilee Hoh Pre-anesthesia Checklist: Patient identified, Emergency Drugs available, Suction available and Patient being monitored Patient Re-evaluated:Patient Re-evaluated prior to inductionOxygen Delivery Method: Circle system utilized Preoxygenation: Pre-oxygenation with 100% oxygen Intubation Type: IV induction Ventilation: Mask ventilation without difficulty Laryngoscope Size: Mac and 3 Grade View: Grade II Tube type: Oral Tube size: 7.0 mm Number of attempts: 1 Placement Confirmation: positive ETCO2,  ETT inserted through vocal cords under direct vision and breath sounds checked- equal and bilateral Secured at: 22 cm Tube secured with: Tape Dental Injury: Teeth and Oropharynx as per pre-operative assessment

## 2012-09-21 LAB — CBC
MCH: 29.2 pg (ref 26.0–34.0)
Platelets: 203 10*3/uL (ref 150–400)
RBC: 2.91 MIL/uL — ABNORMAL LOW (ref 3.87–5.11)
WBC: 8.2 10*3/uL (ref 4.0–10.5)

## 2012-09-21 LAB — BASIC METABOLIC PANEL
CO2: 23 mEq/L (ref 19–32)
Calcium: 7.6 mg/dL — ABNORMAL LOW (ref 8.4–10.5)
GFR calc non Af Amer: 71 mL/min — ABNORMAL LOW (ref >90)
Sodium: 138 mEq/L (ref 135–145)

## 2012-09-21 NOTE — Progress Notes (Signed)
Seen and agreed 09/21/2012 Fredrich Birks PTA [phone] pager [phone] office

## 2012-09-21 NOTE — Progress Notes (Signed)
Physical Therapy Treatment Patient Details Name: Dawn Ashley MRN: [id number] DOB: 02/24/1936 Today's Date: 09/21/2012 Time: 4098-1191 PT Time Calculation (min): 42 min  PT Assessment / Plan / Recommendation Comments on Treatment Session  Pt s/p R THA presenting with mild tremors and fear of falling. Pt progressing with added exericses and ambulation this session. Will continue to need therapy to increase functional mobility. Pt is very slow with movements stating that she is fearful of falling. Will coninue curent POC and progress as tolerable.    Follow Up Recommendations  SNF     Does the patient have the potential to tolerate intense rehabilitation     Barriers to Discharge        Equipment Recommendations  None recommended by PT    Recommendations for Other Services    Frequency 7X/week   Plan Discharge plan remains appropriate;Frequency remains appropriate    Precautions / Restrictions Precautions Precautions: Posterior Hip;Fall Precaution Comments: Re educated pts on all hip precautions and PWBing status. Restrictions Weight Bearing Restrictions: Yes RLE Weight Bearing: Partial weight bearing RLE Partial Weight Bearing Percentage or Pounds: 50 %   Pertinent Vitals/Pain no apparent distress     Mobility  Transfers Transfers: Sit to Stand;Stand to Sit Sit to Stand: 3: Mod assist;With upper extremity assist;From chair/3-in-1 Stand to Sit: 3: Mod assist;With upper extremity assist;To chair/3-in-1 Details for Transfer Assistance: (A) to stand and shift COM over BOS. (A) to obtain balance once in standing position as pt is unsteady. Cueing for hand placement, technique and RW positioning. Hand over hand guidance for pushing up and reaching back. Ambulation/Gait Ambulation/Gait Assistance: 3: Mod assist Ambulation Distance (Feet): 18 Feet Assistive device: Rolling walker Ambulation/Gait Assistance Details: PT with mild tremors. Initailly unsteady with fist few steps  but able to maintain balance with distance. Cueing for posture and PWBing  Gait Pattern: Step-to pattern;Decreased stride length;Trunk flexed Gait velocity: decreased    Exercises Total Joint Exercises Ankle Circles/Pumps: AROM;Both;10 reps Quad Sets: AROM;Right;10 reps Gluteal Sets: AROM;Both;10 reps Heel Slides: AAROM;Right;10 reps   PT Diagnosis:    PT Problem List:   PT Treatment Interventions:     PT Goals Acute Rehab PT Goals PT Goal: Sit to Stand - Progress: Progressing toward goal PT Goal: Stand to Sit - Progress: Progressing toward goal PT Goal: Ambulate - Progress: Progressing toward goal  Visit Information  Last PT Received On: 09/21/12    Subjective Data      Cognition  Cognition Overall Cognitive Status: Appears within functional limits for tasks assessed/performed Arousal/Alertness: Awake/alert Orientation Level: Appears intact for tasks assessed Behavior During Session: [facility] for tasks performed    Balance     End of Session PT - End of Session Equipment Utilized During Treatment: Gait belt Activity Tolerance: Patient tolerated treatment well Patient left: in chair;with call bell/phone within reach   GP     Lazaro Arms 09/21/2012, 11:48 AM

## 2012-09-21 NOTE — Clinical Social Work Placement (Addendum)
Clinical Social Work Department CLINICAL SOCIAL WORK PLACEMENT NOTE 09/21/2012  Patient:  Dawn Ashley, Dawn Ashley  Account Number:  [id number] Admit date:  09/20/2012  Clinical Social Worker:  Johnsie Cancel  Date/time:  09/21/2012 02:23 PM  Clinical Social Work is seeking post-discharge placement for this patient at the following level of care:   SKILLED NURSING   (*CSW will update this form in Epic as items are completed)   09/21/2012  Patient/family provided with [facility] Department of Clinical Social Work's list of facilities offering this level of care within the geographic area requested by the patient (or if unable, by the patient's family).  09/21/2012  Patient/family informed of their freedom to choose among providers that offer the needed level of care, that participate in Medicare, Medicaid or managed care program needed by the patient, have an available bed and are willing to accept the patient.  09/21/2012  Patient/family informed of [facility]' ownership interest in [facility], as well as of the fact that they are under no obligation to receive care at this facility.  PASARR submitted to EDS on 09/21/2012 PASARR number received from EDS on 09/21/2012  FL2 transmitted to all facilities in geographic area requested by pt/family on  09/21/2012 FL2 transmitted to all facilities within larger geographic area on N/A  Patient informed that his/her managed care company has contracts with or will negotiate with  certain facilities, including the following:     Patient/family informed of bed offers received:  09/22/2012 Patient chooses bed at [facility] Physician recommends and patient chooses bed at  N/A  Patient to be transferred to [facility] on 09/23/2012 Patient to be transferred to facility by [facility]  The following physician request were entered in Epic:   Additional Comments:  Lia Foyer, LCSWA [facility] Clinical Social  Worker Contact #: [phone]

## 2012-09-21 NOTE — Clinical Social Work Psychosocial (Signed)
Clinical Social Work Department BRIEF PSYCHOSOCIAL ASSESSMENT 09/21/2012  Patient:  YARESLY, MENZEL     Account Number:  [id number]     Admit date:  09/20/2012  Clinical Social Worker:  Johnsie Cancel  Date/Time:  09/21/2012 02:26 PM  Referred by:  Physician  Date Referred:  09/21/2012 Referred for  SNF Placement   Other Referral:   Interview type:  Patient Other interview type:    PSYCHOSOCIAL DATA Living Status:  ALONE   Primary support name:  Ramon Dredge [phone]) Primary support relationship to patient:  CHILD, ADULT Degree of support available:   Adequate.    CURRENT CONCERNS Current Concerns  Post-Acute Placement    SOCIAL WORK ASSESSMENT / PLAN CSW consulted re: SNF placement. CSW met with patient at bedside to discuss d/c options. CSW provided support and education on SNF process. Patient stated she wanted [facility] if there is a bed available. Patient was agreeable to [facility] search as a back up. CSW will work on the patient's discharge.   Assessment/plan status:  Information/Referral to [facility] Other assessment/ plan:   Information/referral to community resources: SNF List   PATIENT'S/FAMILY'S RESPONSE TO PLAN OF CARE: Patient thanked CSW for assisting in d/c planning and providing support.    Lia Foyer, LCSWA [facility] Clinical Social Worker Contact #: [phone]

## 2012-09-21 NOTE — Progress Notes (Signed)
Patient ID: Dawn Ashley, female   DOB: 1935/12/02, 77 y.o.   MRN: [id number] PATIENT ID: Dawn Ashley  MRN: [id number]  DOB/AGE:  27-Feb-1936 / 77 y.o.  1 Day Post-Op Procedure(s) (LRB): TOTAL HIP ARTHROPLASTY (Right) OPEN REDUCTION INTERNAL FIXATION (ORIF) PELVIC FRACTURE (Right)    PROGRESS NOTE Subjective: Patient is alert, oriented,no Nausea, no Vomiting, yes passing gas, no Bowel Movement. Taking PO well. Denies SOB, Chest or Calf Pain. Using Incentive Spirometer, PAS in place. Ambulate 50% weight bearing, pt should not ambulate without direct supervision. Patient reports pain as 3 on 0-10 scale  .    Objective: Vital signs in last 24 hours: Filed Vitals:   09/20/12 2100 09/21/12 0000 09/21/12 0400 09/21/12 0639  BP: 107/56   110/70  Pulse: 66   70  Temp: 98 F (36.7 C)   98.8 F (37.1 C)  TempSrc: Oral   Oral  Resp: 16 16 16 16   SpO2: 98%   97%      Intake/Output from previous day: I/O last 3 completed shifts: In: 2400 [I.V.:1800; Blood:350; IV Piggyback:250] Out: 700 [Urine:200; Blood:500]   Intake/Output this shift: Total I/O In: 1212.5 [I.V.:1112.5; IV Piggyback:100] Out: 650 [Urine:650]   LABORATORY DATA:  Recent Labs  09/20/12 1433 09/21/12 0545  WBC  --  8.2  HGB 9.2* 8.5*  HCT 28.0* 25.9*  PLT  --  203    Examination: Neurologically intact ABD soft Neurovascular intact Sensation intact distally Intact pulses distally Dorsiflexion/Plantar flexion intact Incision: scant drainage No cellulitis present Compartment soft} XR AP&Lat of hip shows well placed\fixed THA. Dome screws, gryption cup high in pseudo acetabulum  Assessment:   1 Day Post-Op Procedure(s) (LRB): TOTAL HIP ARTHROPLASTY (Right) OPEN REDUCTION INTERNAL FIXATION (ORIF) PELVIC FRACTURE (Right), moderate bone loss ADDITIONAL DIAGNOSIS:  Parkinsons disease, multiple falls (caused pelvic fx)   Plan: PT/OT WBAT, THA  posterior precautions, check labs today  DVT Prophylaxis:  SCDx72 hrs, ASA 325 mg BID x 2 weeks  DISCHARGE PLAN: Skilled Nursing Facility/Rehab, must prevent further falls  DISCHARGE NEEDS: HHPT, HHRN, CPM, Walker and 3-in-1 comode seat

## 2012-09-22 ENCOUNTER — Inpatient Hospital Stay (HOSPITAL_COMMUNITY): Payer: Medicare Other

## 2012-09-22 LAB — CBC
MCV: 89.1 fL (ref 78.0–100.0)
Platelets: 203 10*3/uL (ref 150–400)
RBC: 2.85 MIL/uL — ABNORMAL LOW (ref 3.87–5.11)
WBC: 14.1 10*3/uL — ABNORMAL HIGH (ref 4.0–10.5)

## 2012-09-22 NOTE — Progress Notes (Signed)
Seen and agreed 09/22/2012 Robinette, Julia Elizabeth PTA [phone] pager [phone] office    

## 2012-09-22 NOTE — Progress Notes (Signed)
CARE MANAGEMENT NOTE 09/22/2012  Patient:  Dawn Ashley, Dawn Ashley   Account Number:  [id number]  Date Initiated:  09/22/2012  Documentation initiated by:  Vance Peper  Subjective/Objective Assessment:   77 yr old female s/p right hip ORIF     Action/Plan:   Patient is for shortterm rehab at [facility]. Social Worker is aware.   Anticipated DC Date:  09/24/2012   Anticipated DC Plan:  SKILLED NURSING FACILITY  In-house referral  Clinical Social Worker      DC Planning Services  CM consult      Choice offered to / List presented to:          [facility] arranged  NA      Status of service:  Completed, signed off Medicare Important Message given?   (If response is "NO", the following Medicare IM given date fields will be blank) Date Medicare IM given:   Date Additional Medicare IM given:    Discharge Disposition:  SKILLED NURSING FACILITY  Per UR Regulation:    If discussed at Long Length of Stay Meetings, dates discussed:    Comments:

## 2012-09-22 NOTE — Progress Notes (Signed)
Physical Therapy Treatment Patient Details Name: Dawn Ashley MRN: [id number] DOB: 02/20/1936 Today's Date: 09/22/2012 Time: 1478-2956 PT Time Calculation (min): 44 min  PT Assessment / Plan / Recommendation Comments on Treatment Session  Pt s/p R THA presenting with mild tremors. Increased confidence this session with standing and  taking steps. Continues to require Mod A for balance and stability throughout mobility.    Follow Up Recommendations  SNF           Equipment Recommendations  None recommended by PT    Recommendations for Other Services    Frequency 7X/week   Plan Discharge plan remains appropriate;Frequency remains appropriate    Precautions / Restrictions Precautions Precautions: Posterior Hip;Fall Precaution Comments: Pt able to recall WBing but unable to figure out how much is 50 %. Able to recall 2/3 posterior hip precautions. Re educated pt on all precautions and cues for guaging 50 %. Restrictions RLE Weight Bearing: Partial weight bearing Other Position/Activity Restrictions: Abduction pillow   Pertinent Vitals/Pain no apparent distress     Mobility  Bed Mobility Supine to Sit: 3: Mod assist (slightly elevated (11 degrees)) Sitting - Scoot to Edge of Bed: 3: Mod assist Details for Bed Mobility Assistance:  (A) to move RLE off bed and to reaise trunck off bed. Cueing for hand placement and technique. Transfers Sit to Stand: 3: Mod assist;With upper extremity assist;From bed Stand to Sit: 3: Mod assist;With upper extremity assist;To chair/3-in-1;To bed Details for Transfer Assistance: (A) to stand and shift COM over BOS. (A) to obtain balance once in standing position as pt is unsteady. Cueing for hand placement, technique and RW positioning. Sit <> stand x3 with less assistance on third trial. Ambulation/Gait Ambulation Distance (Feet): 30 Feet Assistive device: Rolling walker Ambulation/Gait Assistance Details: cueing to increase step length, posture  and PWB. continues to need assistance for balance and stability Gait Pattern: Step-to pattern;Decreased stride length;Trunk flexed      PT Goals Acute Rehab PT Goals PT Goal: Supine/Side to Sit - Progress: Progressing toward goal PT Goal: Sit to Stand - Progress: Progressing toward goal PT Goal: Stand to Sit - Progress: Progressing toward goal PT Transfer Goal: Bed to Chair/Chair to Bed - Progress: Progressing toward goal PT Goal: Ambulate - Progress: Progressing toward goal  Visit Information  Last PT Received On: 09/22/12 Assistance Needed: +1    Subjective Data  Subjective: " I feel good. That was was good. I can trust you now. Im not as afraid."   Cognition  Cognition Overall Cognitive Status: Appears within functional limits for tasks assessed/performed Arousal/Alertness: Awake/alert Orientation Level: Appears intact for tasks assessed Behavior During Session: [facility] for tasks performed       End of Session PT - End of Session Equipment Utilized During Treatment: Gait belt Activity Tolerance: Patient tolerated treatment well Patient left: in chair;with call bell/phone within reach;with family/visitor present   GP     Lazaro Arms 09/22/2012, 9:47 AM

## 2012-09-22 NOTE — Progress Notes (Signed)
Patient is confused this aftternoon. She keeps talking to her self about her family, and asking her son Elijah Birk questions. Elijah Birk is not in the room at this time. She is also taking about her other son Ed, who is also not in the room. She answers questions clearly.  She could tell me she was at a [facility] hospital. She could tell me it was night time, and the time is 1742. She told me the year was 2014.  When asked why she came in the hospital, she told me she broke her hip, and had surgery on Monday, and that today is Wednesday.   But then while sitting with the patient she becomes confused.  It seems that when she is sleeping, she becomes anxious and confused and begins to pull at her clothing, and lines that are attached to her.   Son comes to room at 1800 and sees that his mother is confused as well.   Please evaluate patient. Thank you, Dawn Ashley

## 2012-09-22 NOTE — Progress Notes (Signed)
Patient ID: Dawn Ashley, female   DOB: 24-Jan-1936, 77 y.o.   MRN: [id number] PATIENT ID: Dawn Ashley  MRN: [id number]  DOB/AGE:  September 28, 1935 / 77 y.o.  2 Days Post-Op Procedure(s) (LRB): TOTAL HIP ARTHROPLASTY (Right) OPEN REDUCTION INTERNAL FIXATION (ORIF) PELVIC FRACTURE (Right)    PROGRESS NOTE Subjective: Patient is alert, oriented,no Nausea, no Vomiting, yes passing gas, no Bowel Movement. Taking PO well. Denies SOB, Chest or Calf Pain. Using Incentive Spirometer, PAS in place. Ambulate a few steps with physical therapy, patient had mild tremors and an unsteady gait. I have received a request from Dr. Rosanne Ashing love for a possible brain scan as part of her Parkinson's workup and will comply [place] and as I get more information. Patient reports pain as 3 on 0-10 scale  .    Objective: Vital signs in last 24 hours: Filed Vitals:   09/21/12 0639 09/21/12 1122 09/21/12 2135 09/22/12 0611  BP: 110/70  109/43 119/52  Pulse: 70  107 109  Temp: 98.8 F (37.1 C)  98.9 F (37.2 C) 98 F (36.7 C)  TempSrc: Oral  Oral Oral  Resp: 16 18 18 18   SpO2: 97% 94% 96% 97%      Intake/Output from previous day: I/O last 3 completed shifts: In: 1212.5 [I.V.:1112.5; IV Piggyback:100] Out: 750 [Urine:750]   Intake/Output this shift:     LABORATORY DATA:  Recent Labs  09/21/12 0545 09/22/12 0530  WBC 8.2 14.1*  HGB 8.5* 8.5*  HCT 25.9* 25.4*  PLT 203 203  NA 138  --   K 4.5  --   CL 108  --   CO2 23  --   BUN 23  --   CREATININE 0.80  --   GLUCOSE 113*  --   CALCIUM 7.6*  --     Examination: Neurologically intact ABD soft Neurovascular intact Sensation intact distally Intact pulses distally Dorsiflexion/Plantar flexion intact Incision: no drainage No cellulitis present Compartment soft} XR AP&Lat of hip shows well placed\fixed THA  Assessment:   2 Days Post-Op Procedure(s) (LRB): TOTAL HIP ARTHROPLASTY (Right) OPEN REDUCTION INTERNAL FIXATION (ORIF) PELVIC FRACTURE  (Right) ADDITIONAL DIAGNOSIS:  Acute Blood Loss Anemia and Parkinson's disease, sciatica.  Plan: PT/OT WBAT, THA  posterior precautions, will order a brain scan as part of her Parkinson's workup once I discuss this with Dr. Rosanne Ashing love of neurology.  DVT Prophylaxis: SCDx72 hrs, ASA 325 mg BID x 2 weeks,  DISCHARGE PLAN: Skilled Nursing Facility/Rehab, once patient has passed physical therapy goals. Patient will be partial weightbearing at 50% for the next 6 weeks. She'll need close supervision to avoid any falls onto the right side. If she sustains a fall to the right side there is a significant chance that she will damage the hip replacement, and with her bone loss it was found at surgery further reconstruction may not be possible.  DISCHARGE NEEDS: HHPT, HHRN, CPM, Walker and 3-in-1 comode seat

## 2012-09-22 NOTE — Evaluation (Signed)
Occupational Therapy Evaluation Patient Details Name: Dawn Ashley MRN: [id number] DOB: 1936-03-29 Today's Date: 09/22/2012 Time: 4098-1191 OT Time Calculation (min): 34 min  OT Assessment / Plan / Recommendation Clinical Impression  Pt is 77y/o female s/p RTHA and ORIF pelvic fracture. She has posterior hip precautions, h/o falls and is PWB RLE. In standing w/ RW, pt has strong posterior lean and difficulty maintaining PWB status. Pt should benefit from acute OT followed by SNF rehab to assist in maximizing I related to current ADL deficits    OT Assessment  Patient needs continued OT Services    Follow Up Recommendations  SNF    Barriers to Discharge      Equipment Recommendations  None recommended by OT    Recommendations for Other Services    Frequency  Min 3X/week    Precautions / Restrictions Precautions Precautions: Posterior Hip;Fall Precaution Comments: Pt able to recall WBing but unable to figure out how much is 50 %. Able to recall 2/3 posterior hip precautions. Re educated pt on all precautions. Restrictions RLE Weight Bearing: Partial weight bearing Other Position/Activity Restrictions: Abduction pillow   Pertinent Vitals/Pain 7/10 prior to activity and after activity. Repositioned in bed w/ ABD pillow, pt reports that she had pain medication prior to OT arrival.   ADL  Eating/Feeding: Simulated;Set up Where Assessed - Eating/Feeding: Chair Grooming: Performed;Wash/dry hands;Set up Where Assessed - Grooming: Supported sitting Upper Body Bathing: Simulated;Minimal assistance Where Assessed - Upper Body Bathing: Supported sitting Lower Body Bathing: Simulated;+1 Total assistance Where Assessed - Lower Body Bathing: Supported sit to stand;Other (comment) (Pt w/ posterior lean and difficulty maintaining PWB R LE) Upper Body Dressing: Simulated;Minimal assistance Where Assessed - Upper Body Dressing: Supported sitting Lower Body Dressing: Performed;+1 Total  assistance Where Assessed - Lower Body Dressing: Supported sit to stand;Supported sitting Toilet Transfer: Performed;Maximal Dentist Method: Sit to Barista: Bedside commode;Other (comment) (RW, strong posterior lean) Toileting - Clothing Manipulation and Hygiene: Performed;Maximal assistance Where Assessed - Toileting Clothing Manipulation and Hygiene: Sit to stand from 3-in-1 or toilet Tub/Shower Transfer Method: Not assessed Equipment Used: Rolling walker;Gait belt;Other (comment) (3:1) Transfers/Ambulation Related to ADLs: Pt with strong posterior lean in standing and also w/ fear of falls. This combined w/ tremors and pt w/ difficulty adhering to [facility] RLE make functional mobility difficult for pt. ADL Comments: Pt is 77y/o female s/p RTHA and ORIF pelvic fracture. She has posterior hip precautions, h/o falls and is PWB RLE. In standing w/ RW, pt has strong posterior lean and difficulty maintaining PWB status. Pt should benefit from acute OT followed by SNF rehab to assist in maximizing I related to current ADL deficits.    OT Diagnosis: Generalized weakness;Acute pain  OT Problem List: Decreased strength;Decreased activity tolerance;Impaired balance (sitting and/or standing);Decreased knowledge of precautions;Decreased knowledge of use of DME or AE;Pain;Other (comment) (Fera of falls and h/o falls) OT Treatment Interventions: Self-care/ADL training;DME and/or AE instruction;Balance training;Patient/family education;Therapeutic activities   OT Goals Acute Rehab OT Goals OT Goal Formulation: With patient Time For Goal Achievement: 10/20/12 Potential to Achieve Goals: Good ADL Goals Pt Will Perform Grooming: with modified independence;Sitting, edge of bed;Sitting at sink;Supported;Unsupported ADL Goal: Grooming - Progress: Goal set today Pt Will Perform Upper Body Dressing: with modified independence;with set-up;Sitting, bed;Sitting, chair ADL Goal:  Upper Body Dressing - Progress: Goal set today Pt Will Perform Lower Body Dressing: with min assist;Sit to stand from chair;Sit to stand from bed;with adaptive equipment;with cueing (comment type and amount) (  Adhering to posterior hip precautions and PWB RLE) ADL Goal: Lower Body Dressing - Progress: Goal set today Pt Will Transfer to Toilet: with min assist;Ambulation;with DME;3-in-1;Maintaining weight bearing status;Maintaining hip precautions;Raised toilet seat with arms ADL Goal: Toilet Transfer - Progress: Goal set today Pt Will Perform Toileting - Clothing Manipulation: with min assist;Sitting on 3-in-1 or toilet;Standing ADL Goal: Toileting - Clothing Manipulation - Progress: Goal set today Pt Will Perform Toileting - Hygiene: with set-up;Sitting on 3-in-1 or toilet ADL Goal: Toileting - Hygiene - Progress: Goal set today  Visit Information  Last OT Received On: 09/22/12 Assistance Needed: +1    Subjective Data  Subjective: Pt reports "I'm going to the bathroom, then I want to go back to bed" Patient Stated Goal: SNF/rehab   Prior Functioning     Home Living Lives With: Alone Available Help at Discharge: Skilled Nursing Facility Home Adaptive Equipment: Dan Humphreys - four wheeled;Straight cane;Wheelchair - manual;Shower chair with back;Bedside commode/3-in-1 Prior Function Level of Independence: Independent with assistive device(s);Needs assistance Needs Assistance: Light Housekeeping Light Housekeeping: Moderate Able to Take Stairs?: Yes Driving: Yes Vocation: Retired Comments: For 2 weeks PTA in hospital, pt reportedly had 24hr assist secondary to multiple falls Communication Communication: HOH Dominant Hand: Left    Vision/Perception Vision - History Baseline Vision: Wears glasses all the time Patient Visual Report: No change from baseline   Cognition  Cognition Overall Cognitive Status: Appears within functional limits for tasks assessed/performed Arousal/Alertness:  Awake/alert Orientation Level: Appears intact for tasks assessed Behavior During Session: [facility] for tasks performed    Extremity/Trunk Assessment Right Upper Extremity Assessment RUE ROM/Strength/Tone: WFL for tasks assessed RUE Sensation: WFL - Light Touch Left Upper Extremity Assessment LUE ROM/Strength/Tone: WFL for tasks assessed LUE Sensation: WFL - Light Touch Right Lower Extremity Assessment RLE ROM/Strength/Tone Deficits: Able to assist moving RLE off of and onto bed.     Mobility Bed Mobility Bed Mobility: Sit to Supine Supine to Sit:  (slightly elevated (11 degrees)) Sitting - Scoot to Edge of Bed: 3: Mod assist Sit to Supine: HOB flat;2: Max assist;1: +1 Total assist Scooting to HOB: 2: Max assist;With rail Details for Bed Mobility Assistance:  (A) to move RLE off bed and to reaise trunck off bed. Cueing for hand placement and technique. Transfers Transfers: Sit to Stand;Stand to Sit Sit to Stand: 3: Mod assist;With upper extremity assist;From bed;2: Max assist;From chair/3-in-1 Stand to Sit: 3: Mod assist;With upper extremity assist;To chair/3-in-1;To bed Details for Transfer Assistance: Assist to obtain balance once in standing position as pt is unsteady and has strong posterior lean. Cueing for hand placement, technique and RW positioning. Pt also has fear of falls        Balance Balance Balance Assessed: Yes Static Sitting Balance Static Sitting - Balance Support: Feet supported;Left upper extremity supported Static Sitting - Level of Assistance: 5: Stand by assistance   End of Session OT - End of Session Equipment Utilized During Treatment: Gait belt;Other (comment) (RW, 3:1, ABD pillow) Activity Tolerance: Patient tolerated treatment well Patient left: in bed;with call bell/phone within reach  GO     Alm Bustard 09/22/2012, 1:15 PM

## 2012-09-23 ENCOUNTER — Encounter (HOSPITAL_COMMUNITY): Payer: Self-pay | Admitting: Orthopedic Surgery

## 2012-09-23 LAB — BODY FLUID CULTURE

## 2012-09-23 LAB — CBC
HCT: 24 % — ABNORMAL LOW (ref 36.0–46.0)
Hemoglobin: 7.9 g/dL — ABNORMAL LOW (ref 12.0–15.0)
WBC: 11.3 10*3/uL — ABNORMAL HIGH (ref 4.0–10.5)

## 2012-09-23 LAB — POCT I-STAT 4, (NA,K, GLUC, HGB,HCT)
Glucose, Bld: 94 mg/dL (ref 70–99)
HCT: 23 % — ABNORMAL LOW (ref 36.0–46.0)
Hemoglobin: 7.8 g/dL — ABNORMAL LOW (ref 12.0–15.0)
Potassium: 3.9 mEq/L (ref 3.5–5.1)

## 2012-09-23 MED ORDER — ASPIRIN 325 MG PO TBEC: 325.0000 mg | DELAYED_RELEASE_TABLET | Freq: Two times a day (BID) | ORAL | Status: DC

## 2012-09-23 MED ORDER — OXYCODONE HCL 5 MG PO TABS: 5.0000 mg | ORAL_TABLET | ORAL | Status: DC | PRN

## 2012-09-23 NOTE — Progress Notes (Addendum)
Patient ID: Dawn Ashley, female   DOB: 1936-04-27, 77 y.o.   MRN: [id number] PATIENT ID: Dawn Ashley  MRN: [id number]  DOB/AGE:  [birth year] / 77 y.o.  3 Days Post-Op Procedure(s) (LRB): TOTAL HIP ARTHROPLASTY (Right) OPEN REDUCTION INTERNAL FIXATION (ORIF) PELVIC FRACTURE (Right)    PROGRESS NOTE Subjective: Patient is alert, oriented,no Nausea, no Vomiting, yes passing gas, no Bowel Movement. Taking PO well. Denies SOB, Chest or Calf Pain. Using Incentive Spirometer, PAS in place. Ambulate partial weightbearing and room with physical therapy assistance 50% weightbearing. Patient was seen in the room with her son today, and both he and his brother are still awaiting to hear from social worker regarding possible placement at [facility] which is relatively near their home. Patient reports pain as 3 on 0-10 scale  .    Objective: Vital signs in last 24 hours: Filed Vitals:   09/22/12 0800 09/22/12 1228 09/22/12 1553 09/22/12 2051  BP:  106/49  131/44  Pulse:  104  116  Temp:  98 F (36.7 C)  98.2 F (36.8 C)  TempSrc:    Oral  Resp: 17 18 16 18   SpO2: 93% 100% 93% 98%      Intake/Output from previous day:     Intake/Output this shift:     LABORATORY DATA:  Recent Labs  09/21/12 0545 09/22/12 0530 09/23/12 0525  WBC 8.2 14.1* 11.3*  HGB 8.5* 8.5* 7.9*  HCT 25.9* 25.4* 24.0*  PLT 203 203 201  NA 138  --   --   K 4.5  --   --   CL 108  --   --   CO2 23  --   --   BUN 23  --   --   CREATININE 0.80  --   --   GLUCOSE 113*  --   --   CALCIUM 7.6*  --   --     Examination: Neurologically intact ABD soft Neurovascular intact Sensation intact distally Intact pulses distally Dorsiflexion/Plantar flexion intact Incision: no drainage No cellulitis present Compartment soft} XR AP&Lat of hip shows well placed\fixed THA  Assessment:   3 Days Post-Op Procedure(s) (LRB): TOTAL HIP ARTHROPLASTY (Right) OPEN REDUCTION INTERNAL FIXATION (ORIF) PELVIC FRACTURE  (Right) ADDITIONAL DIAGNOSIS:  Parkinson's disease, microvascular cerebral disease with some progression on recent MRI scan of brain.  Plan: PT/OT WBAT, THA  posterior precautions, FL2 signed by me today family is interested in [place], and based on the 09/22/2012 note from the social worker that looks like it is in progress dear  DVT Prophylaxis: SCDx72 hrs, ASA 325 mg BID x 2 weeks  DISCHARGE PLAN: Skilled Nursing Facility/Rehab  DISCHARGE NEEDS: HHPT, HHRN, CPM, Walker and 3-in-1 comode seat

## 2012-09-23 NOTE — Progress Notes (Signed)
Nutrition Brief Note  RD drawn to chart due to potential food-medication interaction.  Pt is on MAOI.  Estimated body mass index is 25.00 kg/(m^2) as calculated from the following:  Height as of 09/15/12: 5\' 3"  (1.6 m).  Weight as of 08/20/12: 64.012 kg (141 lb 1.9 oz).  Current diet order is Low Sodium, patient is reportedly eating and tolerating diet. Labs and medications reviewed.   Pt recently resumed her home medication of Azilect for advanced Parkinson's.  She is intermittently confused.  Pt is planning to d/c to SNF today.  RD discussed with RN who reports pt will be discharging within the hour.  No nutrition-related concerns reported by pt. Facility to manage home diet.  No nutrition interventions warranted at this time. If nutrition issues arise, please consult RD.   Loyce Dys, MS RD LDN Clinical Inpatient Dietitian Pager: [phone] Weekend/After hours pager: [phone]

## 2012-09-23 NOTE — Discharge Summary (Signed)
Patient ID: LAWRENCIA MAUNEY MRN: [id number] DOB/AGE: 1935-11-14 77 y.o.  Admit date: 09/20/2012 Discharge date: 09/23/2012  Admission Diagnoses:  Principal Problem:   Closed right acetabular fracture   Discharge Diagnoses:  Same  Past Medical History  Diagnosis Date  . Mitral valve prolapse   . Menopause   . Osteopenia     sacral insuff fx on MRI L spine 01/2011  . Parkinson disease 01/2005 dx  . Back pain   . Displacement of lumbar intervertebral disc without myelopathy   . Kidney stones   . Osteoporosis with fracture   . Hypertension   . Heart murmur     h/o MVP, pt. reports murmur is almost resolved  . Arthritis     osteoporosis, osteoarthritis   . Neuromuscular disorder     parkinson's disease, followed by Dr. Sandria Manly    Surgeries: Procedure(s): TOTAL HIP ARTHROPLASTY OPEN REDUCTION INTERNAL FIXATION (ORIF) PELVIC FRACTURE on 09/20/2012   Consultants:    Discharged Condition: Improved  Hospital Course: SHAWNTAE LOWY is an 77 y.o. female who was admitted 09/20/2012 for operative treatment ofClosed right acetabular fracture. Patient has severe unremitting pain that affects sleep, daily activities, and work/hobbies. After pre-op clearance the patient was taken to the operating room on 09/20/2012 and underwent  Procedure(s): RTOTAL HIP ARTHROPLASTY OPEN REDUCTION INTERNAL FIXATION (ORIF) PELVIC FRACTURE.    Patient was given perioperative antibiotics: Anti-infectives   Start     Dose/Rate Route Frequency Ordered Stop   09/20/12 1900  ceFAZolin (ANCEF) IVPB 2 g/50 mL premix     2 g 100 mL/hr over 30 Minutes Intravenous  Once 09/20/12 1521 09/20/12 1925   09/20/12 1229  cefUROXime (ZINACEF) injection  Status:  Discontinued       As needed 09/20/12 1230 09/20/12 1338   09/20/12 0600  ceFAZolin (ANCEF) IVPB 2 g/50 mL premix     2 g 100 mL/hr over 30 Minutes Intravenous On call to O.R. 09/19/12 1322 09/20/12 1058       Patient was given sequential compression devices,  early ambulation, and chemoprophylaxis to prevent DVT.  Patient benefited maximally from hospital stay and there were no complications. Because of the damage to her acetabulum she will be 50% weightbearing for the next 6 weeks, posterior total hip precautions.   Recent vital signs: Patient Vitals for the past 24 hrs:  BP Temp Temp src Pulse Resp SpO2  09/23/12 0720 115/48 mmHg 98 F (36.7 C) Axillary 91 17 99 %  09/22/12 2051 131/44 mmHg 98.2 F (36.8 C) Oral 116 18 98 %  09/22/12 1553 - - - - 16 93 %  09/22/12 1228 106/49 mmHg 98 F (36.7 C) - 104 18 100 %  09/22/12 0800 - - - - 17 93 %     Recent laboratory studies:  Recent Labs  09/21/12 0545 09/22/12 0530 09/23/12 0525  WBC 8.2 14.1* 11.3*  HGB 8.5* 8.5* 7.9*  HCT 25.9* 25.4* 24.0*  PLT 203 203 201  NA 138  --   --   K 4.5  --   --   CL 108  --   --   CO2 23  --   --   BUN 23  --   --   CREATININE 0.80  --   --   GLUCOSE 113*  --   --   CALCIUM 7.6*  --   --      Discharge Medications:     Medication List    TAKE these medications  alendronate 70 MG tablet  Commonly known as:  FOSAMAX  Take 70 mg by mouth every Saturday at 6 PM. Take with a full glass of water on an empty stomach.     aspirin 81 MG EC tablet  Take 81 mg by mouth daily.     aspirin 325 MG EC tablet  Take 1 tablet (325 mg total) by mouth 2 (two) times daily.     AZILECT 1 MG Tabs  Generic drug:  rasagiline  Take 1 mg by mouth daily.     CALCIUM + D PO  Take 1 tablet by mouth 2 (two) times daily.     conjugated estrogens vaginal cream  Commonly known as:  PREMARIN  Place 1 g vaginally 2 (two) times a week. Every Monday and Thursday.     diclofenac sodium 1 % Gel  Commonly known as:  VOLTAREN  Apply 4 g topically 3 (three) times daily as needed (pain/inflammation).     gabapentin 300 MG capsule  Commonly known as:  NEURONTIN  Take 300 mg by mouth 3 (three) times daily.     hydrochlorothiazide 25 MG tablet  Commonly known  as:  HYDRODIURIL  Take 25 mg by mouth daily with breakfast.     oxyCODONE 5 MG immediate release tablet  Commonly known as:  Oxy IR/ROXICODONE  Take 1-2 tablets (5-10 mg total) by mouth every 3 (three) hours as needed.     pramipexole 0.5 MG tablet  Commonly known as:  MIRAPEX  Take 0.5 mg by mouth 3 (three) times daily.     Vitamin D 2000 UNITS tablet  Take 4,000 Units by mouth daily.     ZIPSOR 25 MG Caps  Generic drug:  Diclofenac Potassium  Take 1 capsule by mouth 3 (three) times daily.        Diagnostic Studies: Dg Hip Operative Right  10/06/2012  *RADIOLOGY REPORT*  Clinical Data: Hip replacement.  DG OPERATIVE RIGHT HIP  Comparison: CT scan 09/14/2012.  Findings: We are provided with three fluoroscopic spot views of the right hip.  Images demonstrate an acetabular cup in place.  A reamer is in the femur.  Surgical wound is noted.  IMPRESSION: Right hip replacement in progress.   Original Report Authenticated By: Holley Dexter, M.D.    Ct Pelvis Wo Contrast  09/14/2012  *RADIOLOGY REPORT*  Clinical Data: Right hip pain status post fall 1 month ago. Acetabular and femoral head fractures on MRI.  Planned total hip arthroplasty.  CT PELVIS WITHOUT CONTRAST  Technique:  Multidetector CT imaging of the pelvis was performed following the standard protocol without intravenous contrast.  Comparison: Right hip MRI 08/30/2012, whole body bone scan 08/24/2012, right hip radiographs 08/08/2012 and pelvic radiographs 01/06/2012.  Lumbar MRI 05/03/2012.  Findings: As demonstrated on the recent studies, there is a comminuted fracture of the right acetabulum superolaterally with superolateral displacement of a 3.9 cm osseous fragment.  There is a large impaction fracture of the femoral head anteriorly impacting the anterior third of the femoral head.  There are multiple small fracture fragments within the joint as well as extensive heterotopic ossification.  There is diffuse erosion of the  acetabulum. The femoral head is superiorly migrated by approximately 2 cm and also demonstrates mild anterolateral subluxation.  Motion artifact is felt to account for suggested irregularity of the base of the femoral neck (best seen on coronal image 51).  No femoral neck or intertrochanteric fracture is identified.  Remote fractures of the left superior and  inferior pubic rami are again noted.  Although these have not substantially changed from the prior radiographs, both fractures demonstrate incomplete bridging and persistent fracture lines suspicious for chronic nonunion.  In addition, there is evidence of underlying bilateral sacral insufficiency fractures.  These do not appear grossly changed from the MRI which showed no significant sacral edema. Therefore, these may be chronic.  There are mild sacroiliac degenerative changes bilaterally.  Advanced degenerative disc disease is again noted at L5-S1 with vacuum phenomenon and an associated anterolisthesis.  The associated right greater than left foraminal stenosis does not appear significantly changed.  Lesser foraminal narrowing at L4-L5 also appears stable.  IMPRESSION:  1. Comminuted and displaced fracture of the right acetabulum with large impaction fracture involving the right femoral head anteriorly. 2.  Associated diffuse acetabular erosion and extensive heterotopic ossification.  Could the patient have a neuropathic joint? 3.  Chronic fractures of the left superior and inferior pubic rami with incomplete osseous union. Bilateral sacral sufficiency fractures, also likely chronic.   Original Report Authenticated By: Carey Bullocks, M.D.    Mr Brain Wo Contrast  09/22/2012  *RADIOLOGY REPORT*  Clinical Data: Unsteady gait.  Suspected Parkinson's disease.  MRI HEAD WITHOUT CONTRAST  Technique:  Multiplanar, multiecho pulse sequences of the brain and surrounding structures were obtained according to standard protocol without intravenous contrast.   Comparison: MRI brain 09/02/2005.  Findings: The patient had difficulty remaining motionless for the study.  Images are suboptimal.  Small or subtle lesions could be overlooked.  There is no evidence for acute infarction, intracranial hemorrhage, mass lesion, hydrocephalus, or extra-axial fluid.  Moderate to severe atrophy is present.  Subcortical and periventricular white matter signal abnormality representing chronic microvascular ischemic change is fairly extensive. Flow voids are maintained in the major intracranial vessels.  No large vessel infarct.  No foci of chronic hemorrhage.  No worrisome osseous features.  Clear sinuses and mastoids. Negative orbits.  Compared with priors there is progression of atrophy and chronic microvascular ischemic change.  IMPRESSION: Motion degraded exam.  Progression of atrophy and chronic microvascular ischemic change since 2007.  No acute intracranial findings.   Original Report Authenticated By: Davonna Belling, M.D.    Mr Hip Right W Wo Contrast  08/30/2012  *RADIOLOGY REPORT*  Clinical Data: Right hip pain. Abnormal bone scan.  The patient fell on 07/23/2012.  MRI OF THE RIGHT HIP WITHOUT AND WITH CONTRAST  Technique:  Multiplanar, multisequence MR imaging was performed both before and after administration of intravenous contrast.  Contrast: 13mL MULTIHANCE GADOBENATE DIMEGLUMINE 529 MG/ML IV SOLN  Comparison: Radiographs dated 08/08/2012 and bone scan dated 08/24/2012  Findings: There is an acute subcortical fracture of the superior aspect of the right acetabulum.  Fracture line extends into the junction of the acetabulum with the right superior pubic ramus. There is also prominent edema in the posterior aspect of the acetabulum.  There is an impacted fracture of the anterior aspect of the right femoral head.  There are old fractures of the left inferior and superior pubic rami.  There is extensive edema in the superior aspect of the right gluteus medius muscle and  throughout the right gluteus minimus muscle.  There is also prominent edema in the adductor muscles at the right hip.  There is also edema in the lateral aspect of the left gluteus maximus muscle distally consistent with a strain or contusion.  After contrast administration there is enhancement of the right acetabulum and also enhancement of the  injured  muscles.  IMPRESSION:  1.  Acute fractures of the right acetabulum and right femoral head. 2.  Multiple adjacent muscle strains. 3.  Old fractures of the left inferior and superior pubic rami. 4.  CT scan may better define the precise anatomy of the fractures if clinically indicated.   Original Report Authenticated By: Francene Boyers, M.D.    Nm Bone Scan Whole Body  08/24/2012  *RADIOLOGY REPORT*  Clinical Data: Right pelvic and hip pain since falling 07/23/2012. Question fracture.  No history of malignancy.  NUCLEAR MEDICINE WHOLE BODY BONE SCINTIGRAPHY  Technique:  Whole body anterior and posterior images were obtained approximately 3 hours after intravenous injection of radiopharmaceutical.  Radiopharmaceutical: CURIE TC-MDP TECHNETIUM TC 34M MEDRONATE IV KIT  Comparison: Whole body bone scan 09/26/2008.  Office thoracolumbar radiographs 06/24/2012, left rib radiographs 08/03/2012 and right hip radiographs 08/08/2012.  Lumbar MRI 05/03/2012.  Findings: There is new abnormal activity throughout the right superior acetabulum suspicious for a fracture in this context. There is no definite abnormal activity within the right femoral head or femoral neck.  The pelvic activity is otherwise normal. Midline activity inferiorly is attributed to residual activity within the urinary bladder.  There is a convex left thoracolumbar scoliosis.  There is new linear activity near the thoracolumbar junction, probably at L1. This is suspicious for a subacute compression fracture.  There is also increased activity along the lower right thoracic costovertebral junctions.   There is mildly increased activity at the left 8th anterior costal margin which could reflect a subacute fracture.  No other rib abnormalities are seen.  Degenerative activity of the right first metatarsal phalangeal joint is unchanged.  IMPRESSION:  1.  New abnormal activity within the right acetabulum suspicious for a fracture in this clinical context. 2.  Suspected superior endplate compression fracture at L1.  Follow- up radiographs of the pelvis, right hip and lumbar spine recommended. 3.  Activity at the left anterior costal margin could reflect a subacute fracture or costochondritis. Possible additional rib injuries along the inferior right costovertebral margin.   Original Report Authenticated By: Carey Bullocks, M.D.    Dg Pelvis Portable  09/20/2012  *RADIOLOGY REPORT*  Clinical Data: Status post right hip replacement.  PORTABLE PELVIS  Comparison: CT of the pelvis 09/14/2012.  Findings: There has been interval placement of a right total hip prosthesis.  The femoral and acetabular components of the prosthesis both appear to be well seated without definite periprosthetic fracture or other immediate complicating features. Gas is present within the joint space and the overlying soft tissues.  Post-traumatic changes in the left hemi pelvis are again noted, including old healed fractures of the left superior and inferior pubic rami.  IMPRESSION: 1.  Status post right total hip arthroplasty without immediate complicating features, as above.   Original Report Authenticated By: Trudie Reed, M.D.    Dg Hip Portable 1 View Right  09/20/2012  *RADIOLOGY REPORT*  Clinical Data: Right hip arthroplasty.  PORTABLE RIGHT HIP - 1 VIEW  Comparison: Plain films 08/08/2012 and MRI of 08/30/2012.  Findings: Single lateral view of the right hip demonstrates arthroplasty.  No acute hardware complication identified.  No periprosthetic fracture.  IMPRESSION: Expected appearance after right hip arthroplasty.   Original  Report Authenticated By: Jeronimo Greaves, M.D.     Disposition: The patient will be discharged to skilled nursing facility, [facility]. Her pelvic fractures were from 2 falls that occurred at home under supervision. She will need close supervision with physical therapy,  50% weightbearing to the right lower trembly, posterior total hip precautions. 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 Again whenever she is out of bed she needs to be closely supervised to prevent any fall on her right hip.      Discharge Orders   Future Appointments Provider Department Dept Phone   12/23/2012 10:00 AM Newt Lukes, MD [facility] [phone]   Future Orders Complete By Expires     Call MD / Call 911  As directed     Comments:      If you experience chest pain or shortness of breath, CALL 911 and be transported to the hospital emergency room.  If you develope a fever above 101 F, pus (white drainage) or increased drainage or redness at the wound, or calf pain, call your surgeon's office.    Change dressing  As directed     Comments:      You may change your dressing on POD #5    Constipation Prevention  As directed     Comments:      Drink plenty of fluids.  Prune juice may be helpful.  You may use a stool softener, such as Colace (over the counter) 100 mg twice a day.  Use MiraLax (over the counter) for constipation as needed.    Diet - low sodium heart healthy  As directed     Driving restrictions  As directed     Comments:      No driving for 2 weeks    Follow the hip precautions as taught in Physical Therapy  As directed     Comments:      Patient has Parkinson's disease as well as dementia documented by MRI scan. She needs close supervision and should not be out of bed without somebody directly supervising her. If she falls again onto the right hip it is unlikely that further reconstruction will be  possible    Increase activity slowly as tolerated  As directed     Comments:      Patient is to be 50% weightbearing on the right lower trembly with posterior total hip precautions.    Patient may shower  As directed     Scheduling Instructions:      Patient will make close supervision and assistance whenever she is out of bed. Her hip was replaced because of 2 separate falls that resulted in a displaced severe pelvic fracture.    Comments:      You may shower without a dressing once there is no drainage.  Do not wash over the wound.  If drainage remains, cover wound with plastic wrap and then shower.       Follow-up Information   Follow up with Nestor Lewandowsky, MD.   Contact information:   [address] [place] [address] [phone]        Signed: Nestor Lewandowsky 09/23/2012, 7:39 AM

## 2012-09-23 NOTE — Progress Notes (Signed)
Physical Therapy Treatment Patient Details Name: Dawn Ashley MRN: [id number] DOB: 1936-08-01 Today's Date: 09/23/2012 Time: 4098-1191 PT Time Calculation (min): 23 min  PT Assessment / Plan / Recommendation Comments on Treatment Session  Pt s/p R THA.Marland Kitchen Continues to require Min A for balance and stability throughout mobility. Patient able to tolerate increased standing and therex this session. Awaiting SNF bed at this time.     Follow Up Recommendations  SNF     Does the patient have the potential to tolerate intense rehabilitation     Barriers to Discharge        Equipment Recommendations  None recommended by PT    Recommendations for Other Services    Frequency 7X/week   Plan Discharge plan remains appropriate;Frequency remains appropriate    Precautions / Restrictions Precautions Precautions: Posterior Hip;Fall Precaution Comments: Pt able to recall WBing but unable to figure out how much is 50 %. Able to recall 2/3 posterior hip precautions. Re educated pt on all precautions. Restrictions Weight Bearing Restrictions: Yes RLE Weight Bearing: Partial weight bearing RLE Partial Weight Bearing Percentage or Pounds: 50   Pertinent Vitals/Pain no apparent distress     Mobility  Bed Mobility Sitting - Scoot to Edge of Bed: 3: Mod assist Sit to Supine: 3: Mod assist;With rail Details for Bed Mobility Assistance:  (A) to move RLE off bed and to reaise trunck off bed. Cueing for hand placement and technique. Transfers Sit to Stand: 3: Mod assist;With upper extremity assist;From bed;From toilet Stand to Sit: 3: Mod assist;With upper extremity assist;To chair/3-in-1;To toilet Details for Transfer Assistance: A to initate stand and to ensure balance in standing as patient with posterior leaning with cueing to correct. Cues for safe technique and hand placement Ambulation/Gait Ambulation/Gait Assistance: 4: Min assist Ambulation Distance (Feet): 30 Feet Assistive device:  Rolling walker Ambulation/Gait Assistance Details: Cues for technique and steppage and for RW positoning. A for balance and RW management Gait Pattern: Step-to pattern;Decreased stride length;Trunk flexed Gait velocity: decreased    Exercises Total Joint Exercises Gluteal Sets: AROM;Both;10 reps Heel Slides: AAROM;Right;10 reps Hip ABduction/ADduction: AAROM;Right;10 reps Long Arc Quad: AROM;Right;10 reps   PT Diagnosis:    PT Problem List:   PT Treatment Interventions:     PT Goals Acute Rehab PT Goals PT Goal: Supine/Side to Sit - Progress: Progressing toward goal PT Goal: Sit to Stand - Progress: Progressing toward goal PT Goal: Stand to Sit - Progress: Progressing toward goal PT Goal: Ambulate - Progress: Progressing toward goal  Visit Information  Assistance Needed: +1    Subjective Data      Cognition  Cognition Overall Cognitive Status: Appears within functional limits for tasks assessed/performed Arousal/Alertness: Awake/alert Orientation Level: Appears intact for tasks assessed Behavior During Session: [name] for tasks performed    Balance     End of Session PT - End of Session Equipment Utilized During Treatment: Gait belt Activity Tolerance: Patient tolerated treatment well Patient left: in chair;with call bell/phone within reach;with family/visitor present Nurse Communication: Mobility status   GP     Dawn Ashley 09/23/2012, 11:44 AM

## 2012-09-23 NOTE — Clinical Social Work Note (Signed)
CSW consulted re: speaking to son about discharge plans. CSW spoke to the patient who gave permission to speak to son, Ed [phone]). CSW informed son about patient accepting bed offer to [facility], signing paperwork, and when EMS will be called for transport. CSW will call EMS at 1pm for transport and will notify bedside nurse of transportation time. CSW will continue to to follow for discharge.  Lia Foyer, LCSWA [facility] Clinical Social Worker Contact #: [phone]

## 2012-09-24 LAB — TYPE AND SCREEN: Unit division: 0

## 2012-09-25 LAB — ANAEROBIC CULTURE: Gram Stain: NONE SEEN

## 2012-10-01 DIAGNOSIS — M949 Disorder of cartilage, unspecified: Secondary | ICD-10-CM

## 2012-10-01 DIAGNOSIS — M5137 Other intervertebral disc degeneration, lumbosacral region: Secondary | ICD-10-CM

## 2012-10-01 DIAGNOSIS — M25559 Pain in unspecified hip: Secondary | ICD-10-CM

## 2012-10-01 DIAGNOSIS — M79609 Pain in unspecified limb: Secondary | ICD-10-CM

## 2012-10-01 DIAGNOSIS — M899 Disorder of bone, unspecified: Secondary | ICD-10-CM

## 2012-11-15 ENCOUNTER — Encounter: Payer: Self-pay | Admitting: Adult Health

## 2012-11-17 ENCOUNTER — Telehealth: Payer: Self-pay

## 2012-11-17 MED ORDER — DICLOFENAC SODIUM 25 MG PO TBEC: 25.0000 mg | DELAYED_RELEASE_TABLET | Freq: Three times a day (TID) | ORAL | Status: DC | PRN

## 2012-11-17 NOTE — Telephone Encounter (Signed)
I have DC'd voltaren gel and reordered Voltaren 25mg  tabs thanks

## 2012-11-17 NOTE — Telephone Encounter (Signed)
RN called stating that pt recently started home serviced with company and they will be managing her medications. Pt is requesting Voltaren gel to be changed to tablet rather than gel. Verbal order okay?

## 2012-11-18 NOTE — Telephone Encounter (Signed)
Nia RN at living facility advised and verbal D/C given

## 2012-12-02 ENCOUNTER — Telehealth: Payer: Self-pay | Admitting: Internal Medicine

## 2012-12-02 NOTE — Telephone Encounter (Signed)
She needs to be seen.

## 2012-12-02 NOTE — Telephone Encounter (Signed)
Patient Information:  Caller Name: Juanelle  Phone: [phone]  Patient: Dawn Ashley, Dawn Ashley  Gender: Female  DOB: 1935-12-27  Age: 77 Years  PCP: Rene Paci (Adults only)  Office Follow Up:  Does the office need to follow up with this patient?: Yes  Instructions For The Office: Requesting a stronger diuretic for bilateral lower leg edema.  Also spoke with Rosalita Levan,  RN, pt's nurse at [facility].  Her direct # cell # is [phone].   Pt still uses [employer].  Does not have pitting edema.  Please call Pt's RN to advise.  TY!      Symptoms  Reason For Call & Symptoms: Pt is having edema up to her knees.  She is wearing her stockings and taking diurectic.  Does she need stronger diurectic.   Pt had ORIF 09/20/12 by Dr. Merlinda Frederick and he saw her 12/01/12 and he said to call her PCP.  She is on HCTZ./Hydrodiuril 25 mg 1 PO QD  She is in [facility] for Rehab and Assisted Living    Reviewed Health History In EMR: Yes  Reviewed Medications In EMR: Yes  Reviewed Allergies In EMR: Yes  Reviewed Surgeries / Procedures: Yes  Date of Onset of Symptoms: 10/11/2012  Treatments Tried: Support stockings  Treatments Tried Worked: No  Guideline(s) Used:  Leg Swelling and Edema  Disposition Per Guideline:   See Within 3 Days in Office  Reason For Disposition Reached:   Mild swelling of both ankles (i.e., pedal edema) AND new onset or worsening  Advice Given:  Expected Course:  If your leg swelling does not get better during the next week or if it recurs, make an appointment with your doctor.  Call Back If:  Swelling becomes worse  Swelling becomes red or painful to the touch  Calf pain occurs and becomes constant  You become worse.  Patient Refused Recommendation:  Patient Requests Prescription  Requesting a stronger Diuretic RX for lower leg edema.

## 2012-12-03 ENCOUNTER — Ambulatory Visit (INDEPENDENT_AMBULATORY_CARE_PROVIDER_SITE_OTHER): Payer: Medicare Other | Admitting: Internal Medicine

## 2012-12-03 ENCOUNTER — Other Ambulatory Visit (INDEPENDENT_AMBULATORY_CARE_PROVIDER_SITE_OTHER): Payer: Medicare Other

## 2012-12-03 ENCOUNTER — Encounter: Payer: Self-pay | Admitting: Internal Medicine

## 2012-12-03 VITALS — BP 140/72 | HR 79 | Temp 97.4°F | Wt 152.0 lb

## 2012-12-03 DIAGNOSIS — R600 Localized edema: Secondary | ICD-10-CM

## 2012-12-03 DIAGNOSIS — R609 Edema, unspecified: Secondary | ICD-10-CM

## 2012-12-03 DIAGNOSIS — M8080XS Other osteoporosis with current pathological fracture, unspecified site, sequela: Secondary | ICD-10-CM

## 2012-12-03 DIAGNOSIS — IMO0002 Reserved for concepts with insufficient information to code with codable children: Secondary | ICD-10-CM

## 2012-12-03 LAB — URINALYSIS, ROUTINE W REFLEX MICROSCOPIC
Bilirubin Urine: NEGATIVE
Leukocytes, UA: NEGATIVE
Nitrite: NEGATIVE
Specific Gravity, Urine: 1.02 (ref 1.000–1.030)
pH: 7.5 (ref 5.0–8.0)

## 2012-12-03 MED ORDER — DICLOFENAC SODIUM 25 MG PO TBEC: 25.0000 mg | DELAYED_RELEASE_TABLET | Freq: Two times a day (BID) | ORAL | Status: DC | PRN

## 2012-12-03 MED ORDER — FUROSEMIDE 40 MG PO TABS: 40.0000 mg | ORAL_TABLET | Freq: Every day | ORAL | Status: DC

## 2012-12-03 MED ORDER — POTASSIUM CHLORIDE ER 10 MEQ PO TBCR: 10.0000 meq | EXTENDED_RELEASE_TABLET | Freq: Every day | ORAL | Status: DC

## 2012-12-03 NOTE — Assessment & Plan Note (Signed)
DEXA 11/2010 with Gegick: -1.4 MRI L spine 01/2101 with incidental sacral insuff fracture; also R hip fx 08/2012 with accidental fall takes Ca + vit D - started fosamax 07/2012 Continue WB exercise and repeat DEXA 12/2012 to monitor

## 2012-12-03 NOTE — Progress Notes (Signed)
  Subjective:    Patient ID: Dawn Ashley, female    DOB: 28-Sep-1935, 77 y.o.   MRN: [id number]  HPI  Here for acute problem, swelling of legs, knees down, bilateral Onset 3 weeks ago, progressive Unimproved with compression hose Interval history and events reviewed - on scheduled NSAIDS per ortho  Past Medical History  Diagnosis Date  . Mitral valve prolapse   . Menopause   . Osteopenia     sacral insuff fx on MRI L spine 01/2011  . Parkinson disease 01/2005 dx  . Back pain   . Displacement of lumbar intervertebral disc without myelopathy   . Kidney stones   . Osteoporosis with fracture   . Hypertension   . Heart murmur     h/o MVP, pt. reports murmur is almost resolved  . Arthritis     osteoporosis, osteoarthritis   . Neuromuscular disorder     parkinson's disease, followed by Dr. Sandria Manly    Review of Systems  Constitutional: Negative for fever and fatigue.  Respiratory: Negative for cough and shortness of breath.   Cardiovascular: Positive for leg swelling. Negative for chest pain and palpitations.       Objective:   Physical Exam BP 140/72  Pulse 79  Temp(Src) 97.4 F (36.3 C) (Oral)  Wt 152 lb (68.947 kg)  BMI 26.93 kg/m2  SpO2 98% Constitutional: She appears well-developed and well-nourished. No distress.  sitting in WC Neck: Normal range of motion. Neck supple. No JVD present. No thyromegaly present.  Cardiovascular: Normal rate, regular rhythm and normal heart sounds.  No murmur heard. 1+ pitting BLE edema knee down. Pulmonary/Chest: Effort normal and breath sounds normal. No respiratory distress. She has no wheezes. Neurological: She is alert and oriented to person, place, and time. No cranial nerve deficit. Coordination and speech are normal.  Skin: Skin is warm and dry. No rash noted. No erythema.  Psychiatric: She has a normal mood and affect. Her behavior is normal. Judgment and thought content normal.   Lab Results  Component Value Date   WBC 11.3*  09/23/2012   HGB 7.9* 09/23/2012   HCT 24.0* 09/23/2012   PLT 201 09/23/2012   GLUCOSE 113* 09/21/2012   CHOL 192 02/10/2012   TRIG 29.0 02/10/2012   HDL 80.50 02/10/2012   LDLCALC 106* 02/10/2012   ALT 21 01/21/2012   AST 30 01/21/2012   NA 138 09/21/2012   K 4.5 09/21/2012   CL 108 09/21/2012   CREATININE 0.80 09/21/2012   BUN 23 09/21/2012   CO2 23 09/21/2012   TSH 2.74 01/21/2012   INR 0.97 09/15/2012       Assessment & Plan:   BLE edema - mild volume overload peripherally, no evidence for pulmonary edema on history or exam Check labs including urine Change diuretic from HCTZ to Lasix 40 mg daily, and potassium 10 mEq with each furosemide pill -erx done Reviewed 2d echo 02/2012 - mild grade 1 diastolic dysfunction, LVEF 60-65%; Will scheduled a repeat now given new edema Wean off scheduled NSAIDs as tolerated, patient to discuss with orthopedics Continue compression hose as needed

## 2012-12-03 NOTE — Patient Instructions (Signed)
It was good to see you today. We have reviewed your prior records including labs and tests today Stop hydrochlorothiazide ( current fluid pill) Begin stronger fluid pill furosemide 40 mg once daily each morning. Also take potassium 10 medical and pill with every furosemide each morning Your prescription(s) have been submitted to your pharmacy. Please take as directed and contact our office if you believe you are having problem(s) with the medication(s). Test(s) ordered today. Your results will be released to MyChart (or called to you) after review, usually within 72hours after test completion. If any changes need to be made, you will be notified at that same time. we'll make referral For repeat cardiac ultrasound to evaluate for heart problems causing fluid swelling . Our office will contact you regarding appointment(s) once made. Work with Dr. Orvis Brill to reduce Voltaren as tolerated as this medication may be exacerbating fluid retention Please schedule followup in 3-4 months, call sooner if problems.  Edema Edema is an abnormal build-up of fluids in tissues. Because this is partly dependent on gravity (water flows to the lowest place), it is more common in the legs and thighs (lower extremities). It is also common in the looser tissues, like around the eyes. Painless swelling of the feet and ankles is common and increases as a person ages. It may affect both legs and may include the calves or even thighs. When squeezed, the fluid may move out of the affected area and may leave a dent for a few moments. CAUSES   Prolonged standing or sitting in one place for extended periods of time. Movement helps pump tissue fluid into the veins, and absence of movement prevents this, resulting in edema.  Varicose veins. The valves in the veins do not work as well as they should. This causes fluid to leak into the tissues.  Fluid and salt overload.  Injury, burn, or surgery to the leg, ankle, or foot, may  damage veins and allow fluid to leak out.  Sunburn damages vessels. Leaky vessels allow fluid to go out into the sunburned tissues.  Allergies (from insect bites or stings, medications or chemicals) cause swelling by allowing vessels to become leaky.  Protein in the blood helps keep fluid in your vessels. Low protein, as in malnutrition, allows fluid to leak out.  Hormonal changes, including pregnancy and menstruation, cause fluid retention. This fluid may leak out of vessels and cause edema.  Medications that cause fluid retention. Examples are sex hormones, blood pressure medications, steroid treatment, or anti-depressants.  Some illnesses cause edema, especially heart failure, kidney disease, or liver disease.  Surgery that cuts veins or lymph nodes, such as surgery done for the heart or for breast cancer, may result in edema. DIAGNOSIS  Your caregiver is usually easily able to determine what is causing your swelling (edema) by simply asking what is wrong (getting a history) and examining you (doing a physical). Sometimes x-rays, EKG (electrocardiogram or heart tracing), and blood work may be done to evaluate for underlying medical illness. TREATMENT  General treatment includes:  Leg elevation (or elevation of the affected body part).  Restriction of fluid intake.  Prevention of fluid overload.  Compression of the affected body part. Compression with elastic bandages or support stockings squeezes the tissues, preventing fluid from entering and forcing it back into the blood vessels.  Diuretics (also called water pills or fluid pills) pull fluid out of your body in the form of increased urination. These are effective in reducing the swelling, but can  have side effects and must be used only under your caregiver's supervision. Diuretics are appropriate only for some types of edema. The specific treatment can be directed at any underlying causes discovered. Heart, liver, or kidney disease  should be treated appropriately. HOME CARE INSTRUCTIONS   Elevate the legs (or affected body part) above the level of the heart, while lying down.  Avoid sitting or standing still for prolonged periods of time.  Avoid putting anything directly under the knees when lying down, and do not wear constricting clothing or garters on the upper legs.  Exercising the legs causes the fluid to work back into the veins and lymphatic channels. This may help the swelling go down.  The pressure applied by elastic bandages or support stockings can help reduce ankle swelling.  A low-salt diet may help reduce fluid retention and decrease the ankle swelling.  Take any medications exactly as prescribed. SEEK MEDICAL CARE IF:  Your edema is not responding to recommended treatments. SEEK IMMEDIATE MEDICAL CARE IF:   You develop shortness of breath or chest pain.  You cannot breathe when you lay down; or if, while lying down, you have to get up and go to the window to get your breath.  You are having increasing swelling without relief from treatment.  You develop a fever over 102 F (38.9 C).  You develop pain or redness in the areas that are swollen.  Tell your caregiver right away if you have gained 3 lb/1.4 kg in 1 day or 5 lb/2.3 kg in a week. MAKE SURE YOU:   Understand these instructions.  Will watch your condition.  Will get help right away if you are not doing well or get worse. Document Released: 07/21/2005 Document Revised: 01/20/2012 Document Reviewed: 03/08/2008 [facility] Patient Information 2013 [place], [place].

## 2012-12-03 NOTE — Assessment & Plan Note (Signed)
New onset 01/2012 - mild -mod in BLEs only - started HCTZ for same Increasing symptoms since 10/2012 Change diuretic now - stop hctz to furosemide 40qd + kcl 10 qd check labs and repeat Echo now Reviewed 2d Echo 02/2012: grade 1 diast CHF, normal LVEF

## 2012-12-06 ENCOUNTER — Encounter: Payer: Self-pay | Admitting: Neurology

## 2012-12-06 ENCOUNTER — Ambulatory Visit (INDEPENDENT_AMBULATORY_CARE_PROVIDER_SITE_OTHER): Payer: Medicare Other | Admitting: Neurology

## 2012-12-06 VITALS — BP 160/70 | HR 73 | Temp 97.5°F | Ht 61.25 in | Wt 150.0 lb

## 2012-12-06 DIAGNOSIS — Z96649 Presence of unspecified artificial hip joint: Secondary | ICD-10-CM

## 2012-12-06 DIAGNOSIS — G20 Parkinson's disease: Secondary | ICD-10-CM

## 2012-12-06 DIAGNOSIS — S72141A Displaced intertrochanteric fracture of right femur, initial encounter for closed fracture: Secondary | ICD-10-CM | POA: Insufficient documentation

## 2012-12-06 DIAGNOSIS — R269 Unspecified abnormalities of gait and mobility: Secondary | ICD-10-CM

## 2012-12-06 DIAGNOSIS — R400 Somnolence: Secondary | ICD-10-CM | POA: Insufficient documentation

## 2012-12-06 LAB — BASIC METABOLIC PANEL
BUN: 30 mg/dL — ABNORMAL HIGH (ref 6–23)
Calcium: 9 mg/dL (ref 8.4–10.5)
Creatinine, Ser: 1 mg/dL (ref 0.4–1.2)
GFR: 59.51 mL/min — ABNORMAL LOW (ref >60.00)
Glucose, Bld: 102 mg/dL — ABNORMAL HIGH (ref 70–99)

## 2012-12-06 LAB — HEPATIC FUNCTION PANEL: Albumin: 3.9 g/dL (ref 3.5–5.2)

## 2012-12-06 NOTE — Progress Notes (Signed)
Subjective:    Patient ID: Dawn Ashley is a 77 y.o. female.  HPI  Interim history:   Dawn Ashley is a 77 year-old LH lady, who presents for followup consultation of her Parkinson's disease, left-sided predominant. The patient is accompanied by her son today. This is the first visit with me and she previously followed with Dr. Avie Echevaria and saw him last on 09/08/2012, at which time he felt that she was very sleepy and he discontinued tramadol. He also  obtain blood work including CBC, CMP, TSH and urinalysis as well as B12 level and ordered a brain MRI. She had problems with her gait which he primarily attributed to right hip pain. Her falls assessment we'll score at the time is 25. She has an underlying medical history of carotid artery disease, lumbar spine disease with multilevel degenerative joint disease and hypertrophic facet arthritis. She is status post back surgery in 2008 as well as cataract surgery.  Voltaren gel, alendronate, gabapentin 900 mg daily, hydrochlorothiazide, Celebrex, Premarin, Mirapex 0.5 mg 3 times a day, rasagiline 1 mg once daily, low-dose aspirin, calcium and vitamin D. Her blood work showed mild elevated alkaline phosphatase and BUN of 28 and creatinine of 1.17. Her son was called back and advised that she should increase her fluid intake. Her urinalysis was not consistent with clear-cut UTI. Since her last visit with Dr. love she had right hip replacement surgery. She has been in rehabilitation. She did not end up getting her brain scan because she had surgery soon after her last visit and was in rehabilitation since then. Her sleepiness and confusion improved after her pain medication was changed from tramadol to diclofenac. She reports doing well since her R THR on 2/17. She is situated in her wheelchair and has just now started walking with a 2 wheeled walker with physical therapy. About a month ago she moved to [facility] in independent living. She is getting  physical therapy soon 3 days a week. She also gets 2 more sessions in occupational therapy and some speech therapy at this time.   she has finished inpatient rehabilitation. She has developed some lower extremity swelling for which she saw her primary care physician. She is being switched from hydrochlorothiazide to Lasix. She's also advised to taper off her nonsteroidal anti-inflammatory medicine. She continues to take her Parkinson's medicine without problems. She has never had largely swelling from the Mirapex. She is supposed to get a repeat echocardiogram due to the lower extremity edema. She is wearing compression stockings at this time. She did not bring her walker today.   Her memory is doing fairly well. She has no other new complaints today. Her MMSE was 27/30 3 months ago.    Her Past Medical History Is Significant For: Past Medical History  Diagnosis Date  . Mitral valve prolapse   . Menopause   . Osteopenia     sacral insuff fx on MRI L spine 01/2011  . Parkinson disease 01/2005 dx  . Back pain   . Displacement of lumbar intervertebral disc without myelopathy   . Kidney stones   . Osteoporosis with fracture   . Hypertension   . Heart murmur     h/o MVP, pt. reports murmur is almost resolved  . Arthritis     osteoporosis, osteoarthritis   . Neuromuscular disorder     parkinson's disease, followed by Dr. Sandria Manly    Her Past Surgical History Is Significant For: Past Surgical History  Procedure Laterality Date  .  Tonsillectomy    . Cataract extraction w/ intraocular lens  implant, bilateral  2011  2004  . Dilation and curettage of uterus  2012  . Back surgery  2009    spurs resting on nerve, lumbar  . Total hip arthroplasty Right 09/20/2012    Procedure: TOTAL HIP ARTHROPLASTY;  Surgeon: Nestor Lewandowsky, MD;  Location: [facility] OR;  Service: Orthopedics;  Laterality: Right;  . Orif pelvic fracture Right 09/20/2012    Procedure: OPEN REDUCTION INTERNAL FIXATION (ORIF) PELVIC FRACTURE;   Surgeon: Nestor Lewandowsky, MD;  Location: [facility] OR;  Service: Orthopedics;  Laterality: Right;    Her Family History Is Significant For: Family History  Problem Relation Age of Onset  . Breast cancer Maternal Grandmother   . Arthritis Maternal Grandmother   . Hypertension Other   . Osteoporosis Other   . Breast cancer Mother     had mastectomy in 1982  . Heart disease Mother   . Stroke Mother     mild, 2001  . Hypertension Mother   . Osteoporosis Mother     also had spine surgery 1987, & hip repalcement  . Arthritis Sister   . Hypertension Sister   . Parkinsonism Maternal Uncle   . Arthritis Maternal Grandfather     Her Social History Is Significant For: History   Social History  . Marital Status: Widowed    Spouse Name: N/A    Number of Children: N/A  . Years of Education: N/A   Social History Main Topics  . Smoking status: Never Smoker   . Smokeless tobacco: Never Used  . Alcohol Use: 1.2 oz/week    2 Glasses of wine per week     Comment: glass of wine, occas.  . Drug Use: No  . Sexually Active: Not on file   Other Topics Concern  . Not on file   Social History Narrative  . No narrative on file    Her Allergies Are:  Allergies  Allergen Reactions  . Tramadol Other (See Comments)    Confusion and sleepy  :   Her Current Medications Are:  Outpatient Encounter Prescriptions as of 12/06/2012  Medication Sig Dispense Refill  . alendronate (FOSAMAX) 70 MG tablet Take 70 mg by mouth every Saturday at 6 PM. Take with a full glass of water on an empty stomach.      Marland Kitchen aspirin 81 MG EC tablet Take 81 mg by mouth daily.        Marland Kitchen aspirin EC 325 MG EC tablet Take 1 tablet (325 mg total) by mouth 2 (two) times daily.  30 tablet  0  . Calcium Carbonate-Vitamin D (CALCIUM + D PO) Take 1 tablet by mouth 2 (two) times daily.      . Cholecalciferol (VITAMIN D) 2000 UNITS tablet Take 4,000 Units by mouth daily.      Marland Kitchen conjugated estrogens (PREMARIN) vaginal cream Place 1 g  vaginally 2 (two) times a week. Every Monday and Thursday.      . diclofenac (VOLTAREN) 25 MG EC tablet Take 1 tablet (25 mg total) by mouth 2 (two) times daily as needed.  90 tablet  1  . furosemide (LASIX) 40 MG tablet Take 1 tablet (40 mg total) by mouth daily.  30 tablet  3  . gabapentin (NEURONTIN) 300 MG capsule Take 300 mg by mouth 3 (three) times daily.       Marland Kitchen oxyCODONE (OXY IR/ROXICODONE) 5 MG immediate release tablet Take 1-2 tablets (5-10 mg total)  by mouth every 3 (three) hours as needed.  60 tablet  0  . potassium chloride (K-DUR) 10 MEQ tablet Take 1 tablet (10 mEq total) by mouth daily.  30 tablet  3  . pramipexole (MIRAPEX) 0.5 MG tablet Take 0.5 mg by mouth 3 (three) times daily.       . rasagiline (AZILECT) 1 MG TABS Take 1 mg by mouth daily.       No facility-administered encounter medications on file as of 12/06/2012.  :   Review of Systems  As above.   Objective:  Neurologic Exam  Physical Exam Physical Examination:   Filed Vitals:   12/06/12 1141  BP: 160/70  Pulse: 73  Temp: 97.5 F (36.4 C)    General Examination: The patient is a very pleasant 77 y.o. female in no acute distress.  HEENT: Normocephalic, atraumatic, pupils are equal, round and reactive to light and accommodation. Funduscopic exam is normal with sharp disc margins noted. Extraocular tracking shows mild saccadic breakdown without nystagmus noted. There is no limitation to her gaze. There is mild decrease in eye blink rate. Hearing is intact. Tympanic membranes are clear bilaterally. Face is symmetric with mild facial masking and normal facial sensation. There is no lip, neck or jaw tremor. Neck is mildly rigid with intact passive ROM. There are no carotid bruits on auscultation. Oropharynx exam reveals mild mouth dryness. No significant airway crowding is noted. Mallampati is class II. Tongue protrudes centrally and palate elevates symmetrically.   There is no drooling.   Chest: is clear to  auscultation without wheezing, rhonchi or crackles noted.  Heart: sounds are regular and normal without murmurs, rubs or gallops noted.   Abdomen: is soft, non-tender and non-distended with normal bowel sounds appreciated on auscultation.  Extremities: There is no pitting edema in the distal lower extremities bilaterally. Pedal pulses are intact.  Skin: is warm and dry with no trophic changes noted.  Musculoskeletal: exam reveals no obvious joint deformities, tenderness or joint swelling or erythema.  Neurologically:  Mental status: The patient is awake and alert, paying good  attention. She is able to completely provide the history. Her son provides details. She is oriented to: person, place, situation, day of week, month of year and year. Her memory, attention, language and knowledge are intact. There is no aphasia, agnosia, apraxia or anomia. There is a no bradyphrenia. Speech is mildly hypophonic with no dysarthria noted. Mood is congruent and affect is normal.   Cranial nerves are as described above under HEENT exam. In addition, shoulder shrug is normal with equal shoulder height noted.  Motor exam: Normal bulk, and strength for age is noted. There are no dyskinesias noted. Tone is mildly rigid with absence of cogwheeling in the bilateral extremities. There is overall mild bradykinesia. There is no drift or rebound. There is no tremor. Romberg was not tested. She did not bring her walker and is situated in her WC. Reflexes are 1+ in the upper extremities and 1+ in the lower extremities. Toes are downgoing bilaterally. Fine motor skills exam reveals: Finger taps are mildly impaired on the right and mildly impaired on the left. Hand movements are mildly impaired on the right and mildly impaired on the left. RAP (rapid alternating patting) is mildly impaired on the right and moderately impaired on the left. Foot taps are mildly impaired on the right and moderately impaired on the left. Foot  agility (in the form of heel stomping) is mildly impaired on the right and  mildly impaired on the left.    Cerebellar testing shows no dysmetria or intention tremor on finger to nose testing. Heel to shin is unremarkable bilaterally. There is no truncal or gait ataxia.   Sensory exam is intact to light touch, pinprick, vibration, temperature sense and proprioception in the upper and lower extremities.   Gait, station and balance exan: She was not asked to stand or walk as she did not bring her walker and has not been released by PT yet. She is noted to slightly lean to the L in the WC.   Assessment and Plan:   Assessment and Plan:  In summary, AFTAN VINT is a very pleasant 77 y.o.-year old female with a history of left-sided predominant Parkinson's disease diagnosed in 2006 but with symptoms dating back to a few years prior to that. She has remained fairly stable and has recently had a right total hip replacement surgery and has recovered well from it. She did not think she had a setback as far as her Parkinson's symptoms are concerned. She is still in outpatient therapy which I encouraged. She is advised to keep well hydrated and follow the instructions of therapy professionals. I would like to reevaluate her in 3 months, sooner if the need arises. She did not require refills today. I encouraged her to continue with the current medication regimen for her PD. She and her son were in agreement. They are encouraged to call with any interim questions, concerns, problems or updates and refill requests.

## 2012-12-06 NOTE — Patient Instructions (Addendum)
I think overall you are doing fairly well and are stable at this point.  I do have some generic suggestions for you today:   Please make sure that you drink plenty of fluids. I would like for you to exercise daily for example in the form of walking 20-30 minutes every day, if you can. Please keep a regular sleep-wake schedule, keep regular meal times, do not skip any meals, eat  healthy snacks in between meals, such as fruit or nuts. Try to eat protein with every meal.   Engage in social activities in your community and with your family and try to keep up with current events by reading the newspaper or watching the news.  I do not think we need to make any changes in your medications at this point. I would like to see you back in 3 months, sooner if we need to. Please call us if you have any interim questions, concerns, or problems or updates to need to discuss.  Brett Canales is my clinical assistant and will answer any of your questions and relay your messages to me and will give you my messages.   Our phone number is [phone]. We also have an after hours call service for urgent matters and there is a physician on-call for urgent questions. For any emergencies you know to call 911 or go to the nearest emergency room.

## 2012-12-15 ENCOUNTER — Ambulatory Visit
Admission: RE | Admit: 2012-12-15 | Discharge: 2012-12-15 | Disposition: A | Discharge status: Home / Self Care | Payer: Medicare Other | Source: Ambulatory Visit | Attending: Internal Medicine | Admitting: Internal Medicine

## 2012-12-15 DIAGNOSIS — M8080XS Other osteoporosis with current pathological fracture, unspecified site, sequela: Secondary | ICD-10-CM

## 2012-12-16 ENCOUNTER — Telehealth: Payer: Self-pay | Admitting: *Deleted

## 2012-12-16 ENCOUNTER — Other Ambulatory Visit: Payer: Self-pay

## 2012-12-16 ENCOUNTER — Ambulatory Visit (HOSPITAL_COMMUNITY): Payer: Medicare Other | Attending: Cardiology | Admitting: Radiology

## 2012-12-16 DIAGNOSIS — R609 Edema, unspecified: Secondary | ICD-10-CM | POA: Insufficient documentation

## 2012-12-16 DIAGNOSIS — R011 Cardiac murmur, unspecified: Secondary | ICD-10-CM | POA: Insufficient documentation

## 2012-12-16 DIAGNOSIS — I080 Rheumatic disorders of both mitral and aortic valves: Secondary | ICD-10-CM | POA: Insufficient documentation

## 2012-12-16 DIAGNOSIS — R600 Localized edema: Secondary | ICD-10-CM

## 2012-12-16 DIAGNOSIS — I10 Essential (primary) hypertension: Secondary | ICD-10-CM | POA: Insufficient documentation

## 2012-12-16 DIAGNOSIS — I079 Rheumatic tricuspid valve disease, unspecified: Secondary | ICD-10-CM | POA: Insufficient documentation

## 2012-12-16 MED ORDER — FLUCONAZOLE 150 MG PO TABS: 150.0000 mg | ORAL_TABLET | Freq: Once | ORAL | Status: DC

## 2012-12-16 NOTE — Telephone Encounter (Signed)
Left msg on vm pt having yeast infection sxs. Could md rx something or does pt need to be seen. Per md ok to rx Diflucan 150 mg...lmb  Notified Mia with md response. Sending rx to [facility]...lmb

## 2012-12-16 NOTE — Progress Notes (Signed)
Echocardiogram performed.  

## 2012-12-20 ENCOUNTER — Ambulatory Visit (INDEPENDENT_AMBULATORY_CARE_PROVIDER_SITE_OTHER)
Admission: RE | Admit: 2012-12-20 | Discharge: 2012-12-20 | Disposition: A | Discharge status: Home / Self Care | Payer: Medicare Other | Source: Ambulatory Visit | Attending: Internal Medicine | Admitting: Internal Medicine

## 2012-12-20 DIAGNOSIS — M810 Age-related osteoporosis without current pathological fracture: Secondary | ICD-10-CM

## 2012-12-20 DIAGNOSIS — IMO0002 Reserved for concepts with insufficient information to code with codable children: Secondary | ICD-10-CM

## 2012-12-23 ENCOUNTER — Ambulatory Visit: Payer: Medicare Other | Admitting: Internal Medicine

## 2012-12-28 ENCOUNTER — Encounter: Payer: Self-pay | Admitting: Internal Medicine

## 2013-01-06 ENCOUNTER — Telehealth: Payer: Self-pay | Admitting: *Deleted

## 2013-01-06 MED ORDER — ASPIRIN 81 MG PO TBEC: 81.0000 mg | DELAYED_RELEASE_TABLET | Freq: Every day | ORAL | Status: DC

## 2013-01-06 NOTE — Telephone Encounter (Signed)
The aspirin 325 twice daily was prescribed by Dr. Turner Daniels in 09/2012 - no longer needed at this dose. Okay for patient to take aspirin 81 mg once daily at this time Medication list updated to reflect same

## 2013-01-06 NOTE — Telephone Encounter (Signed)
Left msg on triage stating need to clarify dosage on pt aspirin. On her last instruction sheet both 81mg  & 325 mg was noted...Raechel Chute

## 2013-01-06 NOTE — Telephone Encounter (Signed)
Notified Mia with md response. Wanting rx for the 81 mg sent to [facility]...lmb

## 2013-01-14 ENCOUNTER — Telehealth: Payer: Self-pay | Admitting: Internal Medicine

## 2013-01-14 NOTE — Telephone Encounter (Signed)
Patient Information:  Caller Name: Chriselda  Phone: [phone]  Patient: Dawn Ashley, Dawn Ashley  Gender: Female  DOB: 1936-07-13  Age: 77 Years  PCP: Rene Paci (Adults only)  Office Follow Up:  Does the office need to follow up with this patient?: No  Instructions For The Office: N/A  RN Note:  Patient states she was advised to wear knee high compression stockings for periperal edema 10/2012. Patient states the stockings are tight behind her knees and cause ridges from the pressure. States no increase in peripheral edema. Denies any skin tears or lesions. Denies lower leg or calf pain. States ankle swelling has improved. Patient advised to fold the elastic over to avoid pressure around or behind knee. Patient advised to avoid any constriction around any portion of leg. Patient advised, if no improvement with above method, to inquire about purchasing a pair of compression stockings in the next larger size. Patient advised to return call if any leg pain, increased swelling or if new symptoms develop. Call back parameters reviewed. Patient verbalizes understanding.  Symptoms  Reason For Call & Symptoms: Compression hose are tight around back of knee  Reviewed Health History In EMR: Yes  Reviewed Medications In EMR: Yes  Reviewed Allergies In EMR: Yes  Reviewed Surgeries / Procedures: Yes  Date of Onset of Symptoms: 10/02/2012  Guideline(s) Used:  No Protocol Available - Information Only  Disposition Per Guideline:   Home Care  Reason For Disposition Reached:   Information only question and nurse able to answer  Advice Given:  Call Back If:  New symptoms develop  You become worse.  Patient Will Follow Care Advice:  YES

## 2013-02-03 ENCOUNTER — Telehealth: Payer: Self-pay | Admitting: Neurology

## 2013-02-03 MED ORDER — PRAMIPEXOLE DIHYDROCHLORIDE 0.5 MG PO TABS: 0.5000 mg | ORAL_TABLET | Freq: Three times a day (TID) | ORAL | Status: DC

## 2013-02-03 NOTE — Telephone Encounter (Signed)
Former Love patient assigned to Dr Frances Furbish.  Has appt in Sept

## 2013-02-16 ENCOUNTER — Other Ambulatory Visit: Payer: Self-pay

## 2013-02-16 DIAGNOSIS — Z1231 Encounter for screening mammogram for malignant neoplasm of breast: Secondary | ICD-10-CM

## 2013-02-24 ENCOUNTER — Other Ambulatory Visit: Payer: Self-pay | Admitting: *Deleted

## 2013-02-24 MED ORDER — FUROSEMIDE 40 MG PO TABS: 40.0000 mg | ORAL_TABLET | Freq: Every day | ORAL | Status: DC

## 2013-02-24 MED ORDER — ALENDRONATE SODIUM 70 MG PO TABS: 70.0000 mg | ORAL_TABLET | ORAL | Status: DC

## 2013-03-08 ENCOUNTER — Ambulatory Visit (INDEPENDENT_AMBULATORY_CARE_PROVIDER_SITE_OTHER): Payer: Medicare Other | Admitting: Neurology

## 2013-03-08 ENCOUNTER — Encounter: Payer: Self-pay | Admitting: Neurology

## 2013-03-08 VITALS — BP 140/69 | HR 71 | Temp 97.8°F | Ht 61.25 in | Wt 150.0 lb

## 2013-03-08 DIAGNOSIS — Z96649 Presence of unspecified artificial hip joint: Secondary | ICD-10-CM

## 2013-03-08 DIAGNOSIS — G20 Parkinson's disease: Secondary | ICD-10-CM

## 2013-03-08 MED ORDER — RASAGILINE MESYLATE 1 MG PO TABS: 1.0000 mg | ORAL_TABLET | Freq: Every day | ORAL | Status: DC

## 2013-03-08 MED ORDER — PRAMIPEXOLE DIHYDROCHLORIDE ER 1.5 MG PO TB24: 1.5000 mg | ORAL_TABLET | Freq: Every day | ORAL | Status: DC

## 2013-03-08 NOTE — Patient Instructions (Addendum)
I am switching you to the long acting pramipexole 1.5 mg once daily each night as opposed to 0.5 mg three times a day to see if your daytime sleepiness improves. If this does not work out, we may have switch you back to your old regimen.  Remember to drink plenty of fluid, eat healthy meals and do not skip any meals. Try to eat protein with a every meal and eat a healthy snack such as fruit or nuts in between meals. Try to keep a regular sleep-wake schedule and try to exercise daily, particularly in the form of walking, 20-30 minutes a day, if you can.    I would like to see you back in 6 months, sooner if we need to. Please call us with any interim questions, concerns, problems, updates or refill requests.  Brett Canales is my clinical assistant and will answer any of your questions and relay your messages to me and also relay most of my messages to you.  Our phone number is [phone]. We also have an after hours call service for urgent matters and there is a physician on-call for urgent questions. For any emergencies you know to call 911 or go to the nearest emergency room.

## 2013-03-08 NOTE — Progress Notes (Signed)
Subjective:    Patient ID: Dawn Ashley is a 77 y.o. female.  HPI  Interim history:   Dawn Ashley is a 77 year-old LH lady, who presents for followup consultation of her L sided predominant Parkinson's disease. The patient is unaccompanied today. I first met her on 12/06/12 at which time I did not change her medications. She previously followed with Dr. Avie Echevaria and was last seen by him on 09/08/2012, which time he felt that her sleepiness was in part d/t tramadol and stopped it. He also  obtained blood work including CBC, CMP, TSH and urinalysis as well as B12 level and ordered a brain MRI. She had problems with her gait which he primarily attributed to right hip pain.  She has an underlying medical history of carotid artery disease, lumbar spine disease with multilevel degenerative joint disease and hypertrophic facet arthritis. She is status post back surgery in 2008 as well as cataract surgery. Her blood work showed mild elevated alkaline phosphatase and BUN of 28 and creatinine of 1.17. Her son was called back and advised that she should increase her fluid intake. Her urinalysis was not consistent with clear-cut UTI. Since her last visit with Dr. Sandria Manly she had right hip replacement surgery on 09/20/12. She has been in rehabilitation. She did not end up getting her brain scan because she had surgery soon after her last visit and was in rehabilitation since then. Her sleepiness and confusion improved after her pain medication was changed from tramadol to diclofenac. She moved to [facility]. She had developed some LE swelling and was switched from HCTZ to Lasix and advised to reduce her NSAIDs.  She reports EDS in the past 2 months. She has been on as needed pain medication, but hardly uses it. She has a check up for her R hip tomorrow. She has started Fosamax a couple of months ago, but I am not aware of sleepiness as a SE of this medicine. She is going to exercises at [facility] and has finished therapy for her hip.  Her Past Medical History Is Significant For: Past Medical History  Diagnosis Date  . Mitral valve prolapse   . Menopause   . Osteopenia     sacral insuff fx on MRI L spine 01/2011  . Parkinson disease 01/2005 dx  . Back pain   . Displacement of lumbar intervertebral disc without myelopathy   . Kidney stones   . Osteoporosis with fracture   . Hypertension   . Heart murmur     h/o MVP, pt. reports murmur is almost resolved  . Arthritis     osteoporosis, osteoarthritis   . Neuromuscular disorder     parkinson's disease, followed by Dr. Sandria Manly    Her Past Surgical History Is Significant For: Past Surgical History  Procedure Laterality Date  . Tonsillectomy    . Cataract extraction w/ intraocular lens  implant, bilateral  2011  2004  . Dilation and curettage of uterus  2012  . Back surgery  2009    spurs resting on nerve, lumbar  . Total hip arthroplasty Right 09/20/2012    Procedure: TOTAL HIP ARTHROPLASTY;  Surgeon: Nestor Lewandowsky, MD;  Location: [facility] OR;  Service: Orthopedics;  Laterality: Right;  . Orif pelvic fracture Right 09/20/2012    Procedure: OPEN REDUCTION INTERNAL FIXATION (ORIF) PELVIC FRACTURE;  Surgeon: Nestor Lewandowsky, MD;  Location: [facility] OR;  Service: Orthopedics;  Laterality: Right;    Her Family History Is  Significant For: Family History  Problem Relation Age of Onset  . Breast cancer Maternal Grandmother   . Arthritis Maternal Grandmother   . Hypertension Other   . Osteoporosis Other   . Breast cancer Mother     had mastectomy in 1982  . Heart disease Mother   . Stroke Mother     mild, 2001  . Hypertension Mother   . Osteoporosis Mother     also had spine surgery 1987, & hip repalcement  . Arthritis Sister   . Hypertension Sister   . Parkinsonism Maternal Uncle   . Arthritis Maternal Grandfather     Her Social History Is Significant For: History   Social History  . Marital Status: Widowed    Spouse Name:  N/A    Number of Children: N/A  . Years of Education: N/A   Social History Main Topics  . Smoking status: Never Smoker   . Smokeless tobacco: Never Used  . Alcohol Use: 1.2 oz/week    2 Glasses of wine per week     Comment: glass of wine, occas.  . Drug Use: No  . Sexually Active: None   Other Topics Concern  . None   Social History Narrative  . None    Her Allergies Are:  Allergies  Allergen Reactions  . Tramadol Other (See Comments)    Confusion and sleepy  :   Her Current Medications Are:  Outpatient Encounter Prescriptions as of 03/08/2013  Medication Sig Dispense Refill  . alendronate (FOSAMAX) 70 MG tablet Take 1 tablet (70 mg total) by mouth every Saturday at 6 PM. Take with a full glass of water on an empty stomach.  12 tablet  3  . aspirin 81 MG EC tablet Take 1 tablet (81 mg total) by mouth daily.  30 tablet  11  . Calcium Carbonate-Vitamin D (CALCIUM + D PO) Take 1 tablet by mouth 2 (two) times daily.      . Cholecalciferol (VITAMIN D) 2000 UNITS tablet Take 4,000 Units by mouth daily.      Marland Kitchen conjugated estrogens (PREMARIN) vaginal cream Place 1 g vaginally 2 (two) times a week. Every Monday and Thursday.      . fluconazole (DIFLUCAN) 150 MG tablet Take 1 tablet (150 mg total) by mouth once.  2 tablet  0  . furosemide (LASIX) 40 MG tablet Take 1 tablet (40 mg total) by mouth daily.  90 tablet  3  . gabapentin (NEURONTIN) 300 MG capsule Take 300 mg by mouth 3 (three) times daily.       Marland Kitchen oxyCODONE (OXY IR/ROXICODONE) 5 MG immediate release tablet Take 1-2 tablets (5-10 mg total) by mouth every 3 (three) hours as needed.  60 tablet  0  . potassium chloride (K-DUR) 10 MEQ tablet Take 1 tablet (10 mEq total) by mouth daily.  30 tablet  3  . rasagiline (AZILECT) 1 MG TABS tablet Take 1 tablet (1 mg total) by mouth daily.  90 tablet  3  . [DISCONTINUED] pramipexole (MIRAPEX) 0.5 MG tablet Take 1 tablet (0.5 mg total) by mouth 3 (three) times daily.  90 tablet  0  .  [DISCONTINUED] rasagiline (AZILECT) 1 MG TABS Take 1 mg by mouth daily.      . diclofenac (VOLTAREN) 25 MG EC tablet Take 1 tablet (25 mg total) by mouth 2 (two) times daily as needed.  90 tablet  1  . Pramipexole Dihydrochloride (MIRAPEX ER) 1.5 MG TB24 Take 1 tablet (1.5 mg total) by  mouth at bedtime.  30 tablet  2   No facility-administered encounter medications on file as of 03/08/2013.   Review of Systems  Cardiovascular: Positive for leg swelling.  Neurological: Positive for tremors.    Objective:  Neurologic Exam  Physical Exam Physical Examination:   Filed Vitals:   03/08/13 1607  BP: 140/69  Pulse: 71  Temp: 97.8 F (36.6 C)    General Examination: The patient is a very pleasant 77 y.o. female in no acute distress.   HEENT: Normocephalic, atraumatic, pupils are equal, round and reactive to light and accommodation. Extraocular tracking shows mild saccadic breakdown without nystagmus noted. There is no limitation to her gaze. There is mild decrease in eye blink rate. Hearing is intact. Face is symmetric with mild facial masking and normal facial sensation. There is no lip, neck or jaw tremor. Neck is mildly rigid with intact passive ROM. There are no carotid bruits on auscultation. Oropharynx exam reveals mild mouth dryness. No significant airway crowding is noted. Mallampati is class II. Tongue protrudes centrally and palate elevates symmetrically.   There is no drooling.   Chest: is clear to auscultation without wheezing, rhonchi or crackles noted.  Heart: sounds are regular and normal without murmurs, rubs or gallops noted.   Abdomen: is soft, non-tender and non-distended with normal bowel sounds appreciated on auscultation.  Extremities: There is no pitting edema in the distal lower extremities bilaterally.   Skin: is warm and dry with no trophic changes noted.  Musculoskeletal: exam reveals no obvious joint deformities, tenderness or joint swelling or  erythema.  Neurologically:  Mental status: The patient is awake and alert, paying good  attention. She is able to completely provide the history. She is oriented to: person, place, situation, day of week, month of year and year. Her memory, attention, language and knowledge are intact. There is no aphasia, agnosia, apraxia or anomia. There is a no bradyphrenia. Speech is mildly hypophonic with no dysarthria noted. Mood is congruent and affect is normal.   Cranial nerves are as described above under HEENT exam. In addition, shoulder shrug is normal with equal shoulder height noted.  Motor exam: Normal bulk, and strength for age is noted. There are no dyskinesias noted. Tone is mildly rigid with absence of cogwheeling in the bilateral extremities. There is overall mild bradykinesia. There is no drift or rebound. There is no tremor. Romberg was not tested. She brought her new walker. Reflexes are 1+ in the upper extremities and 1+ in the lower extremities. Fine motor skills exam reveals: Finger taps are mildly impaired on the right and mildly impaired on the left. Hand movements are mildly impaired on the right and mildly impaired on the left. RAP (rapid alternating patting) is mildly impaired on the right and moderately impaired on the left. Foot taps are mildly impaired on the right and moderately impaired on the left. Foot agility (in the form of heel stomping) is mildly impaired on the right and mildly impaired on the left.    Cerebellar testing shows no dysmetria or intention tremor on finger to nose testing. Heel to shin is unremarkable bilaterally. There is no truncal or gait ataxia.   Sensory exam is intact to light touch.   Gait, station and balance exam: She stands up with mild difficulty and uses her 2 wheeled walker well. She turns in 3 steps.   Assessment and Plan:   Assessment and Plan:  In summary, YARIELA TISON is a very pleasant 77 y.o.-year  old female with a history of left-sided  predominant Parkinson's disease diagnosed in 2006 but with symptoms dating back to a few years prior to that. She has remained fairly stable and has recently had a right total hip replacement surgery and has recovered well from it. She has an appointment for FU tomorrow and was told recently that there was a bone spur, which was causing her pain, but this is better now. She c/o EDS, and I suggested changing mirapex IR 0.5 mg tid to ER 1.5 mg qHS. I provided her with a new prescription for this. She is advised to keep well hydrated and follow the instructions of her orthopedic provider as far as exercises go. I would like to reevaluate her in 6 months, sooner if the need arises. I encouraged her to call with any interim questions, concerns, problems or updates and refill requests.

## 2013-03-14 ENCOUNTER — Telehealth: Payer: Self-pay

## 2013-03-14 MED ORDER — PRAMIPEXOLE DIHYDROCHLORIDE 0.5 MG PO TABS: 0.5000 mg | ORAL_TABLET | Freq: Three times a day (TID) | ORAL | Status: DC

## 2013-03-14 NOTE — Telephone Encounter (Signed)
[facility] sent [place] a fax saying Mirapex ER is not covered under the patient's plan.  She is unable to afford the medication.  Could it be changed to something else?  Please advise.

## 2013-03-14 NOTE — Telephone Encounter (Signed)
I called the patient back.  Got no answer.  Left message. 

## 2013-03-14 NOTE — Telephone Encounter (Signed)
Rx was updated and sent to the pharmacy.

## 2013-03-14 NOTE — Telephone Encounter (Signed)
Can you please change her back to Mirapex regular release 0.5 mg 3 times a day? I had changed that to long-acting once daily because she was complaining of excessive daytime somnolence when I recently saw her. Please explain to patient that we will just revert back to what she was taking before.

## 2013-03-18 ENCOUNTER — Other Ambulatory Visit (INDEPENDENT_AMBULATORY_CARE_PROVIDER_SITE_OTHER): Payer: Medicare Other

## 2013-03-18 ENCOUNTER — Ambulatory Visit (INDEPENDENT_AMBULATORY_CARE_PROVIDER_SITE_OTHER): Payer: Medicare Other | Admitting: Internal Medicine

## 2013-03-18 ENCOUNTER — Encounter: Payer: Self-pay | Admitting: Internal Medicine

## 2013-03-18 VITALS — BP 122/64 | HR 73 | Temp 97.8°F | Wt 150.0 lb

## 2013-03-18 DIAGNOSIS — R600 Localized edema: Secondary | ICD-10-CM

## 2013-03-18 DIAGNOSIS — I10 Essential (primary) hypertension: Secondary | ICD-10-CM

## 2013-03-18 DIAGNOSIS — R609 Edema, unspecified: Secondary | ICD-10-CM

## 2013-03-18 DIAGNOSIS — IMO0001 Reserved for inherently not codable concepts without codable children: Secondary | ICD-10-CM

## 2013-03-18 DIAGNOSIS — G20 Parkinson's disease: Secondary | ICD-10-CM

## 2013-03-18 DIAGNOSIS — G20A1 Parkinson's disease without dyskinesia, without mention of fluctuations: Secondary | ICD-10-CM

## 2013-03-18 LAB — CBC WITH DIFFERENTIAL/PLATELET
Basophils Absolute: 0 10*3/uL (ref 0.0–0.1)
Eosinophils Absolute: 0.1 10*3/uL (ref 0.0–0.7)
HCT: 33.8 % — ABNORMAL LOW (ref 36.0–46.0)
Lymphs Abs: 2.4 10*3/uL (ref 0.7–4.0)
MCV: 88.9 fl (ref 78.0–100.0)
Monocytes Absolute: 0.8 10*3/uL (ref 0.1–1.0)
Platelets: 158 10*3/uL (ref 150.0–400.0)
RDW: 15.6 % — ABNORMAL HIGH (ref 11.5–14.6)

## 2013-03-18 LAB — BASIC METABOLIC PANEL
BUN: 27 mg/dL — ABNORMAL HIGH (ref 6–23)
CO2: 30 mEq/L (ref 19–32)
Chloride: 101 mEq/L (ref 96–112)
Creatinine, Ser: 1 mg/dL (ref 0.4–1.2)

## 2013-03-18 NOTE — Patient Instructions (Signed)
It was good to see you today.  We have reviewed your prior records including labs and tests today  Test(s) ordered today. Your results will be released to MyChart (or called to you) after review, usually within 72hours after test completion. If any changes need to be made, you will be notified at that same time.  Medications reviewed and updated, no changes recommended at this time.  Please schedule followup in 6 months, call sooner if problems.  Edema Edema is an abnormal build-up of fluids in tissues. Because this is partly dependent on gravity (water flows to the lowest place), it is more common in the legs and thighs (lower extremities). It is also common in the looser tissues, like around the eyes. Painless swelling of the feet and ankles is common and increases as a person ages. It may affect both legs and may include the calves or even thighs. When squeezed, the fluid may move out of the affected area and may leave a dent for a few moments. CAUSES   Prolonged standing or sitting in one place for extended periods of time. Movement helps pump tissue fluid into the veins, and absence of movement prevents this, resulting in edema.  Varicose veins. The valves in the veins do not work as well as they should. This causes fluid to leak into the tissues.  Fluid and salt overload.  Injury, burn, or surgery to the leg, ankle, or foot, may damage veins and allow fluid to leak out.  Sunburn damages vessels. Leaky vessels allow fluid to go out into the sunburned tissues.  Allergies (from insect bites or stings, medications or chemicals) cause swelling by allowing vessels to become leaky.  Protein in the blood helps keep fluid in your vessels. Low protein, as in malnutrition, allows fluid to leak out.  Hormonal changes, including pregnancy and menstruation, cause fluid retention. This fluid may leak out of vessels and cause edema.  Medications that cause fluid retention. Examples are sex  hormones, blood pressure medications, steroid treatment, or anti-depressants.  Some illnesses cause edema, especially heart failure, kidney disease, or liver disease.  Surgery that cuts veins or lymph nodes, such as surgery done for the heart or for breast cancer, may result in edema. DIAGNOSIS  Your caregiver is usually easily able to determine what is causing your swelling (edema) by simply asking what is wrong (getting a history) and examining you (doing a physical). Sometimes x-rays, EKG (electrocardiogram or heart tracing), and blood work may be done to evaluate for underlying medical illness. TREATMENT  General treatment includes:  Leg elevation (or elevation of the affected body part).  Restriction of fluid intake.  Prevention of fluid overload.  Compression of the affected body part. Compression with elastic bandages or support stockings squeezes the tissues, preventing fluid from entering and forcing it back into the blood vessels.  Diuretics (also called water pills or fluid pills) pull fluid out of your body in the form of increased urination. These are effective in reducing the swelling, but can have side effects and must be used only under your caregiver's supervision. Diuretics are appropriate only for some types of edema. The specific treatment can be directed at any underlying causes discovered. Heart, liver, or kidney disease should be treated appropriately. HOME CARE INSTRUCTIONS   Elevate the legs (or affected body part) above the level of the heart, while lying down.  Avoid sitting or standing still for prolonged periods of time.  Avoid putting anything directly under the knees when lying   down, and do not wear constricting clothing or garters on the upper legs.  Exercising the legs causes the fluid to work back into the veins and lymphatic channels. This may help the swelling go down.  The pressure applied by elastic bandages or support stockings can help reduce  ankle swelling.  A low-salt diet may help reduce fluid retention and decrease the ankle swelling.  Take any medications exactly as prescribed. SEEK MEDICAL CARE IF:  Your edema is not responding to recommended treatments. SEEK IMMEDIATE MEDICAL CARE IF:   You develop shortness of breath or chest pain.  You cannot breathe when you lay down; or if, while lying down, you have to get up and go to the window to get your breath.  You are having increasing swelling without relief from treatment.  You develop a fever over 102 F (38.9 C).  You develop pain or redness in the areas that are swollen.  Tell your caregiver right away if you have gained 3 lb/1.4 kg in 1 day or 5 lb/2.3 kg in a week. MAKE SURE YOU:   Understand these instructions.  Will watch your condition.  Will get help right away if you are not doing well or get worse. Document Released: 07/21/2005 Document Revised: 01/20/2012 Document Reviewed: 03/08/2008 [employer] Patient Information 2013 [employer].  

## 2013-03-18 NOTE — Assessment & Plan Note (Signed)
New onset 01/2012 - mild -mod in BLEs only - started HCTZ for same Increasing symptoms since 10/2012 Changed to loop diuretic 12/2012 and stopped hctz check labs now Reviewed 2d Echo 02/2012: grade 1 diast CHF, normal LVEF; unchanged 12/2012 Echo

## 2013-03-18 NOTE — Assessment & Plan Note (Signed)
Follows with neuro for same - predominately affects LUE/hand The current medical regimen is effective;  continue present plan and medications.

## 2013-03-18 NOTE — Progress Notes (Signed)
  Subjective:    Patient ID: Dawn Ashley, female    DOB: [birth year], 77 y.o.   MRN: [id number]  HPI  Here for follow up - reviewed chronic medical issues and interval medical history  Past Medical History  Diagnosis Date  . Mitral valve prolapse   . Menopause   . Osteopenia     sacral insuff fx on MRI L spine 01/2011  . Parkinson disease 01/2005 dx  . Back pain   . Displacement of lumbar intervertebral disc without myelopathy   . Kidney stones   . Osteoporosis with fracture   . Hypertension   . Heart murmur     h/o MVP, pt. reports murmur is almost resolved  . Arthritis     osteoporosis, osteoarthritis   . Neuromuscular disorder     parkinson's disease, followed by Dr. Sandria Manly    Review of Systems  Constitutional: Negative for fever and fatigue.  Respiratory: Negative for cough and shortness of breath.   Cardiovascular: Positive for leg swelling. Negative for chest pain and palpitations.       Objective:   Physical Exam  BP 122/64  Pulse 73  Temp(Src) 97.8 F (36.6 C) (Oral)  Wt 150 lb (68.04 kg)  BMI 28.1 kg/m2  SpO2 97% Wt Readings from Last 3 Encounters:  03/18/13 150 lb (68.04 kg)  03/08/13 150 lb (68.04 kg)  12/06/12 150 lb (68.04 kg)   Constitutional: She appears well-developed and well-nourished. No distress.  using RW Neck: Normal range of motion. Neck supple. No JVD present. No thyromegaly present.  Cardiovascular: Normal rate, regular rhythm and normal heart sounds.  No murmur heard. 1+ pitting BLE edema knee down, slight L>R (wearing knee high compression socks). Pulmonary/Chest: Effort normal and breath sounds normal. No respiratory distress. She has no wheezes. Neurological: She is alert and oriented to person, place, and time. No cranial nerve deficit. Coordination and speech are normal.  Skin: Skin is warm and dry. No rash noted. No erythema.  Psychiatric: She has a normal mood and affect. Her behavior is normal. Judgment and thought content normal.    Lab Results  Component Value Date   WBC 11.3* 09/23/2012   HGB 7.9* 09/23/2012   HCT 24.0* 09/23/2012   PLT 201 09/23/2012   GLUCOSE 102* 12/03/2012   CHOL 192 02/10/2012   TRIG 29.0 02/10/2012   HDL 80.50 02/10/2012   LDLCALC 106* 02/10/2012   ALT 22 12/03/2012   AST 31 12/03/2012   NA 138 12/03/2012   K 3.7 12/03/2012   CL 105 12/03/2012   CREATININE 1.0 12/03/2012   BUN 30* 12/03/2012   CO2 29 12/03/2012   TSH 2.74 01/21/2012   INR 0.97 09/15/2012       Assessment & Plan:   See problem list. Medications and labs reviewed today.

## 2013-03-18 NOTE — Assessment & Plan Note (Signed)
  BP Readings from Last 3 Encounters:  03/18/13 122/64  03/08/13 140/69  12/06/12 160/70   The current medical regimen is effective;  continue present plan and medications.

## 2013-03-21 ENCOUNTER — Ambulatory Visit: Payer: Medicare Other

## 2013-03-30 ENCOUNTER — Ambulatory Visit: Payer: Medicare Other

## 2013-03-31 ENCOUNTER — Telehealth: Payer: Self-pay | Admitting: *Deleted

## 2013-03-31 MED ORDER — POTASSIUM CHLORIDE ER 10 MEQ PO TBCR: 10.0000 meq | EXTENDED_RELEASE_TABLET | Freq: Every day | ORAL | Status: DC

## 2013-03-31 NOTE — Telephone Encounter (Signed)
A user error has taken place.

## 2013-04-01 ENCOUNTER — Other Ambulatory Visit: Payer: Self-pay | Admitting: *Deleted

## 2013-04-01 MED ORDER — POTASSIUM CHLORIDE ER 10 MEQ PO TBCR: 10.0000 meq | EXTENDED_RELEASE_TABLET | Freq: Every day | ORAL | Status: DC

## 2013-04-06 ENCOUNTER — Other Ambulatory Visit: Payer: Self-pay | Admitting: *Deleted

## 2013-04-06 ENCOUNTER — Ambulatory Visit: Payer: Medicare Other

## 2013-04-06 ENCOUNTER — Other Ambulatory Visit: Payer: Self-pay | Admitting: Internal Medicine

## 2013-04-06 MED ORDER — POTASSIUM CHLORIDE ER 10 MEQ PO TBCR: 10.0000 meq | EXTENDED_RELEASE_TABLET | Freq: Every day | ORAL | Status: DC

## 2013-04-25 ENCOUNTER — Ambulatory Visit: Payer: Medicare Other

## 2013-05-02 ENCOUNTER — Encounter: Payer: Self-pay | Admitting: Internal Medicine

## 2013-05-11 ENCOUNTER — Other Ambulatory Visit: Payer: Self-pay | Admitting: Internal Medicine

## 2013-05-17 ENCOUNTER — Ambulatory Visit
Admission: RE | Admit: 2013-05-17 | Discharge: 2013-05-17 | Disposition: A | Discharge status: Home / Self Care | Payer: Medicare Other | Source: Ambulatory Visit

## 2013-05-17 DIAGNOSIS — Z1231 Encounter for screening mammogram for malignant neoplasm of breast: Secondary | ICD-10-CM

## 2013-06-20 ENCOUNTER — Other Ambulatory Visit: Payer: Self-pay | Admitting: Neurology

## 2013-07-12 NOTE — Progress Notes (Signed)
This encounter was created in error - please disregard.

## 2013-07-22 ENCOUNTER — Other Ambulatory Visit: Payer: Self-pay | Admitting: Sports Medicine

## 2013-07-22 MED ORDER — GABAPENTIN 300 MG PO CAPS: 300.0000 mg | ORAL_CAPSULE | Freq: Three times a day (TID) | ORAL | Status: DC

## 2013-08-15 ENCOUNTER — Encounter: Payer: Self-pay | Admitting: Internal Medicine

## 2013-08-15 ENCOUNTER — Ambulatory Visit (INDEPENDENT_AMBULATORY_CARE_PROVIDER_SITE_OTHER): Payer: Medicare Other | Admitting: Internal Medicine

## 2013-08-15 ENCOUNTER — Other Ambulatory Visit (INDEPENDENT_AMBULATORY_CARE_PROVIDER_SITE_OTHER): Payer: Medicare Other

## 2013-08-15 VITALS — BP 128/70 | HR 81 | Temp 97.0°F | Ht 61.25 in | Wt 154.8 lb

## 2013-08-15 DIAGNOSIS — I10 Essential (primary) hypertension: Secondary | ICD-10-CM

## 2013-08-15 DIAGNOSIS — G20 Parkinson's disease: Secondary | ICD-10-CM

## 2013-08-15 DIAGNOSIS — IMO0001 Reserved for inherently not codable concepts without codable children: Secondary | ICD-10-CM

## 2013-08-15 DIAGNOSIS — IMO0002 Reserved for concepts with insufficient information to code with codable children: Secondary | ICD-10-CM

## 2013-08-15 DIAGNOSIS — R609 Edema, unspecified: Secondary | ICD-10-CM

## 2013-08-15 DIAGNOSIS — M8080XS Other osteoporosis with current pathological fracture, unspecified site, sequela: Secondary | ICD-10-CM

## 2013-08-15 LAB — BASIC METABOLIC PANEL
BUN: 34 mg/dL — ABNORMAL HIGH (ref 6–23)
CHLORIDE: 103 meq/L (ref 96–112)
CO2: 29 meq/L (ref 19–32)
CREATININE: 1 mg/dL (ref 0.4–1.2)
Calcium: 9.9 mg/dL (ref 8.4–10.5)
GFR: 56.05 mL/min — ABNORMAL LOW (ref >60.00)
Glucose, Bld: 87 mg/dL (ref 70–99)
Potassium: 4.4 mEq/L (ref 3.5–5.1)
Sodium: 140 mEq/L (ref 135–145)

## 2013-08-15 LAB — LIPID PANEL
CHOLESTEROL: 206 mg/dL — AB (ref 0–200)
HDL: 81.8 mg/dL (ref >39.00)
Total CHOL/HDL Ratio: 3
Triglycerides: 33 mg/dL (ref 0.0–149.0)
VLDL: 6.6 mg/dL (ref 0.0–40.0)

## 2013-08-15 LAB — LDL CHOLESTEROL, DIRECT: Direct LDL: 106.4 mg/dL

## 2013-08-15 NOTE — Assessment & Plan Note (Addendum)
Follows with neuro for same (previously [facility], now [facility], [employer]) - predominately affects LUE/hand.   The current medical regimen is effective;  continue present plan and medications.  She is concerned that current medications are causing excessive drowsiness.  Interval hx with neuro reviewed - defer medications to their specialty as ongoing Advised to follow up as scheduled with neuro 09/2013

## 2013-08-15 NOTE — Progress Notes (Signed)
Pre-visit discussion using our clinic review tool. No additional management support is needed unless otherwise documented below in the visit note.  

## 2013-08-15 NOTE — Assessment & Plan Note (Addendum)
On Lasix for peripheral edema Check lipids annually BP Readings from Last 3 Encounters:  08/15/13 128/70  03/18/13 122/64  03/08/13 140/69   The current medical regimen is effective;  continue present plan and medications.

## 2013-08-15 NOTE — Assessment & Plan Note (Signed)
New onset 01/2012 - mild -mod in BLEs only - started HCTZ for same Increasing symptoms since 10/2012 Changed to loop diuretic 12/2012 and stopped hctz - reports compliance, no adverse reactions Reviewed 2d Echo 02/2012: grade 1 diast CHF, normal LVEF; unchanged 12/2012 Echo No shortness of breath or CV symptoms

## 2013-08-15 NOTE — Patient Instructions (Signed)
It was good to see you today.  We have reviewed your prior records including labs and tests today  Test(s) ordered today. Your results will be released to [place] (or called to you) after review, usually within 72hours after test completion. If any changes need to be made, you will be notified at that same time.  Medications reviewed and updated, no changes recommended at this time.  Please schedule followup in 6 months, call sooner if problems.  Edema Edema is an abnormal build-up of fluids in tissues. Because this is partly dependent on gravity (water flows to the lowest place), it is more common in the legs and thighs (lower extremities). It is also common in the looser tissues, like around the eyes. Painless swelling of the feet and ankles is common and increases as a person ages. It may affect both legs and may include the calves or even thighs. When squeezed, the fluid may move out of the affected area and may leave a dent for a few moments. CAUSES   Prolonged standing or sitting in one place for extended periods of time. Movement helps pump tissue fluid into the veins, and absence of movement prevents this, resulting in edema.  Varicose veins. The valves in the veins do not work as well as they should. This causes fluid to leak into the tissues.  Fluid and salt overload.  Injury, burn, or surgery to the leg, ankle, or foot, may damage veins and allow fluid to leak out.  Sunburn damages vessels. Leaky vessels allow fluid to go out into the sunburned tissues.  Allergies (from insect bites or stings, medications or chemicals) cause swelling by allowing vessels to become leaky.  Protein in the blood helps keep fluid in your vessels. Low protein, as in malnutrition, allows fluid to leak out.  Hormonal changes, including pregnancy and menstruation, cause fluid retention. This fluid may leak out of vessels and cause edema.  Medications that cause fluid retention. Examples are sex  hormones, blood pressure medications, steroid treatment, or anti-depressants.  Some illnesses cause edema, especially heart failure, kidney disease, or liver disease.  Surgery that cuts veins or lymph nodes, such as surgery done for the heart or for breast cancer, may result in edema. DIAGNOSIS  Your caregiver is usually easily able to determine what is causing your swelling (edema) by simply asking what is wrong (getting a history) and examining you (doing a physical). Sometimes x-rays, EKG (electrocardiogram or heart tracing), and blood work may be done to evaluate for underlying medical illness. TREATMENT  General treatment includes:  Leg elevation (or elevation of the affected body part).  Restriction of fluid intake.  Prevention of fluid overload.  Compression of the affected body part. Compression with elastic bandages or support stockings squeezes the tissues, preventing fluid from entering and forcing it back into the blood vessels.  Diuretics (also called water pills or fluid pills) pull fluid out of your body in the form of increased urination. These are effective in reducing the swelling, but can have side effects and must be used only under your caregiver's supervision. Diuretics are appropriate only for some types of edema. The specific treatment can be directed at any underlying causes discovered. Heart, liver, or kidney disease should be treated appropriately. HOME CARE INSTRUCTIONS   Elevate the legs (or affected body part) above the level of the heart, while lying down.  Avoid sitting or standing still for prolonged periods of time.  Avoid putting anything directly under the knees when lying  down, and do not wear constricting clothing or garters on the upper legs.  Exercising the legs causes the fluid to work back into the veins and lymphatic channels. This may help the swelling go down.  The pressure applied by elastic bandages or support stockings can help reduce  ankle swelling.  A low-salt diet may help reduce fluid retention and decrease the ankle swelling.  Take any medications exactly as prescribed. SEEK MEDICAL CARE IF:  Your edema is not responding to recommended treatments. SEEK IMMEDIATE MEDICAL CARE IF:   You develop shortness of breath or chest pain.  You cannot breathe when you lay down; or if, while lying down, you have to get up and go to the window to get your breath.  You are having increasing swelling without relief from treatment.  You develop a fever over 102 F (38.9 C).  You develop pain or redness in the areas that are swollen.  Tell your caregiver right away if you have gained 3 lb/1.4 kg in 1 day or 5 lb/2.3 kg in a week. MAKE SURE YOU:   Understand these instructions.  Will watch your condition.  Will get help right away if you are not doing well or get worse. Document Released: 07/21/2005 Document Revised: 01/20/2012 Document Reviewed: 03/08/2008 [facility] Patient Information 2013 [place].

## 2013-08-15 NOTE — Progress Notes (Signed)
   Subjective:    Patient ID: Dawn Ashley, female    DOB: 06-08-1936, 78 y.o.   MRN: [id number]  HPI Patient here today for follow up.  Chronic medical issues reviewed.  Past Medical History  Diagnosis Date  . Mitral valve prolapse   . Menopause   . Osteopenia     sacral insuff fx on MRI L spine 01/2011  . Parkinson disease 01/2005 dx  . Back pain   . Displacement of lumbar intervertebral disc without myelopathy   . Kidney stones   . Osteoporosis with fracture   . Hypertension   . Heart murmur     h/o MVP, pt. reports murmur is almost resolved  . Arthritis     osteoporosis, osteoarthritis   . Neuromuscular disorder     parkinson's disease, followed by Dr. Erling Cruz     Review of Systems  Constitutional: Negative for fever, chills, activity change and appetite change.  HENT: Negative for congestion, rhinorrhea and sinus pressure.   Respiratory: Negative for cough, chest tightness, shortness of breath and wheezing.   Cardiovascular: Positive for leg swelling (chronic). Negative for chest pain and palpitations.  Gastrointestinal: Negative for nausea, vomiting, abdominal pain, diarrhea, constipation and abdominal distention.  Endocrine: Negative for cold intolerance and heat intolerance.  Musculoskeletal: Positive for arthralgias.  Skin: Negative for rash.  Neurological: Negative for dizziness, syncope and headaches.  Psychiatric/Behavioral: Positive for sleep disturbance (falls asleep easily during the day).       Objective:   Physical Exam  Vitals reviewed. Constitutional: She is oriented to person, place, and time. She appears well-developed and well-nourished. No distress.  HENT:  Head: Normocephalic and atraumatic.  Eyes: Conjunctivae and EOM are normal. Pupils are equal, round, and reactive to light.  Neck: Normal range of motion. Neck supple. No thyromegaly present.  Cardiovascular: Normal rate, regular rhythm and normal heart sounds.   No murmur  heard. Pulmonary/Chest: Effort normal and breath sounds normal. No respiratory distress. She has no wheezes.  Abdominal: Soft. Bowel sounds are normal. She exhibits no distension. There is no tenderness.  Musculoskeletal: Normal range of motion. She exhibits edema (chonic without change, compression hose on). She exhibits no tenderness.  Neurological: She is alert and oriented to person, place, and time.  Skin: Skin is warm and dry. No rash noted. She is not diaphoretic.  Psychiatric: She has a normal mood and affect. Her behavior is normal. Judgment and thought content normal.     Wt Readings from Last 3 Encounters:  08/15/13 154 lb 12.8 oz (70.217 kg)  03/18/13 150 lb (68.04 kg)  03/08/13 150 lb (68.04 kg)   BP Readings from Last 3 Encounters:  08/15/13 128/70  03/18/13 122/64  03/08/13 140/69   Lab Results  Component Value Date   WBC 6.7 03/18/2013   HGB 11.1* 03/18/2013   HCT 33.8* 03/18/2013   PLT 158.0 03/18/2013   GLUCOSE 86 03/18/2013   CHOL 192 02/10/2012   TRIG 29.0 02/10/2012   HDL 80.50 02/10/2012   LDLCALC 106* 02/10/2012   ALT 22 12/03/2012   AST 31 12/03/2012   NA 137 03/18/2013   K 3.8 03/18/2013   CL 101 03/18/2013   CREATININE 1.0 03/18/2013   BUN 27* 03/18/2013   CO2 30 03/18/2013   TSH 0.87 03/18/2013   INR 0.97 09/15/2012        Assessment & Plan:   See problem list

## 2013-08-15 NOTE — Assessment & Plan Note (Addendum)
DEXA 12/2012 -1.4 MRI L spine 01/2011 with incidental sacral insuff fracture; also R hip fx 08/2012 with accidental fall takes Ca + vit D - started fosamax 07/2012 - reports compliance Continue WB exercise  - refer to PT to help with same

## 2013-08-18 ENCOUNTER — Other Ambulatory Visit: Payer: Self-pay | Admitting: Sports Medicine

## 2013-09-01 ENCOUNTER — Other Ambulatory Visit: Payer: Self-pay | Admitting: Neurology

## 2013-09-05 ENCOUNTER — Telehealth: Payer: Self-pay | Admitting: *Deleted

## 2013-09-05 MED ORDER — GABAPENTIN 300 MG PO CAPS: 300.0000 mg | ORAL_CAPSULE | Freq: Every day | ORAL | Status: DC

## 2013-09-05 NOTE — Telephone Encounter (Signed)
Ok to stop if side effects intolerable  If needed for pain, ok to use only at bedtime to control pain and minimize side effects (opposed to TID as ordered) thanks

## 2013-09-05 NOTE — Telephone Encounter (Signed)
Notified patient of MD's instructions.  She verbalized understanding. 

## 2013-09-05 NOTE — Telephone Encounter (Signed)
Pt phoned c/o blurred vision, dry mouth, and drowsiness with the "newly prescribed" neurontin.  Wants to know if it's okay if it can be stopped.  Please advise.    CB# [phone]

## 2013-09-09 ENCOUNTER — Ambulatory Visit: Payer: Medicare Other | Admitting: Neurology

## 2013-09-19 ENCOUNTER — Encounter: Payer: Self-pay | Admitting: Neurology

## 2013-09-19 ENCOUNTER — Ambulatory Visit (INDEPENDENT_AMBULATORY_CARE_PROVIDER_SITE_OTHER): Payer: Medicare Other | Admitting: Neurology

## 2013-09-19 VITALS — BP 166/75 | HR 73 | Temp 97.2°F | Ht 61.25 in | Wt 154.0 lb

## 2013-09-19 DIAGNOSIS — G20 Parkinson's disease: Secondary | ICD-10-CM

## 2013-09-19 DIAGNOSIS — M549 Dorsalgia, unspecified: Secondary | ICD-10-CM

## 2013-09-19 DIAGNOSIS — R609 Edema, unspecified: Secondary | ICD-10-CM

## 2013-09-19 DIAGNOSIS — R600 Localized edema: Secondary | ICD-10-CM

## 2013-09-19 DIAGNOSIS — Z96649 Presence of unspecified artificial hip joint: Secondary | ICD-10-CM

## 2013-09-19 DIAGNOSIS — M25559 Pain in unspecified hip: Secondary | ICD-10-CM

## 2013-09-19 DIAGNOSIS — R269 Unspecified abnormalities of gait and mobility: Secondary | ICD-10-CM

## 2013-09-19 DIAGNOSIS — G20A1 Parkinson's disease without dyskinesia, without mention of fluctuations: Secondary | ICD-10-CM

## 2013-09-19 DIAGNOSIS — M25551 Pain in right hip: Secondary | ICD-10-CM

## 2013-09-19 MED ORDER — PRAMIPEXOLE DIHYDROCHLORIDE 0.5 MG PO TABS: ORAL_TABLET | ORAL | Status: DC

## 2013-09-19 MED ORDER — RASAGILINE MESYLATE 1 MG PO TABS: 1.0000 mg | ORAL_TABLET | Freq: Every day | ORAL | Status: DC

## 2013-09-19 NOTE — Progress Notes (Signed)
Subjective:    Patient ID: Dawn Ashley is a 78 y.o. female.  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 The patient is unaccompanied today. I last saw her on 03/08/2013, at which time I felt she was quite stable and that she had recovered well from her recent total hip replacement surgery. She was complaining of daytime somnolence and I suggested changing her Mirapex immediate release to long-acting. However, she called back a few days later reporting that the long-acting Mirapex was not covered by her insurance and we changed her back to immediate release Mirapex.   Today, she reports having reduced her gabapentin to 300 mg at night only for the past 2 1/2 weeks. She was started on Gabapentin in 11/13, by her sports medicine doctor and was up to 300 mg tid, but has had sleepiness and blurry vision with it. She is scheduled for a back injection on 10/06/13 under Dr. Mina Marble. Dr. Mayer Camel is her orthopedic doctor. She has started PT at [facility], 3 times a week. She feels stable with her PD Sx, and has never been on C/L. She has had some back pain, sometimes on the R, sometimes on the L. She has noted some pain in her R leg, in the gluteal region and her hamstring area, she wonders, if she pulled a muscle while doing PT.   I first met her on 12/06/12 at which time I did not change her medications. She previously followed with Dr. Morene Antu and was last seen by him on 09/08/2012, which time he felt that her sleepiness was in part d/t tramadol and stopped it. He also obtained blood work including CBC,  CMP, TSH and urinalysis as well as B12 level and ordered a brain MRI. She had problems with her gait which he primarily attributed to right hip pain.  Her blood work showed mild elevated alkaline phosphatase and BUN of 28 and creatinine of 1.17. Her son was called back and advised that she should increase her fluid intake. Her urinalysis was not consistent with clear-cut UTI. Since her last visit with Dr. Erling Cruz she had right hip replacement surgery on 09/20/12. She has been in rehabilitation. She did not end up getting her brain scan because she had surgery soon after her last visit and was in rehabilitation since then. Her sleepiness and confusion improved after her pain medication was changed from tramadol to diclofenac. She moved to [place]. She had developed some LE swelling and was switched from HCTZ to Lasix and advised to reduce her NSAIDs.   Her Past Medical History Is Significant For: Past Medical History  Diagnosis Date  . Mitral valve prolapse   . Menopause   . Osteopenia     sacral insuff fx on MRI L spine 01/2011  . Parkinson disease 01/2005 dx  . Back pain   . Displacement of lumbar intervertebral disc without myelopathy   . Kidney stones   . Osteoporosis with fracture   . Hypertension   . Heart murmur     h/o MVP, pt. reports murmur is almost resolved  .  Arthritis     osteoporosis, osteoarthritis   . Neuromuscular disorder     parkinson's disease, followed by Dr. Erling Cruz    Her Past Surgical History Is Significant For: Past Surgical History  Procedure Laterality Date  . Tonsillectomy    . Cataract extraction w/ intraocular lens  implant, bilateral  2011  2004  . Dilation and curettage of uterus  2012  . Back surgery  2009    spurs resting on nerve, lumbar  . Total hip arthroplasty Right 09/20/2012    Procedure: TOTAL HIP ARTHROPLASTY;  Surgeon: Kerin Salen, MD;  Location: [place];  Service: Orthopedics;  Laterality: Right;  . Orif pelvic fracture  Right 09/20/2012    Procedure: OPEN REDUCTION INTERNAL FIXATION (ORIF) PELVIC FRACTURE;  Surgeon: Kerin Salen, MD;  Location: [place];  Service: Orthopedics;  Laterality: Right;    Her Family History Is Significant For: Family History  Problem Relation Age of Onset  . Breast cancer Maternal Grandmother   . Arthritis Maternal Grandmother   . Hypertension Other   . Osteoporosis Other   . Breast cancer Mother     had mastectomy in 1982  . Heart disease Mother   . Stroke Mother     mild, 2001  . Hypertension Mother   . Osteoporosis Mother     also had spine surgery 1987, & hip repalcement  . Arthritis Sister   . Hypertension Sister   . Parkinsonism Maternal Uncle   . Arthritis Maternal Grandfather     Her Social History Is Significant For: History   Social History  . Marital Status: Widowed    Spouse Name: N/A    Number of Children: N/A  . Years of Education: N/A   Social History Main Topics  . Smoking status: Never Smoker   . Smokeless tobacco: Never Used  . Alcohol Use: 1.2 oz/week    2 Glasses of wine per week     Comment: glass of wine, occas.  . Drug Use: No  . Sexual Activity: None   Other Topics Concern  . None   Social History Narrative  . None    Her Allergies Are:  Allergies  Allergen Reactions  . Tramadol Other (See Comments)    Confusion and sleepy  :   Her Current Medications Are:  Outpatient Encounter Prescriptions as of 09/19/2013  Medication Sig  . alendronate (FOSAMAX) 70 MG tablet Take 1 tablet (70 mg total) by mouth every Saturday at 6 PM. Take with a full glass of water on an empty stomach.  Marland Kitchen aspirin 81 MG EC tablet Take 1 tablet (81 mg total) by mouth daily.  . Calcium Carbonate-Vitamin D (CALCIUM + D PO) Take 1 tablet by mouth 2 (two) times daily.  . Cholecalciferol (VITAMIN D) 2000 UNITS tablet Take 4,000 Units by mouth daily.  Marland Kitchen conjugated estrogens (PREMARIN) vaginal cream Place 1 g vaginally 2 (two) times a week. Every Monday and  Thursday.  . furosemide (LASIX) 40 MG tablet Take 1 tablet (40 mg total) by mouth daily.  Marland Kitchen gabapentin (NEURONTIN) 300 MG capsule Take 1 capsule (300 mg total) by mouth at bedtime.  Marland Kitchen KLOR-CON M10 10 MEQ tablet Take 1 tablet by mouth daily.  . potassium chloride (K-DUR) 10 MEQ tablet Take 1 tablet (10 mEq total) by mouth daily.  . pramipexole (MIRAPEX) 0.5 MG tablet TAKE 1 TABLET THREE TIMES A DAY  . rasagiline (AZILECT) 1 MG TABS tablet Take 1 tablet (1 mg total) by mouth daily.  :  Review of Systems:  Out of a complete 14 point review of systems, all are reviewed and negative with the exception of these symptoms as listed below:   Review of Systems  Constitutional: Negative.   HENT: Negative.   Eyes: Negative.   Respiratory: Negative.   Cardiovascular: Negative.   Gastrointestinal: Negative.   Endocrine: Positive for polydipsia.  Genitourinary: Positive for frequency.  Musculoskeletal: Positive for back pain.  Skin: Negative.   Allergic/Immunologic: Negative.   Neurological: Negative.   Hematological: Negative.   Psychiatric/Behavioral: Negative.     Objective:  Neurologic Exam  Physical Exam Physical Examination:   Filed Vitals:   09/19/13 1141  BP: 166/75  Pulse: 73  Temp: 97.2 F (36.2 C)    General Examination: The patient is a very pleasant 78 y.o. female in no acute distress.  HEENT: Normocephalic, atraumatic, pupils are equal, round and reactive to light and accommodation. Extraocular tracking shows mild saccadic breakdown without nystagmus noted. Funduscopy is normal. There is no limitation to her gaze. There is mild decrease in eye blink rate. Hearing is intact. Face is symmetric with mild facial masking and normal facial sensation. There is no lip, neck or jaw tremor. Neck is mildly rigid with intact passive ROM. There are no carotid bruits on auscultation. Oropharynx exam reveals mild mouth dryness. No significant airway crowding is noted. Mallampati is class  II. Tongue protrudes centrally and palate elevates symmetrically. There is no drooling.   Chest: is clear to auscultation without wheezing, rhonchi or crackles noted.  Heart: sounds are regular and normal without murmurs, rubs or gallops noted.   Abdomen: is soft, non-tender and non-distended with normal bowel sounds appreciated on auscultation.  Extremities: There is trace edema in the distal lower extremities bilaterally, but she is wearing compression stockings up to the knees b/l.   Skin: is warm and dry with no trophic changes noted.  Musculoskeletal: exam reveals: R hip pain and she leans to the left, sitting and standing. Knees are mildly swollen. No other joint deformities, tenderness or joint swelling or erythema is noted. Her R sided pain seems to come from the hamstring and gluteal area. She wonders, if she pulled a muscle while doing PT.   Neurologically:  Mental status: The patient is awake and alert, paying good  attention. She is able to completely provide the history. She is oriented to: person, place, situation, day of week, month of year and year. Her memory, attention, language and knowledge are intact. There is no aphasia, agnosia, apraxia or anomia. There is a no bradyphrenia. Speech is mildly hypophonic with no dysarthria noted. Mood is congruent and affect is normal.   Cranial nerves are as described above under HEENT exam. In addition, shoulder shrug is normal with equal shoulder height noted.  Motor exam: Normal bulk, and strength for age is noted. There are no dyskinesias noted. Tone is mildly rigid with absence of cogwheeling in the bilateral extremities. There is overall mild bradykinesia. There is no drift or rebound. There is no tremor. Romberg is negative. She brought her 2 wheeled walker. Reflexes are 1+ in the upper extremities and 1+ in the lower extremities. Fine motor skills exam reveals: Finger taps are mildly impaired on the right and mildly impaired on the  left. Hand movements are mildly impaired on the right and mildly impaired on the left. RAP (rapid alternating patting) is mildly impaired on the right and moderately impaired on the left. Foot taps are mildly impaired on the right and  moderately impaired on the left. Foot agility (in the form of heel stomping) is mildly impaired on the right and mildly impaired on the left.    Cerebellar testing shows no dysmetria or intention tremor on finger to nose testing. Heel to shin is not checked. There is no truncal or gait ataxia.   Sensory exam is intact to light touch, PP, temperature sense and vibration sense in the UEs and LEs.   Gait, station and balance exam: She stands up with mild difficulty and has a moderately stooped posture and leans to the L. She uses her 2 wheeled walker well. She has a limp on the R. She turns in 3 steps.   Assessment and Plan:   In summary, Dawn Ashley is a very pleasant 78 year old female with a history of left-sided predominant Parkinson's disease diagnosed in 2006 but with symptoms dating back to a few years prior to that. She has remained fairly stable and while she has done well after her R total hip replacement surgery and has recovered well from it, she has noted some R sided pain in her leg, which may come from muscle irritation. She can try a heat pad for this. I suggested she stay on the same medication regimen and increasing her mirapex may result in worsening EDS and LE swelling. She is on 1 mg strength of Azilect at this time. Down the Road we may add levodopa to her regimen but I do not think we need to do this at this time. She was in agreement and is reluctant to add more medications. I renewed her prescription for Mirapex and Azilect. She is advised to keep well hydrated and follow the instructions of her orthopedic provider as far as exercises go. I would like to re-evaluate her in 6 months, sooner if the need arises. I encouraged her to call with any interim  questions, concerns, problems or updates and refill requests.

## 2013-09-19 NOTE — Patient Instructions (Addendum)
I think your Parkinson's disease has remained fairly stable, which is reassuring. Nevertheless, as you know, this disease does progress with time. It can affect your balance, your memory, your mood, your bowel and bladder function, your posture, balance and walking. Overall you are doing fairly well but I do want to suggest a few things today:  Remember to drink plenty of fluid, eat healthy meals and do not skip any meals. Try to eat protein with a every meal and eat a healthy snack such as fruit or nuts in between meals. Try to keep a regular sleep-wake schedule and try to exercise daily, particularly in the form of walking, 20-30 minutes a day, if you can. Use your walker at all times.   Taking your medication on schedule is key.   Try to stay active physically and mentally. Engage in social activities in your community and with your family and try to keep up with current events by reading the newspaper or watching the news. Try to do word puzzles and you may like to do word puzzles and brain games on the computer such as on [email or web address]   As far as your medications are concerned, I would like to suggest that you take your current medication with the following additional changes: no changes.     As far as diagnostic testing, I will order: no new test today.   I would like to see you back in 6 months, sooner if we need to. Please call us with any interim questions, concerns, problems, updates or refill requests.  Our nursing staff will answer any of your questions and relay your messages to me and also relay most of my messages to you.  Our phone number is [phone]. We also have an after hours call service for urgent matters and there is a physician on-call for urgent questions, that cannot wait till the next work day. For any emergencies you know to call 911 or go to the nearest emergency room.

## 2013-10-03 ENCOUNTER — Telehealth: Payer: Self-pay | Admitting: Neurology

## 2013-10-03 ENCOUNTER — Telehealth: Payer: Self-pay | Admitting: *Deleted

## 2013-10-03 NOTE — Telephone Encounter (Signed)
Patient phoned c/o red, burning feet & inability to beat weight on them & states if "two toes touch, it hurts & burns"...since PCP is out of office for the week & all providers in this office are full, I recommended patient contacting her neurologist.

## 2013-10-03 NOTE — Telephone Encounter (Signed)
Patient called stating that her feet are "burning up" and when she walks and her toes touch it is extremely painful.  She called her family physician who told her to call the neurologist as it sounds like a nerve problem. Please call to advise.

## 2013-10-03 NOTE — Telephone Encounter (Signed)
Spoke with patient and she has been having the redness and numbness in 2 toes, now she is having burning in the toes occasionally. She discussed at last office visit and has discussed with PcP(Leschber) and she suggested to contact her neurologist.

## 2013-10-03 NOTE — Telephone Encounter (Signed)
Unfortunately, I do not know what to make of this. Redness and pain can signify multiple issues. This does not necessarily mean a neurological issue is going on. If there is redness and pain this could mean arthritis flareup, a skin reaction, cellulitis, which is a skin infection, or a circulation problem as well. If she is noticing a new problem with her toes and obvious redness she may want to get it checked out at an urgent care place.

## 2013-10-04 NOTE — Telephone Encounter (Signed)
Patient was given the message and verbalized understanding.  Patient was advised to call the office with any other question, problems or concerns.

## 2013-11-14 ENCOUNTER — Other Ambulatory Visit: Payer: Self-pay | Admitting: Internal Medicine

## 2013-12-23 ENCOUNTER — Encounter: Payer: Self-pay | Admitting: Internal Medicine

## 2013-12-23 ENCOUNTER — Ambulatory Visit (INDEPENDENT_AMBULATORY_CARE_PROVIDER_SITE_OTHER): Payer: Medicare Other | Admitting: Internal Medicine

## 2013-12-23 VITALS — BP 120/62 | HR 89 | Temp 97.0°F | Wt 147.8 lb

## 2013-12-23 DIAGNOSIS — H6123 Impacted cerumen, bilateral: Secondary | ICD-10-CM

## 2013-12-23 DIAGNOSIS — H6120 Impacted cerumen, unspecified ear: Secondary | ICD-10-CM

## 2013-12-23 DIAGNOSIS — R609 Edema, unspecified: Secondary | ICD-10-CM

## 2013-12-23 DIAGNOSIS — G20 Parkinson's disease: Secondary | ICD-10-CM

## 2013-12-23 NOTE — Assessment & Plan Note (Signed)
New onset 01/2012 - mild -mod in BLEs -  Changed HCTZ to loop diuretic 12/2012  reports compliance, no adverse reactions Reviewed 2d Echo 02/2012: grade 1 diast CHF, normal LVEF; unchanged 12/2012 Echo No shortness of breath or CV symptoms Use TED prn

## 2013-12-23 NOTE — Assessment & Plan Note (Signed)
Follows with neuro for same (previously [facility], now [facility], [facility]) - predominately affects LUE/hand.   Interval hx with neuro reviewed - defer medications to their specialty as ongoing Advised to follow up as scheduled with neuro and prn The current medical regimen is effective;  continue present plan and medications.

## 2013-12-23 NOTE — Progress Notes (Signed)
Subjective:    Patient ID: Dawn Ashley, female    DOB: 03-29-1936, 78 y.o.   MRN: [id number]  HPI  Patient is here for follow up  Reviewed chronic medical issues and interval medical events  Past Medical History  Diagnosis Date  . Mitral valve prolapse   . Menopause   . Osteopenia     sacral insuff fx on MRI L spine 01/2011  . Parkinson disease 01/2005 dx  . Back pain   . Displacement of lumbar intervertebral disc without myelopathy   . Kidney stones   . Osteoporosis with fracture   . Hypertension   . Heart murmur     h/o MVP, pt. reports murmur is almost resolved  . Arthritis     osteoporosis, osteoarthritis   . Neuromuscular disorder     parkinson's disease, followed by Dr. Erling Cruz    Review of Systems  Constitutional: Negative for fever and fatigue.  HENT: Positive for hearing loss. Negative for ear discharge and ear pain.   Respiratory: Negative for cough and shortness of breath.   Cardiovascular: Negative for chest pain and leg swelling.       Objective:   Physical Exam  BP 120/62  Pulse 89  Temp(Src) 97 F (36.1 C) (Oral)  Wt 147 lb 12.8 oz (67.042 kg)  SpO2 96% Wt Readings from Last 3 Encounters:  12/23/13 147 lb 12.8 oz (67.042 kg)  09/19/13 154 lb (69.854 kg)  08/15/13 154 lb 12.8 oz (70.217 kg)    Constitutional: She appears well-developed and well-nourished. No distress.  HENT: B cerumen impaction - after irrigation, TMs intact, clear without effusion Neck: Normal range of motion. Neck supple. No JVD present. No thyromegaly present.  Cardiovascular: Normal rate, regular rhythm and normal heart sounds.  No murmur heard. No BLE edema. Pulmonary/Chest: Effort normal and breath sounds normal. No respiratory distress. She has no wheezes.  Psychiatric: She has a normal mood and affect. Her behavior is normal. Judgment and thought content normal.   Lab Results  Component Value Date   WBC 6.7 03/18/2013   HGB 11.1* 03/18/2013   HCT 33.8* 03/18/2013   PLT 158.0 03/18/2013   GLUCOSE 87 08/15/2013   CHOL 206* 08/15/2013   TRIG 33.0 08/15/2013   HDL 81.80 08/15/2013   LDLDIRECT 106.4 08/15/2013   LDLCALC 106* 02/10/2012   ALT 22 12/03/2012   AST 31 12/03/2012   NA 140 08/15/2013   K 4.4 08/15/2013   CL 103 08/15/2013   CREATININE 1.0 08/15/2013   BUN 34* 08/15/2013   CO2 29 08/15/2013   TSH 0.87 03/18/2013   INR 0.97 09/15/2012    Mm Digital Screening  05/17/2013   CLINICAL DATA:  Screening.  EXAM: DIGITAL SCREENING BILATERAL MAMMOGRAM WITH CAD  COMPARISON:  Previous exam(s).  ACR Breast Density Category b: There are scattered areas of fibroglandular density.  FINDINGS: There are no findings suspicious for malignancy. Images were processed with CAD.  IMPRESSION: No mammographic evidence of malignancy. A result letter of this screening mammogram will be mailed directly to the patient.  RECOMMENDATION: Screening mammogram in one year. (Code:SM-B-01Y)  BI-RADS CATEGORY  1: Negative   Electronically Signed   By: Lovey Newcomer M.D.   On: 05/17/2013 16:46    Procedure: wax removal, bilateral Reason: wax impaction Risks and benefits of procedure discussed with the patient who agrees to proceed. Ear(s) irrigated with warm water. Large amount of wax removed. Instrumentation with metal ear loop was performed to accomplish wax removal.  the patient tolerated procedure well.     Assessment & Plan:   Cerumen impaction bilateral -irrigation successfully performed and removed as noted above today -patient to call if recurrent problem  Problem List Items Addressed This Visit   Parkinson disease     Follows with neuro for same (previously [facility], now [facility]) - predominately affects LUE/hand.   Interval hx with neuro reviewed - defer medications to their specialty as ongoing Advised to follow up as scheduled with neuro and prn The current medical regimen is effective;  continue present plan and medications.     Peripheral edema     New onset 01/2012 - mild -mod in BLEs  -  Changed HCTZ to loop diuretic 12/2012  reports compliance, no adverse reactions Reviewed 2d Echo 02/2012: grade 1 diast CHF, normal LVEF; unchanged 12/2012 Echo No shortness of breath or CV symptoms Use TED prn      Other Visit Diagnoses   Bilateral impacted cerumen    -  Primary

## 2013-12-23 NOTE — Patient Instructions (Addendum)
It was good to see you today.  We have reviewed your prior records including labs and tests today  Your ears have been irrigated of wax today -let us know if continued hearing problems persist for referral to audiologist and hearing testing  Medications reviewed and updated, no changes recommended at this time.  Please schedule followup in 6 months, call sooner if problems.  Okay to cancel July appointment at this time   Cerumen Impaction A cerumen impaction is when the wax in your ear forms a plug. This plug usually causes reduced hearing. Sometimes it also causes an earache or dizziness. Removing a cerumen impaction can be difficult and painful. The wax sticks to the ear canal. The canal is sensitive and bleeds easily. If you try to remove a heavy wax buildup with a cotton tipped swab, you may push it in further. Irrigation with water, suction, and small ear curettes may be used to clear out the wax. If the impaction is fixed to the skin in the ear canal, ear drops may be needed for a few days to loosen the wax. People who build up a lot of wax frequently can use ear wax removal products available in your local drugstore. SEEK MEDICAL CARE IF:  You develop an earache, increased hearing loss, or marked dizziness. Document Released: 08/28/2004 Document Revised: 10/13/2011 Document Reviewed: 10/18/2009 [facility] Patient Information 2014 [place], [place].

## 2013-12-23 NOTE — Progress Notes (Signed)
Pre visit review using our clinic review tool, if applicable. No additional management support is needed unless otherwise documented below in the visit note. 

## 2014-01-19 ENCOUNTER — Other Ambulatory Visit: Payer: Self-pay | Admitting: Internal Medicine

## 2014-02-08 ENCOUNTER — Other Ambulatory Visit: Payer: Self-pay | Admitting: Internal Medicine

## 2014-02-08 ENCOUNTER — Other Ambulatory Visit: Payer: Self-pay | Admitting: General Practice

## 2014-02-09 ENCOUNTER — Ambulatory Visit (INDEPENDENT_AMBULATORY_CARE_PROVIDER_SITE_OTHER): Payer: Medicare Other | Admitting: Internal Medicine

## 2014-02-09 ENCOUNTER — Encounter: Payer: Self-pay | Admitting: Internal Medicine

## 2014-02-09 VITALS — BP 136/80 | HR 79 | Temp 97.6°F | Ht 61.25 in | Wt 146.0 lb

## 2014-02-09 DIAGNOSIS — R1032 Left lower quadrant pain: Secondary | ICD-10-CM

## 2014-02-09 DIAGNOSIS — K219 Gastro-esophageal reflux disease without esophagitis: Secondary | ICD-10-CM

## 2014-02-09 MED ORDER — FAMOTIDINE 20 MG PO TABS: 20.0000 mg | ORAL_TABLET | Freq: Two times a day (BID) | ORAL | Status: DC

## 2014-02-09 NOTE — Assessment & Plan Note (Signed)
abd exam today benign Declines labs/imaging as done with uro this week and CT scan planned by uro for next week Treat with H2B pending other results from uro (ROI requested)

## 2014-02-09 NOTE — Progress Notes (Signed)
Subjective:    Patient ID: Dawn Ashley, female    DOB: Dec 27, 1935, 78 y.o.   MRN: [id number]  HPI  Patient is here for GERD symptoms   Also reviewed chronic medical issues and interval medical events  Past Medical History  Diagnosis Date  . Mitral valve prolapse   . Menopause   . Osteopenia     sacral insuff fx on MRI L spine 01/2011  . Parkinson disease 01/2005 dx  . Back pain   . Displacement of lumbar intervertebral disc without myelopathy   . Kidney stones   . Osteoporosis with fracture   . Hypertension   . Heart murmur     h/o MVP, pt. reports murmur is almost resolved  . Arthritis     osteoporosis, osteoarthritis     Review of Systems  Constitutional: Negative for fever and unexpected weight change.  Respiratory: Negative for cough and shortness of breath.   Cardiovascular: Negative for leg swelling.  Gastrointestinal: Positive for abdominal pain (intermittent LLQ, not related to BM or meals). Negative for nausea, vomiting, diarrhea, constipation, blood in stool and abdominal distention.       "acid reflux"? Unpleasant backwash taste in throat  Musculoskeletal: Positive for arthralgias (L hip  - working with ortho (rowan) on same). Negative for back pain and joint swelling.       Objective:   Physical Exam  BP 136/80  Pulse 79  Temp(Src) 97.6 F (36.4 C) (Oral)  Ht 5' 1.25" (1.556 m)  Wt 146 lb (66.225 kg)  BMI 27.35 kg/m2  SpO2 97% Wt Readings from Last 3 Encounters:  02/09/14 146 lb (66.225 kg)  12/23/13 147 lb 12.8 oz (67.042 kg)  09/19/13 154 lb (69.854 kg)   Constitutional: She appears well-developed and well-nourished. No distress.  Neck: Normal range of motion. Neck supple. No JVD present. No thyromegaly present.  Cardiovascular: Normal rate, regular rhythm and normal heart sounds.  No murmur heard. No BLE edema. Pulmonary/Chest: Effort normal and breath sounds normal. No respiratory distress. She has no wheezes. Abdomen: SNDNT, +BS - no  reproducible pain on exam  Psychiatric: She has a normal mood and affect. Her behavior is normal. Judgment and thought content normal.   Lab Results  Component Value Date   WBC 6.7 03/18/2013   HGB 11.1* 03/18/2013   HCT 33.8* 03/18/2013   PLT 158.0 03/18/2013   GLUCOSE 87 08/15/2013   CHOL 206* 08/15/2013   TRIG 33.0 08/15/2013   HDL 81.80 08/15/2013   LDLDIRECT 106.4 08/15/2013   LDLCALC 106* 02/10/2012   ALT 22 12/03/2012   AST 31 12/03/2012   NA 140 08/15/2013   K 4.4 08/15/2013   CL 103 08/15/2013   CREATININE 1.0 08/15/2013   BUN 34* 08/15/2013   CO2 29 08/15/2013   TSH 0.87 03/18/2013   INR 0.97 09/15/2012    Mm Digital Screening  05/17/2013   CLINICAL DATA:  Screening.  EXAM: DIGITAL SCREENING BILATERAL MAMMOGRAM WITH CAD  COMPARISON:  Previous exam(s).  ACR Breast Density Category b: There are scattered areas of fibroglandular density.  FINDINGS: There are no findings suspicious for malignancy. Images were processed with CAD.  IMPRESSION: No mammographic evidence of malignancy. A result letter of this screening mammogram will be mailed directly to the patient.  RECOMMENDATION: Screening mammogram in one year. (Code:SM-B-01Y)  BI-RADS CATEGORY  1: Negative   Electronically Signed   By: Lovey Newcomer M.D.   On: 05/17/2013 16:46       Assessment &  Plan:   LLQ pain, intermittent - ?kidney stone - small on on [place] abd in 2010 reviewed - onging eval with uro - reports "blood in urine" and other labs ok, scheduled for CT next week by uro - will send for ROI Continue meloxicam as per ortho for this and L OA symptoms follow up here if uro eval unremarkable and continued symptoms   Problem List Items Addressed This Visit   GERD (gastroesophageal reflux disease) - Primary     abd exam today benign Declines labs/imaging as done with uro this week and CT scan planned by uro for next week Treat with H2B pending other results from uro (ROI requested)    Relevant Medications      famotidine (PEPCID)  tablet    Other Visit Diagnoses   LLQ abdominal pain

## 2014-02-09 NOTE — Progress Notes (Signed)
Pre visit review using our clinic review tool, if applicable. No additional management support is needed unless otherwise documented below in the visit note. 

## 2014-02-09 NOTE — Patient Instructions (Addendum)
It was good to see you today.  We have reviewed your prior records including labs and tests today  Will obtain test results from Dr Teresa Pelton as discussed - possible kidney stone causing your pain and blood in urine!  Medications reviewed and updated Use pepcid 2x/day for acid reflux -  Your prescription(s) have been submitted to your pharmacy. Please take as directed and contact our office if you believe you are having problem(s) with the medication(s). No other changes recommended at this time.  If continued pain without orthopedic or urologic explanation and if acid reflux unimproved with medications, please arrange follow up with me as needed  follow up in Sept as scheduled- call sooner if needed  Gastroesophageal Reflux Disease, Adult Gastroesophageal reflux disease (GERD) happens when acid from your stomach flows up into the esophagus. When acid comes in contact with the esophagus, the acid causes soreness (inflammation) in the esophagus. Over time, GERD may create small holes (ulcers) in the lining of the esophagus. CAUSES   Increased body weight. This puts pressure on the stomach, making acid rise from the stomach into the esophagus.  Smoking. This increases acid production in the stomach.  Drinking alcohol. This causes decreased pressure in the lower esophageal sphincter (valve or ring of muscle between the esophagus and stomach), allowing acid from the stomach into the esophagus.  Late evening meals and a full stomach. This increases pressure and acid production in the stomach.  A malformed lower esophageal sphincter. Sometimes, no cause is found. SYMPTOMS   Burning pain in the lower part of the mid-chest behind the breastbone and in the mid-stomach area. This may occur twice a week or more often.  Trouble swallowing.  Sore throat.  Dry cough.  Asthma-like symptoms including chest tightness, shortness of breath, or wheezing. DIAGNOSIS  Your caregiver may be able to  diagnose GERD based on your symptoms. In some cases, X-rays and other tests may be done to check for complications or to check the condition of your stomach and esophagus. TREATMENT  Your caregiver may recommend over-the-counter or prescription medicines to help decrease acid production. Ask your caregiver before starting or adding any new medicines.  HOME CARE INSTRUCTIONS   Change the factors that you can control. Ask your caregiver for guidance concerning weight loss, quitting smoking, and alcohol consumption.  Avoid foods and drinks that make your symptoms worse, such as:  Caffeine or alcoholic drinks.  Chocolate.  Peppermint or mint flavorings.  Garlic and onions.  Spicy foods.  Citrus fruits, such as oranges, lemons, or limes.  Tomato-based foods such as sauce, chili, salsa, and pizza.  Fried and fatty foods.  Avoid lying down for the 3 hours prior to your bedtime or prior to taking a nap.  Eat small, frequent meals instead of large meals.  Wear loose-fitting clothing. Do not wear anything tight around your waist that causes pressure on your stomach.  Raise the head of your bed 6 to 8 inches with wood blocks to help you sleep. Extra pillows will not help.  Only take over-the-counter or prescription medicines for pain, discomfort, or fever as directed by your caregiver.  Do not take aspirin, ibuprofen, or other nonsteroidal anti-inflammatory drugs (NSAIDs). SEEK IMMEDIATE MEDICAL CARE IF:   You have pain in your arms, neck, jaw, teeth, or back.  Your pain increases or changes in intensity or duration.  You develop nausea, vomiting, or sweating (diaphoresis).  You develop shortness of breath, or you faint.  Your vomit is green,  yellow, black, or looks like coffee grounds or blood.  Your stool is red, bloody, or black. These symptoms could be signs of other problems, such as heart disease, gastric bleeding, or esophageal bleeding. MAKE SURE YOU:   Understand  these instructions.  Will watch your condition.  Will get help right away if you are not doing well or get worse. Document Released: 04/30/2005 Document Revised: 10/13/2011 Document Reviewed: 02/07/2011 [facility] Patient Information 2015 [place], [place]. This information is not intended to replace advice given to you by your health care provider. Make sure you discuss any questions you have with your health care provider. Kidney Stones Kidney stones (urolithiasis) are solid masses that form inside your kidneys. The intense pain is caused by the stone moving through the kidney, ureter, bladder, and urethra (urinary tract). When the stone moves, the ureter starts to spasm around the stone. The stone is usually passed in your pee (urine).  HOME CARE  Drink enough fluids to keep your pee clear or pale yellow. This helps to get the stone out.  Strain all pee through the provided strainer. Do not pee without peeing through the strainer, not even once. If you pee the stone out, catch it in the strainer. The stone may be as small as a grain of salt. Take this to your doctor. This will help your doctor figure out what you can do to try to prevent more kidney stones.  Only take medicine as told by your doctor.  Follow up with your doctor as told.  Get follow-up X-rays as told by your doctor. GET HELP IF: You have pain that gets worse even if you have been taking pain medicine. GET HELP RIGHT AWAY IF:   Your pain does not get better with medicine.  You have a fever or shaking chills.  Your pain increases and gets worse over 18 hours.  You have new belly (abdominal) pain.  You feel faint or pass out.  You are unable to pee. MAKE SURE YOU:   Understand these instructions.  Will watch your condition.  Will get help right away if you are not doing well or get worse. Document Released: 01/07/2008 Document Revised: 03/23/2013 Document Reviewed: 12/22/2012 [facility] Patient Information 2015  [place], [place]. This information is not intended to replace advice given to you by your health care provider. Make sure you discuss any questions you have with your health care provider.

## 2014-02-11 ENCOUNTER — Other Ambulatory Visit: Payer: Self-pay | Admitting: Internal Medicine

## 2014-02-13 ENCOUNTER — Ambulatory Visit: Payer: Medicare Other | Admitting: Internal Medicine

## 2014-02-20 ENCOUNTER — Other Ambulatory Visit: Payer: Self-pay | Admitting: Urology

## 2014-02-20 DIAGNOSIS — N2889 Other specified disorders of kidney and ureter: Secondary | ICD-10-CM

## 2014-03-08 ENCOUNTER — Encounter (INDEPENDENT_AMBULATORY_CARE_PROVIDER_SITE_OTHER): Payer: Self-pay

## 2014-03-08 ENCOUNTER — Ambulatory Visit
Admission: RE | Admit: 2014-03-08 | Discharge: 2014-03-08 | Disposition: A | Discharge status: Home / Self Care | Payer: Medicare Other | Source: Ambulatory Visit | Attending: Urology | Admitting: Urology

## 2014-03-08 DIAGNOSIS — N2889 Other specified disorders of kidney and ureter: Secondary | ICD-10-CM

## 2014-03-08 LAB — BASIC METABOLIC PANEL
CREATININE: 1 mg/dL (ref 0.5–1.1)
Potassium: 4.4 mmol/L (ref 3.4–5.3)
Sodium: 140 mmol/L (ref 137–147)

## 2014-03-15 ENCOUNTER — Encounter: Payer: Self-pay | Admitting: Internal Medicine

## 2014-03-20 ENCOUNTER — Ambulatory Visit (INDEPENDENT_AMBULATORY_CARE_PROVIDER_SITE_OTHER): Payer: Medicare Other | Admitting: Neurology

## 2014-03-20 ENCOUNTER — Encounter: Payer: Self-pay | Admitting: Neurology

## 2014-03-20 VITALS — BP 151/76 | HR 82 | Temp 97.4°F | Ht 59.0 in | Wt 145.0 lb

## 2014-03-20 DIAGNOSIS — R600 Localized edema: Secondary | ICD-10-CM

## 2014-03-20 DIAGNOSIS — G20 Parkinson's disease: Secondary | ICD-10-CM

## 2014-03-20 DIAGNOSIS — M25559 Pain in unspecified hip: Secondary | ICD-10-CM

## 2014-03-20 DIAGNOSIS — M4120 Other idiopathic scoliosis, site unspecified: Secondary | ICD-10-CM

## 2014-03-20 DIAGNOSIS — R609 Edema, unspecified: Secondary | ICD-10-CM

## 2014-03-20 DIAGNOSIS — M545 Low back pain, unspecified: Secondary | ICD-10-CM

## 2014-03-20 DIAGNOSIS — M25551 Pain in right hip: Secondary | ICD-10-CM

## 2014-03-20 DIAGNOSIS — Z96649 Presence of unspecified artificial hip joint: Secondary | ICD-10-CM

## 2014-03-20 MED ORDER — PRAMIPEXOLE DIHYDROCHLORIDE 0.5 MG PO TABS: ORAL_TABLET | ORAL | Status: DC

## 2014-03-20 MED ORDER — CARBIDOPA-LEVODOPA 25-100 MG PO TABS
ORAL_TABLET | ORAL | Status: DC
Start: 2014-03-20 — End: 2014-04-07

## 2014-03-20 NOTE — Patient Instructions (Addendum)
I would like you to start Sinemet (generic name: carbidopa-levodopa) 25/100 mg: Take half a pill twice daily (8 AM and noon) for one week, then half a pill 3 times a day (8 AM, noon, and 4 PM) thereafter. Please try to take the medication away from you mealtimes, that is, ideally either one hour before or 2 hours after your meal to ensure optimal absorption. The medication can interfere with the protein content of your meal and trying to the protein in your food and therefore not get fully absorbed.  Common side effects reported are: Nausea, vomiting, sedation, confusion, lightheadedness. Rare side effects include hallucinations, severe nausea or vomiting, diarrhea and significant drop in blood pressure especially when going from lying to standing or from sitting to standing.   We will gradually lower your Mirapex: 0.5 mg strength. Please start taking just 1/2 pill three times a day, you can do this at the same time as the Sinemet.   Use your walker at all times.

## 2014-03-20 NOTE — Progress Notes (Signed)
Subjective:    Patient ID: Dawn Ashley is a 78 y.o. female.  HPI   Interim history:   Dawn Ashley is a 78 year-old LH lady, with an underlying medical history of carotid artery disease, lumbar spine disease with multilevel degenerative joint disease and hypertrophic facet arthritis, s/p back surgery in 2008, cataract surgery and R THR on 09/20/2012, who presents for followup consultation of her L sided predominant Parkinson's disease, which was diagnosed in 2006 with symptoms dating back to a few years prior. The patient is unaccompanied today. I last saw her on 09/19/2013, at which time she reported having reduced her gabapentin. She was started on Gabapentin in 11/13, by her sports medicine doctor and was up to 300 mg tid, but has had sleepiness and blurry vision with it. She was scheduled for a back injection on 10/06/13 under Dr. Regino Schultze. Dr. Turner Daniels is her orthopedic doctor. She had started PT at [facility], 3 times a week. She felt stable with her PD Sx, and has never been on C/L. I kept her on Mirapex and Azilect. In March she called back reporting swelling and redness in her toes. I suggested she have it checked out by her primary care physician as it may indicate several issues such as with circulation, venous stasis, or cellulitis. She has been seeing her primary care physician for lower extremity swelling.  Today, she reports that she is no longer on gabapentin, she felt it was making her too sleepy. She has thankfully not fallen, she has HH PT.    I saw her on 03/08/2013, at which time I felt she was quite stable and that she had recovered well from her recent total hip replacement surgery. She was complaining of daytime somnolence and I suggested changing her Mirapex immediate release to long-acting. However, she called back a few days later reporting that the long-acting Mirapex was not covered by her insurance and we changed her back to immediate release Mirapex.  I first met her on  12/06/12 at which time I did not change her medications. She previously followed with Dr. Avie Echevaria and was last seen by him on 09/08/2012, which time he felt that her sleepiness was in part d/t tramadol and stopped it. He also obtained blood work including CBC, CMP, TSH and urinalysis as well as B12 level and ordered a brain MRI. She had problems with her gait which he primarily attributed to right hip pain.  Her blood work showed mild elevated alkaline phosphatase and BUN of 28 and creatinine of 1.17. Her son was called back and advised that she should increase her fluid intake. Her urinalysis was not consistent with clear-cut UTI. Since her last visit with Dr. Sandria Manly she had right hip replacement surgery on 09/20/12. She has been in rehabilitation. She did not end up getting her brain scan because she had surgery soon after her last visit and was in rehabilitation since then. Her sleepiness and confusion improved after her pain medication was changed from tramadol to diclofenac. She moved to [facility]. She had developed some LE swelling and was switched from HCTZ to Lasix and advised to reduce her NSAIDs.   Her Past Medical History Is Significant For: Past Medical History  Diagnosis Date  . Mitral valve prolapse   . Menopause   . Osteopenia     sacral insuff fx on MRI L spine 01/2011  . Parkinson disease 01/2005 dx  . Back pain   . Displacement of lumbar  intervertebral disc without myelopathy   . Kidney stones   . Osteoporosis with fracture   . Hypertension   . Heart murmur     h/o MVP, pt. reports murmur is almost resolved  . Arthritis     osteoporosis, osteoarthritis     Her Past Surgical History Is Significant For: Past Surgical History  Procedure Laterality Date  . Tonsillectomy    . Cataract extraction w/ intraocular lens  implant, bilateral  2011  2004  . Dilation and curettage of uterus  2012  . Back surgery  2009    spurs resting on nerve, lumbar  . Total  hip arthroplasty Right 09/20/2012    Procedure: TOTAL HIP ARTHROPLASTY;  Surgeon: Kerin Salen, MD;  Location: [facility];  Service: Orthopedics;  Laterality: Right;  . Orif pelvic fracture Right 09/20/2012    Procedure: OPEN REDUCTION INTERNAL FIXATION (ORIF) PELVIC FRACTURE;  Surgeon: Kerin Salen, MD;  Location: [facility];  Service: Orthopedics;  Laterality: Right;    Her Family History Is Significant For: Family History  Problem Relation Age of Onset  . Breast cancer Maternal Grandmother   . Arthritis Maternal Grandmother   . Hypertension Other   . Osteoporosis Other   . Breast cancer Mother     had mastectomy in 1982  . Heart disease Mother   . Stroke Mother     mild, 2001  . Hypertension Mother   . Osteoporosis Mother     also had spine surgery 1987, & hip repalcement  . Arthritis Sister   . Hypertension Sister   . Parkinsonism Maternal Uncle   . Arthritis Maternal Grandfather     Her Social History Is Significant For: History   Social History  . Marital Status: Widowed    Spouse Name: N/A    Number of Children: 2  . Years of Education: 12   Occupational History  .      retired   Social History Main Topics  . Smoking status: Never Smoker   . Smokeless tobacco: Never Used  . Alcohol Use: 1.2 oz/week    2 Glasses of wine per week     Comment: glass of wine, occas.  . Drug Use: No  . Sexual Activity: None   Other Topics Concern  . None   Social History Narrative   Patient is right handed, resides alone    Her Allergies Are:  Allergies  Allergen Reactions  . Tramadol Other (See Comments)    Confusion and sleepy  :   Her Current Medications Are:  Outpatient Encounter Prescriptions as of 03/20/2014  Medication Sig  . alendronate (FOSAMAX) 70 MG tablet TAKE 1 TABLET EVERY SATURDAY AT 6 P.M., TAKE WITH A FULL GLASS OF WATER ON AN EMPTY STOMACH  . AMOXICILLIN PO Take by mouth. Prn, only when going to dentist  . aspirin 81 MG EC tablet Take 1 tablet (81 mg total)  by mouth daily.  . Calcium Carbonate-Vitamin D (CALCIUM + D PO) Take 1 tablet by mouth 2 (two) times daily.  . Cholecalciferol (VITAMIN D) 2000 UNITS tablet Take 4,000 Units by mouth daily.  Marland Kitchen conjugated estrogens (PREMARIN) vaginal cream Place 1 g vaginally 2 (two) times a week. Every Monday and Thursday.  . famotidine (PEPCID) 20 MG tablet Take 1 tablet (20 mg total) by mouth 2 (two) times daily.  . fluconazole (DIFLUCAN) 150 MG tablet Take 150 mg by mouth.  . furosemide (LASIX) 40 MG tablet TAKE 1 TABLET DAILY  . KLOR-CON  M10 10 MEQ tablet TAKE 1 TABLET DAILY  . meloxicam (MOBIC) 15 MG tablet Take 15 mg by mouth.  . potassium chloride (K-DUR) 10 MEQ tablet Take 1 tablet (10 mEq total) by mouth daily.  . pramipexole (MIRAPEX) 0.5 MG tablet TAKE 1 TABLET THREE TIMES A DAY  . rasagiline (AZILECT) 1 MG TABS tablet Take 1 tablet (1 mg total) by mouth daily.  . [DISCONTINUED] gabapentin (NEURONTIN) 300 MG capsule Take 1 capsule (300 mg total) by mouth at bedtime.  :  Review of Systems:  Out of a complete 14 point review of systems, all are reviewed and negative with the exception of these symptoms as listed below:   Review of Systems  All other systems reviewed and are negative.   Objective:  Neurologic Exam  Physical Exam Physical Examination:   Filed Vitals:   03/20/14 1440  BP: 151/76  Pulse: 82  Temp: 97.4 F (36.3 C)    General Examination: The patient is a very pleasant 78 y.o. female in no acute distress.  HEENT: Normocephalic, atraumatic, pupils are equal, round and reactive to light and accommodation. Extraocular tracking shows mild saccadic breakdown without nystagmus noted. Funduscopy is normal. There is no limitation to her gaze. There is mild decrease in eye blink rate. Hearing is intact. Face is symmetric with mild facial masking and normal facial sensation. There is no lip, neck or jaw tremor. Neck is mildly rigid with intact passive ROM. There are no carotid bruits  on auscultation. Oropharynx exam reveals moderate mouth dryness. No significant airway crowding is noted. Mallampati is class II. Tongue protrudes centrally and palate elevates symmetrically. There is no drooling.   Chest: is clear to auscultation without wheezing, rhonchi or crackles noted.  Heart: sounds are regular and normal without murmurs, rubs or gallops noted.   Abdomen: is soft, non-tender and non-distended with normal bowel sounds appreciated on auscultation.  Extremities: There is more edema in the distal lower extremities bilaterally, and she is not wearing her compression stockings today.    Skin: is warm and dry with no trophic changes noted.  Musculoskeletal: exam reveals: R hip pain and R LBP, and she leans to the left, sitting and standing. Knees are mildly swollen. No other joint deformities, tenderness or joint swelling or erythema is noted.   Neurologically:  Mental status: The patient is awake and alert, paying good  attention. She is able to completely provide the history. She is oriented to: person, place, situation, day of week, month of year and year. Her memory, attention, language and knowledge are intact. There is no aphasia, agnosia, apraxia or anomia. There is a no bradyphrenia. Speech is mildly hypophonic with no dysarthria noted. Mood is congruent and affect is normal.   Cranial nerves are as described above under HEENT exam. In addition, shoulder shrug is normal with equal shoulder height noted.  Motor exam: Normal bulk, and strength for age is noted. There are no dyskinesias noted. Tone is mildly rigid with absence of cogwheeling in the bilateral extremities. There is overall mild bradykinesia. There is no drift or rebound. There is no tremor. Romberg is negative. She brought her 2 wheeled walker. Reflexes are 1+ in the upper extremities and 1+ in the lower extremities. Fine motor skills exam reveals: Finger taps are mildly impaired on the right and mildly  impaired on the left. Hand movements are mildly impaired on the right and mildly impaired on the left. RAP (rapid alternating patting) is mildly impaired on the right  and moderately impaired on the left. Foot taps are mildly impaired on the right and moderately impaired on the left. Foot agility (in the form of heel stomping) is mildly impaired on the right and mildly impaired on the left.    Cerebellar testing shows no dysmetria or intention tremor on finger to nose testing. Heel to shin is not checked. There is no truncal or gait ataxia.   Sensory exam is intact to light touch, PP, temperature sense and vibration sense in the UEs and LEs.   Gait, station and balance exam: She stands up with moderate difficulty, has to push herself up and has a moderately stooped posture with kyphoscoliosis and leans to the L. She uses her 2 wheeled walker well. She has a limp on the R. She turns in 3 steps.   Assessment and Plan:   In summary, Dawn Ashley is a very pleasant 77 year old female with a history of left-sided predominant Parkinson's disease diagnosed in 2006 but with symptoms dating back to a few years prior to that. She has remained fairly stable but her swelling has become worse. Overall she has done well after her right total hip replacement surgery. She is complaining of daytime somnolence despite coming off of gabapentin. I worry about the Mirapex causing her some residual sleepiness as well as the lower chimney swelling and today I suggested that we gradually reduce the Mirapex and introduce a small dose of levodopa. To that end, I would like for her to cut the Mirapex dose in half and she will start on Sinemet 25-100 mg strength half a pill twice a day namely at 8 AM and noon for a week then half a pill 3 times a day thereafter. I would like to have her reevaluated in about 3 months to see how she is doing. She can see our nurse practitioner Jeani Hawking and then I will see her back after that. She was in  agreement. I gave her written instructions as well as new prescriptions for Mirapex and Sinemet and also talked to her about potential side effects with Sinemet. I encouraged her to call with any interim questions, concerns, problems or updates and refill requests.

## 2014-03-23 ENCOUNTER — Other Ambulatory Visit: Payer: Self-pay | Admitting: Interventional Radiology

## 2014-03-23 DIAGNOSIS — N2889 Other specified disorders of kidney and ureter: Secondary | ICD-10-CM

## 2014-04-07 ENCOUNTER — Encounter (HOSPITAL_COMMUNITY): Payer: Self-pay | Admitting: Pharmacy Technician

## 2014-04-12 NOTE — Progress Notes (Signed)
Please put orders in Epic surgery 04-21-14 pre op 04-18-14 Thanks

## 2014-04-13 NOTE — Progress Notes (Signed)
Please put orders in Epic surgery 04-21-14 pre op 04-18-14 Thank

## 2014-04-14 NOTE — Progress Notes (Signed)
Please put orders in Epic surgery 04-21-14 pre op 04-18-14 Thanks

## 2014-04-18 ENCOUNTER — Encounter (HOSPITAL_COMMUNITY)
Admission: RE | Admit: 2014-04-18 | Discharge: 2014-04-18 | Disposition: A | Discharge status: Home / Self Care | Payer: Medicare Other | Source: Ambulatory Visit | Attending: Interventional Radiology | Admitting: Interventional Radiology

## 2014-04-18 ENCOUNTER — Encounter (HOSPITAL_COMMUNITY): Payer: Self-pay

## 2014-04-18 ENCOUNTER — Other Ambulatory Visit: Payer: Self-pay | Admitting: Radiology

## 2014-04-18 DIAGNOSIS — Z79899 Other long term (current) drug therapy: Secondary | ICD-10-CM | POA: Diagnosis not present

## 2014-04-18 DIAGNOSIS — G20 Parkinson's disease: Secondary | ICD-10-CM | POA: Diagnosis not present

## 2014-04-18 DIAGNOSIS — Z888 Allergy status to other drugs, medicaments and biological substances status: Secondary | ICD-10-CM | POA: Diagnosis not present

## 2014-04-18 DIAGNOSIS — I739 Peripheral vascular disease, unspecified: Secondary | ICD-10-CM | POA: Diagnosis not present

## 2014-04-18 DIAGNOSIS — N289 Disorder of kidney and ureter, unspecified: Secondary | ICD-10-CM | POA: Diagnosis not present

## 2014-04-18 DIAGNOSIS — M1990 Unspecified osteoarthritis, unspecified site: Secondary | ICD-10-CM | POA: Diagnosis not present

## 2014-04-18 DIAGNOSIS — K219 Gastro-esophageal reflux disease without esophagitis: Secondary | ICD-10-CM | POA: Diagnosis not present

## 2014-04-18 DIAGNOSIS — I10 Essential (primary) hypertension: Secondary | ICD-10-CM | POA: Diagnosis not present

## 2014-04-18 DIAGNOSIS — Z7982 Long term (current) use of aspirin: Secondary | ICD-10-CM | POA: Diagnosis not present

## 2014-04-18 HISTORY — DX: Frequency of micturition: R35.0

## 2014-04-18 HISTORY — DX: Gastro-esophageal reflux disease without esophagitis: K21.9

## 2014-04-18 HISTORY — DX: Other specified disorders of kidney and ureter: N28.89

## 2014-04-18 HISTORY — DX: Nonrheumatic mitral (valve) prolapse: I34.1

## 2014-04-18 HISTORY — DX: Edema, unspecified: R60.9

## 2014-04-18 LAB — PROTIME-INR
INR: 0.94 (ref 0.00–1.49)
Prothrombin Time: 12.6 seconds (ref 11.6–15.2)

## 2014-04-18 LAB — BASIC METABOLIC PANEL
Anion gap: 11 (ref 5–15)
BUN: 35 mg/dL — ABNORMAL HIGH (ref 6–23)
CO2: 32 mEq/L (ref 19–32)
Calcium: 10.3 mg/dL (ref 8.4–10.5)
Chloride: 101 mEq/L (ref 96–112)
Creatinine, Ser: 1.33 mg/dL — ABNORMAL HIGH (ref 0.50–1.10)
GFR calc non Af Amer: 38 mL/min — ABNORMAL LOW (ref >90)
GFR, EST AFRICAN AMERICAN: 44 mL/min — AB (ref >90)
Glucose, Bld: 94 mg/dL (ref 70–99)
POTASSIUM: 4.4 meq/L (ref 3.7–5.3)
SODIUM: 144 meq/L (ref 137–147)

## 2014-04-18 LAB — CBC WITH DIFFERENTIAL/PLATELET
BASOS PCT: 1 % (ref 0–1)
Basophils Absolute: 0.1 10*3/uL (ref 0.0–0.1)
Eosinophils Absolute: 0.5 10*3/uL (ref 0.0–0.7)
Eosinophils Relative: 7 % — ABNORMAL HIGH (ref 0–5)
HCT: 36.5 % (ref 36.0–46.0)
Hemoglobin: 11.8 g/dL — ABNORMAL LOW (ref 12.0–15.0)
LYMPHS PCT: 33 % (ref 12–46)
Lymphs Abs: 2.3 10*3/uL (ref 0.7–4.0)
MCH: 30.2 pg (ref 26.0–34.0)
MCHC: 32.3 g/dL (ref 30.0–36.0)
MCV: 93.4 fL (ref 78.0–100.0)
MONOS PCT: 11 % (ref 3–12)
Monocytes Absolute: 0.8 10*3/uL (ref 0.1–1.0)
NEUTROS ABS: 3.4 10*3/uL (ref 1.7–7.7)
NEUTROS PCT: 48 % (ref 43–77)
Platelets: 189 10*3/uL (ref 150–400)
RBC: 3.91 MIL/uL (ref 3.87–5.11)
RDW: 14.7 % (ref 11.5–15.5)
WBC: 6.9 10*3/uL (ref 4.0–10.5)

## 2014-04-18 LAB — APTT: aPTT: 27 seconds (ref 24–37)

## 2014-04-18 NOTE — Progress Notes (Signed)
Abnormal BMET faxed to Dr. Kathlene Cote

## 2014-04-18 NOTE — Patient Instructions (Signed)
[facility]  04/18/2014                           YOUR PROCEDURE IS SCHEDULED ON: 04/21/14               ENTER THRU [place] MAIN HOSPITAL ENTRANCE AND                           FOLLOW  SIGNS TO RADIOLOGY                 ARRIVE AT RADIOLOGY AT: 6:00 AM               CALL THIS NUMBER IF ANY PROBLEMS THE DAY OF SURGERY :               [phone]                                REMEMBER:   Do not eat food or drink liquids AFTER MIDNIGHT                  Take these medicines the morning of surgery with               A SIPS OF WATER :   SINEMET / PEPCID / TOVIAZ / MIRAPEX / AZILECT    Do not wear jewelry, make-up   Do not wear lotions, powders, or perfumes.   Do not shave legs or underarms 12 hrs. before surgery (men may shave face)  Do not bring valuables to the hospital.  Contacts, dentures or bridgework may not be worn into surgery.  Leave suitcase in the car. After surgery it may be brought to your room.  For patients admitted to the hospital more than one night, checkout time is            11:00 AM                                                    ________________________________________________________________________                                                                        [name]  Before surgery, you can play an important role.  Because skin is not sterile, your skin needs to be as free of germs as possible.  You can reduce the number of germs on your skin by washing with CHG (chlorahexidine gluconate) soap before surgery.  CHG is an antiseptic cleaner which kills germs and bonds with the skin to continue killing germs even after washing. Please DO NOT use if you have an allergy to CHG or antibacterial soaps.  If your skin becomes reddened/irritated stop using the CHG and inform your nurse when you arrive at Short Stay. Do not shave (including legs and underarms) for at least 48 hours prior to the first CHG shower.   You may shave your face. Please  follow these instructions carefully:   1.  Shower with CHG Soap the night before surgery and the  morning of Surgery.   2.  If you choose to wash your hair, wash your hair first as usual with your  normal  Shampoo.   3.  After you shampoo, rinse your hair and body thoroughly to remove the  shampoo.                                         4.  Use CHG as you would any other liquid soap.  You can apply chg directly  to the skin and wash . Gently wash with scrungie or clean wascloth    5.  Apply the CHG Soap to your body ONLY FROM THE NECK DOWN.   Do not use on open                           Wound or open sores. Avoid contact with eyes, ears mouth and genitals (private parts).                        Genitals (private parts) with your normal soap.              6.  Wash thoroughly, paying special attention to the area where your surgery  will be performed.   7.  Thoroughly rinse your body with warm water from the neck down.   8.  DO NOT shower/wash with your normal soap after using and rinsing off  the CHG Soap .                9.  Pat yourself dry with a clean towel.             10.  Wear clean pajamas.             11.  Place clean sheets on your bed the night of your first shower and do not  sleep with pets.  Day of Surgery : Do not apply any lotions/deodorants the morning of surgery.  Please wear clean clothes to the hospital/surgery center.  FAILURE TO FOLLOW THESE INSTRUCTIONS MAY RESULT IN THE CANCELLATION OF YOUR SURGERY    PATIENT SIGNATURE_________________________________  ______________________________________________________________________

## 2014-04-19 ENCOUNTER — Other Ambulatory Visit: Payer: Self-pay | Admitting: Radiology

## 2014-04-20 ENCOUNTER — Other Ambulatory Visit: Payer: Self-pay | Admitting: Radiology

## 2014-04-21 ENCOUNTER — Encounter (HOSPITAL_COMMUNITY): Payer: Medicare Other | Admitting: Anesthesiology

## 2014-04-21 ENCOUNTER — Encounter (HOSPITAL_COMMUNITY)
Admission: RE | Disposition: A | Discharge status: Home / Self Care | Payer: Self-pay | Source: Ambulatory Visit | Attending: Interventional Radiology

## 2014-04-21 ENCOUNTER — Observation Stay (HOSPITAL_COMMUNITY): Payer: Medicare Other

## 2014-04-21 ENCOUNTER — Encounter (HOSPITAL_COMMUNITY): Payer: Self-pay | Admitting: *Deleted

## 2014-04-21 ENCOUNTER — Ambulatory Visit (HOSPITAL_COMMUNITY)
Admission: RE | Admit: 2014-04-21 | Discharge: 2014-04-21 | Disposition: A | Discharge status: Home / Self Care | Payer: Medicare Other | Source: Ambulatory Visit | Attending: Interventional Radiology | Admitting: Interventional Radiology

## 2014-04-21 ENCOUNTER — Encounter (HOSPITAL_COMMUNITY): Payer: Self-pay | Admitting: General Practice

## 2014-04-21 ENCOUNTER — Inpatient Hospital Stay (HOSPITAL_COMMUNITY): Payer: Medicare Other | Admitting: Anesthesiology

## 2014-04-21 ENCOUNTER — Observation Stay (HOSPITAL_COMMUNITY)
Admission: RE | Admit: 2014-04-21 | Discharge: 2014-04-22 | Disposition: A | Discharge status: Home / Self Care | Payer: Medicare Other | Source: Ambulatory Visit | Attending: Interventional Radiology | Admitting: Interventional Radiology

## 2014-04-21 DIAGNOSIS — I739 Peripheral vascular disease, unspecified: Secondary | ICD-10-CM | POA: Insufficient documentation

## 2014-04-21 DIAGNOSIS — I10 Essential (primary) hypertension: Secondary | ICD-10-CM | POA: Insufficient documentation

## 2014-04-21 DIAGNOSIS — K219 Gastro-esophageal reflux disease without esophagitis: Secondary | ICD-10-CM | POA: Insufficient documentation

## 2014-04-21 DIAGNOSIS — Z7982 Long term (current) use of aspirin: Secondary | ICD-10-CM | POA: Insufficient documentation

## 2014-04-21 DIAGNOSIS — G20A1 Parkinson's disease without dyskinesia, without mention of fluctuations: Secondary | ICD-10-CM | POA: Insufficient documentation

## 2014-04-21 DIAGNOSIS — M1990 Unspecified osteoarthritis, unspecified site: Secondary | ICD-10-CM | POA: Insufficient documentation

## 2014-04-21 DIAGNOSIS — N289 Disorder of kidney and ureter, unspecified: Secondary | ICD-10-CM | POA: Diagnosis not present

## 2014-04-21 DIAGNOSIS — Z888 Allergy status to other drugs, medicaments and biological substances status: Secondary | ICD-10-CM | POA: Insufficient documentation

## 2014-04-21 DIAGNOSIS — N2889 Other specified disorders of kidney and ureter: Secondary | ICD-10-CM | POA: Diagnosis present

## 2014-04-21 DIAGNOSIS — Z79899 Other long term (current) drug therapy: Secondary | ICD-10-CM | POA: Insufficient documentation

## 2014-04-21 DIAGNOSIS — G20 Parkinson's disease: Secondary | ICD-10-CM | POA: Insufficient documentation

## 2014-04-21 LAB — BASIC METABOLIC PANEL
Anion gap: 12 (ref 5–15)
BUN: 40 mg/dL — AB (ref 6–23)
CALCIUM: 8.7 mg/dL (ref 8.4–10.5)
CO2: 25 mEq/L (ref 19–32)
CREATININE: 1.19 mg/dL — AB (ref 0.50–1.10)
Chloride: 104 mEq/L (ref 96–112)
GFR calc Af Amer: 50 mL/min — ABNORMAL LOW (ref >90)
GFR calc non Af Amer: 43 mL/min — ABNORMAL LOW (ref >90)
GLUCOSE: 87 mg/dL (ref 70–99)
Potassium: 4.1 mEq/L (ref 3.7–5.3)
Sodium: 141 mEq/L (ref 137–147)

## 2014-04-21 SURGERY — RADIO FREQUENCY ABLATION
Anesthesia: General | Laterality: Right

## 2014-04-21 MED ORDER — ONDANSETRON HCL 4 MG/2ML IJ SOLN
INTRAMUSCULAR | Status: DC | PRN
  Administered 2014-04-21: 4 mg via INTRAVENOUS

## 2014-04-21 MED ORDER — ONDANSETRON HCL 4 MG/2ML IJ SOLN
INTRAMUSCULAR | Status: AC
  Filled 2014-04-21: qty 2

## 2014-04-21 MED ORDER — DEXAMETHASONE SODIUM PHOSPHATE 10 MG/ML IJ SOLN
INTRAMUSCULAR | Status: DC | PRN
  Administered 2014-04-21: 5 mg via INTRAVENOUS

## 2014-04-21 MED ORDER — SODIUM CHLORIDE 0.9 % IV SOLN: INTRAVENOUS | Status: DC

## 2014-04-21 MED ORDER — ROCURONIUM BROMIDE 100 MG/10ML IV SOLN
INTRAVENOUS | Status: DC | PRN
  Administered 2014-04-21: 40 mg via INTRAVENOUS
  Administered 2014-04-21: 10 mg via INTRAVENOUS

## 2014-04-21 MED ORDER — LIDOCAINE HCL (CARDIAC) 20 MG/ML IV SOLN
INTRAVENOUS | Status: DC | PRN
  Administered 2014-04-21: 40 mg via INTRAVENOUS

## 2014-04-21 MED ORDER — HYDROCODONE-ACETAMINOPHEN 5-325 MG PO TABS: 1.0000 | ORAL_TABLET | ORAL | Status: DC | PRN

## 2014-04-21 MED ORDER — NEOSTIGMINE METHYLSULFATE 10 MG/10ML IV SOLN
INTRAVENOUS | Status: DC | PRN
  Administered 2014-04-21: 4 mg via INTRAVENOUS

## 2014-04-21 MED ORDER — MIDAZOLAM HCL 2 MG/2ML IJ SOLN
INTRAMUSCULAR | Status: AC
  Filled 2014-04-21: qty 2

## 2014-04-21 MED ORDER — SODIUM CHLORIDE 0.9 % IV SOLN
10.0000 mg | INTRAVENOUS | Status: DC | PRN
  Administered 2014-04-21: 5 ug/min via INTRAVENOUS
  Administered 2014-04-21: 10 ug/min via INTRAVENOUS

## 2014-04-21 MED ORDER — FENTANYL CITRATE 0.05 MG/ML IJ SOLN
INTRAMUSCULAR | Status: AC
  Filled 2014-04-21: qty 5

## 2014-04-21 MED ORDER — SENNOSIDES-DOCUSATE SODIUM 8.6-50 MG PO TABS
1.0000 | ORAL_TABLET | Freq: Every day | ORAL | Status: DC | PRN
Start: 2014-04-21 — End: 2014-04-22
  Filled 2014-04-21: qty 1

## 2014-04-21 MED ORDER — GLYCOPYRROLATE 0.2 MG/ML IJ SOLN
INTRAMUSCULAR | Status: DC | PRN
  Administered 2014-04-21: .6 mg via INTRAVENOUS

## 2014-04-21 MED ORDER — FENTANYL CITRATE 0.05 MG/ML IJ SOLN: 25.0000 ug | INTRAMUSCULAR | Status: DC | PRN

## 2014-04-21 MED ORDER — MIDAZOLAM HCL 5 MG/5ML IJ SOLN
INTRAMUSCULAR | Status: DC | PRN
  Administered 2014-04-21 (×2): 0.5 mg via INTRAVENOUS

## 2014-04-21 MED ORDER — FENTANYL CITRATE 0.05 MG/ML IJ SOLN
INTRAMUSCULAR | Status: DC | PRN
  Administered 2014-04-21 (×2): 25 ug via INTRAVENOUS

## 2014-04-21 MED ORDER — ROCURONIUM BROMIDE 100 MG/10ML IV SOLN
INTRAVENOUS | Status: AC
  Filled 2014-04-21: qty 1

## 2014-04-21 MED ORDER — PROPOFOL 10 MG/ML IV BOLUS
INTRAVENOUS | Status: AC
  Filled 2014-04-21: qty 20

## 2014-04-21 MED ORDER — CEFAZOLIN SODIUM-DEXTROSE 2-3 GM-% IV SOLR
2.0000 g | Freq: Once | INTRAVENOUS | Status: AC
  Administered 2014-04-21: 2 g via INTRAVENOUS
  Filled 2014-04-21 (×2): qty 50

## 2014-04-21 MED ORDER — DOCUSATE SODIUM 100 MG PO CAPS
100.0000 mg | ORAL_CAPSULE | Freq: Two times a day (BID) | ORAL | Status: DC
  Administered 2014-04-21: 100 mg via ORAL
  Filled 2014-04-21 (×3): qty 1

## 2014-04-21 MED ORDER — LIDOCAINE HCL (CARDIAC) 20 MG/ML IV SOLN
INTRAVENOUS | Status: AC
  Filled 2014-04-21: qty 5

## 2014-04-21 MED ORDER — ROCURONIUM BROMIDE 100 MG/10ML IV SOLN
INTRAVENOUS | Status: AC
  Filled 2014-04-21: qty 2

## 2014-04-21 MED ORDER — SODIUM CHLORIDE 0.9 % IV SOLN
INTRAVENOUS | Status: DC
  Administered 2014-04-21: 75 mL/h via INTRAVENOUS
  Administered 2014-04-21: 07:00:00 via INTRAVENOUS

## 2014-04-21 MED ORDER — PROPOFOL 10 MG/ML IV BOLUS
INTRAVENOUS | Status: DC | PRN
  Administered 2014-04-21: 100 mg via INTRAVENOUS

## 2014-04-21 MED ORDER — ONDANSETRON HCL 4 MG/2ML IJ SOLN: 4.0000 mg | Freq: Four times a day (QID) | INTRAMUSCULAR | Status: DC | PRN

## 2014-04-21 MED ORDER — LACTATED RINGERS IV SOLN
INTRAVENOUS | Status: DC | PRN
  Administered 2014-04-21: 06:00:00 via INTRAVENOUS
  Administered 2014-04-21: 1000 mL

## 2014-04-21 NOTE — Anesthesia Preprocedure Evaluation (Signed)
Anesthesia Evaluation  Patient identified by MRN, date of birth, ID band Patient awake    Reviewed: Allergy & Precautions, H&P , NPO status , Patient's Chart, lab work & pertinent test results  History of Anesthesia Complications Negative for: history of anesthetic complications  Airway Mallampati: III TM Distance: >3 FB Neck ROM: Limited    Dental no notable dental hx.    Pulmonary neg pulmonary ROS,  breath sounds clear to auscultation  Pulmonary exam normal       Cardiovascular hypertension, Pt. on medications + Peripheral Vascular Disease + Valvular Problems/Murmurs MVP Rhythm:Regular Rate:Normal     Neuro/Psych  Neuromuscular disease negative psych ROS   GI/Hepatic Neg liver ROS, GERD-  Medicated and Controlled,  Endo/Other  negative endocrine ROS  Renal/GU Renal disease  negative genitourinary   Musculoskeletal  (+) Arthritis -, Osteoarthritis,    Abdominal   Peds negative pediatric ROS (+)  Hematology negative hematology ROS (+)   Anesthesia Other Findings   Reproductive/Obstetrics negative OB ROS                           Anesthesia Physical Anesthesia Plan  ASA: III  Anesthesia Plan: General   Post-op Pain Management:    Induction: Intravenous  Airway Management Planned: Oral ETT  Additional Equipment:   Intra-op Plan:   Post-operative Plan: Extubation in OR  Informed Consent: I have reviewed the patients History and Physical, chart, labs and discussed the procedure including the risks, benefits and alternatives for the proposed anesthesia with the patient or authorized representative who has indicated his/her understanding and acceptance.   Dental advisory given  Plan Discussed with: CRNA  Anesthesia Plan Comments:         Anesthesia Quick Evaluation

## 2014-04-21 NOTE — Anesthesia Postprocedure Evaluation (Signed)
  Anesthesia Post-op Note  Patient: Dawn Ashley  Procedure(s) Performed: Procedure(s) (LRB): RIGHT RENAL CRYOABLATION (Right)  Patient Location: PACU  Anesthesia Type: General  Level of Consciousness: awake and alert   Airway and Oxygen Therapy: Patient Spontanous Breathing  Post-op Pain: mild  Post-op Assessment: Post-op Vital signs reviewed, Patient's Cardiovascular Status Stable, Respiratory Function Stable, Patent Airway and No signs of Nausea or vomiting  Last Vitals:  Filed Vitals:   04/21/14 1215  BP: 166/67  Pulse:   Temp: 36.4 C  Resp: 14    Post-op Vital Signs: stable   Complications: No apparent anesthesia complications

## 2014-04-21 NOTE — Procedures (Signed)
Procedure:  CT guided cryoablation of right renal mass Anesthesia:  General Findings:  Cryoablation of posterolateral right renal tumor with single 2.4 mm Endocare cryo probe. Hydrodissection between mass and liver performed with 90 mL of sterile saline prior to cryoablation. No room to perform biopsy.   Procedure complicated by small posterior basilar right PTX.  Suctioned out with 5 Fr Yueh catheter. Will follow with CXR. Plan:  Overnight observation.  CXR in 2 hours. Bedrest for at least 6 hours.  Foley cath for at least 6 hours.

## 2014-04-21 NOTE — H&P (Signed)
Patient seen and examined.  Presents for cryoablation of right renal tumor.  Possible biopsy of mass at same time.  Will proceed under general anesthesia.

## 2014-04-21 NOTE — Progress Notes (Signed)
Chief Complaint: Status post cryoablation of right renal tumor.  History of Present Illness: Dawn Ashley is a 78 y.o. female.  She is resting comfortably after cryoablation.  No complaints of pain.  No nausea or vomiting.  Past Medical History  Diagnosis Date  . Parkinson disease 01/2005 dx  . Back pain   . Kidney stones   . Heart murmur     h/o MVP, pt. reports murmur is almost resolved  . Arthritis     osteoporosis, osteoarthritis   . Swelling     legs / feet - takes Lasix to control  . GERD (gastroesophageal reflux disease)   . Renal mass, right   . Frequency of urination   . MVP (mitral valve prolapse)     Past Surgical History  Procedure Laterality Date  . Tonsillectomy    . Cataract extraction w/ intraocular lens  implant, bilateral  2011  2004  . Dilation and curettage of uterus  2012  . Back surgery  2009    spurs resting on nerve, lumbar  . Total hip arthroplasty Right 09/20/2012    Procedure: TOTAL HIP ARTHROPLASTY;  Surgeon: Kerin Salen, MD;  Location: [place];  Service: Orthopedics;  Laterality: Right;  . Orif pelvic fracture Right 09/20/2012    Procedure: OPEN REDUCTION INTERNAL FIXATION (ORIF) PELVIC FRACTURE;  Surgeon: Kerin Salen, MD;  Location: [place];  Service: Orthopedics;  Laterality: Right;  . Bone spurs removed form spine 2007  2007    Allergies: Gabapentin and Tramadol  Medications: Prior to Admission medications   Medication Sig Start Date End Date Taking? Authorizing Provider  aspirin 81 MG chewable tablet Chew 81 mg by mouth every morning.   Yes Historical Provider, MD  Calcium Carbonate-Vitamin D (CALCIUM + D PO) Take 1 tablet by mouth 2 (two) times daily.   Yes Historical Provider, MD  carbidopa-levodopa (SINEMET IR) 25-100 MG per tablet Take 0.5 tablets by mouth 3 (three) times daily.   Yes Historical Provider, MD  Cholecalciferol (VITAMIN D3) 2000 UNITS TABS Take 2,000 Units by mouth every morning.   Yes Historical Provider, MD    famotidine (PEPCID) 20 MG tablet Take 1 tablet (20 mg total) by mouth 2 (two) times daily. 02/09/14  Yes Rowe Clack, MD  fesoterodine (TOVIAZ) 8 MG TB24 tablet Take 8 mg by mouth every morning.   Yes Historical Provider, MD  furosemide (LASIX) 40 MG tablet Take 40 mg by mouth every morning.   Yes Historical Provider, MD  polyvinyl alcohol (LIQUIFILM TEARS) 1.4 % ophthalmic solution Place 1 drop into both eyes daily as needed for dry eyes.   Yes Historical Provider, MD  potassium chloride (K-DUR,KLOR-CON) 10 MEQ tablet Take 10 mEq by mouth every morning.   Yes Historical Provider, MD  pramipexole (MIRAPEX) 0.5 MG tablet Take 0.25 mg by mouth 3 (three) times daily.   Yes Historical Provider, MD  rasagiline (AZILECT) 1 MG TABS tablet Take 1 mg by mouth every morning.   Yes Historical Provider, MD  alendronate (FOSAMAX) 70 MG tablet Take 70 mg by mouth every Saturday. Take with a full glass of water on an empty stomach.    Historical Provider, MD  amoxicillin (AMOXIL) 500 MG capsule Take 2,000 mg by mouth once as needed (She takes 1 hour prior to dental procedures.).    Historical Provider, MD  diclofenac sodium (VOLTAREN) 1 % GEL Apply 2 g topically 4 (four) times daily as needed (For pain.).    Historical  Provider, MD    Family History  Problem Relation Age of Onset  . Breast cancer Maternal Grandmother   . Arthritis Maternal Grandmother   . Hypertension Other   . Osteoporosis Other   . Breast cancer Mother     had mastectomy in 1982  . Heart disease Mother   . Stroke Mother     mild, 2001  . Hypertension Mother   . Osteoporosis Mother     also had spine surgery 1987, & hip repalcement  . Arthritis Sister   . Hypertension Sister   . Parkinsonism Maternal Uncle   . Arthritis Maternal Grandfather     History   Social History  . Marital Status: Widowed    Spouse Name: N/A    Number of Children: 2  . Years of Education: 12   Occupational History  .      retired   Social  History Main Topics  . Smoking status: Never Smoker   . Smokeless tobacco: Never Used  . Alcohol Use: 1.2 oz/week    2 Glasses of wine per week     Comment: glass of wine, occas.  . Drug Use: No  . Sexual Activity: No   Other Topics Concern  . None   Social History Narrative   Patient is right handed, resides alone      Review of Systems: A 12 point ROS discussed and pertinent positives are indicated in the HPI above.  All other systems are negative.  Review of Systems  Vital Signs: BP 106/45  Pulse 90  Temp(Src) 97.5 F (36.4 C) (Oral)  Resp 16  Ht 5\' 3"  (1.6 m)  Wt 142 lb (64.411 kg)  BMI 25.16 kg/m2  SpO2 98%  Physical Exam  Constitutional: No distress.  Abdominal: She exhibits no distension. There is no tenderness.    Imaging: Ct Guide Tissue Ablation  04/21/2014   CLINICAL DATA:  1.4 x 1.7 cm tumor of the right kidney. The patient presents for cryoablation.  EXAM: CT-GUIDED PERCUTANEOUS CRYOABLATION OF RIGHT RENAL MASS  ANESTHESIA/SEDATION: General  MEDICATIONS: 2 g IV Ancef. As antibiotic prophylaxis, Ancef was ordered pre-procedure and administered intravenously within one hour of incision.  CONTRAST:  None  PROCEDURE: The procedure, risks, benefits, and alternatives were explained to the patient. Questions regarding the procedure were encouraged and answered. The patient understands and consents to the procedure.  The patient was placed under general anesthesia. Initial unenhanced CT was performed in a prone position to localize the right renal mass.  The patient was prepped with Betadine in a sterile fashion, and a sterile drape was applied covering the operative field. A sterile gown and sterile gloves were used for the procedure. A time-out procedure was performed.  Under CT guidance, a 2.4 mm [employer] percutaneous cryoablation probe was advanced into the right renal mass. Probe positioning was confirmed by CT prior to cryoablation. Hydrodissection was then  performed with instillation of approximately 90 mL of sterile saline through a 22 gauge spinal needle positioned between the kidney and liver.  Cryoablation was performed through the single probe. Initial 10 minute cycle of cryoablation was performed. This was followed by a 6 minute thaw cycle. A second 10 minute cycle of cryoablation was then performed with reduced power. During ablation, periodic CT imaging was performed to monitor ice ball formation and morphology. After active thaw, the cryoablation probe was removed.  During the procedure, a 5 [place] Yueh centesis catheter was advanced into the posterior right pleural space. Aspiration  of air was performed through the catheter periodically during the procedure.  COMPLICATIONS: Small right basilar pneumothorax.  FINDINGS: The partially exophytic mass emanating from the lateral right kidney was localized and abuts the liver capsule. After placing a single cryoablation probe, fluid was instilled between the liver capsule and lesion to prevent significant extension of cryoablation ice ball into the liver. Hydrodissection was successful in separating the kidney and liver.  During the procedure, a small posterior right pneumothorax was sustained, likely at the time of positioning spinal needle placement through the posterior pleural space. During the procedure, aspiration of pleural air was performed through a centesis catheter which was successful in completely decompressing the pneumothorax. By the end of the procedure, there was no evidence of continued air leak into the pleural space.  CT performed at the time of cryoablation shows adequate ice ball formation encompassing the lesion. There were no immediate complications.  IMPRESSION: CT guided percutaneous core cryoablation of right renal mass. The procedure was complicated by a small right posterior pneumothorax which was treated by catheter suction aspiration. A chest x-ray will be obtained 2 hours following  the procedure and also tomorrow morning. The patient will be observed overnight. Initial follow-up will be performed in approximately 4 weeks.   Electronically Signed   By: Aletta Edouard M.D.   On: 04/21/2014 14:12   Dg Chest Port 1 View  04/21/2014   CLINICAL DATA:  Small pneumothorax after a renal cryoablation  EXAM: PORTABLE CHEST - 1 VIEW  COMPARISON:  CT images during biopsy dated 04/21/2014  FINDINGS: No definite pneumothorax is evident on this semi-erect portable chest x-ray. The lungs are clear. No effusion is seen. Minimal left basilar linear atelectasis or scarring is present. There is cardiomegaly noted. The bones appear osteopenic.  IMPRESSION: No definite pneumothorax by portable semi-erect chest x-ray. Mild linear atelectasis or scarring at the left lung base.   Electronically Signed   By: Ivar Drape M.D.   On: 04/21/2014 11:54    Labs: Lab Results  Component Value Date   WBC 6.9 04/18/2014   HCT 36.5 04/18/2014   MCV 93.4 04/18/2014   PLT 189 04/18/2014   NA 141 04/21/2014   K 4.1 04/21/2014   CL 104 04/21/2014   CO2 25 04/21/2014   GLUCOSE 87 04/21/2014   BUN 40* 04/21/2014   CREATININE 1.19* 04/21/2014   CALCIUM 8.7 04/21/2014   PROT 7.0 12/03/2012   ALBUMIN 3.9 12/03/2012   AST 31 12/03/2012   ALT 22 12/03/2012   ALKPHOS 211* 12/03/2012   BILITOT 0.5 12/03/2012   GFRNONAA 43* 04/21/2014   GFRAA 50* 04/21/2014   INR 0.94 04/18/2014    Assessment and Plan:  Doing well after cryoablation of right renal mass.  No hematuria or pain.  Check labs in AM.  Anticipate discharge tomorrow.   Venetia Night. Kathlene Cote, M.D. Pager:  [phone]

## 2014-04-21 NOTE — Transfer of Care (Signed)
Immediate Anesthesia Transfer of Care Note  Patient: Dawn Ashley  Procedure(s) Performed: Procedure(s): RIGHT RENAL CRYOABLATION (Right)  Patient Location: PACU  Anesthesia Type:General  Level of Consciousness: awake, alert , oriented and patient cooperative  Airway & Oxygen Therapy: Patient Spontanous Breathing and Patient connected to face mask oxygen  Post-op Assessment: Report given to PACU RN and Post -op Vital signs reviewed and stable  Post vital signs: Reviewed and stable  Complications: No apparent anesthesia complications

## 2014-04-21 NOTE — H&P (Signed)
Chief Complaint: Right renal mass  Referring Physician(s): Dr. Alan Ripper  History of Present Illness: Dawn Ashley is a 78 y.o. female who had a workup for microscopic  hematuria with periodic left lower quadrant pain recently. CT on  02/13/14 demonstrated a small enhancing exophytic mass of the lateral  right interpolar cortex measuring approximately 1.4 x 1.7 cm. No  renal vein involvement or local metastatic disease was detected by  CT. Two separate nonobstructing renal calculi were seen in the left  kidney. The patient currently denies any abdominal or flank pain.  She has never had gross hematuria. She has no history of malignancy  or prior renal surgery. Following IR consultation she presents today for elective CT guided biopsy/percutaneous cryoablation of the right renal mass.   Past Medical History  Diagnosis Date  . Parkinson disease 01/2005 dx  . Back pain   . Kidney stones   . Heart murmur     h/o MVP, pt. reports murmur is almost resolved  . Arthritis     osteoporosis, osteoarthritis   . Swelling     legs / feet - takes Lasix to control  . GERD (gastroesophageal reflux disease)   . Renal mass, right   . Frequency of urination   . MVP (mitral valve prolapse)     Past Surgical History  Procedure Laterality Date  . Tonsillectomy    . Cataract extraction w/ intraocular lens  implant, bilateral  2011  2004  . Dilation and curettage of uterus  2012  . Back surgery  2009    spurs resting on nerve, lumbar  . Total hip arthroplasty Right 09/20/2012    Procedure: TOTAL HIP ARTHROPLASTY;  Surgeon: Kerin Salen, MD;  Location: [facility];  Service: Orthopedics;  Laterality: Right;  . Orif pelvic fracture Right 09/20/2012    Procedure: OPEN REDUCTION INTERNAL FIXATION (ORIF) PELVIC FRACTURE;  Surgeon: Kerin Salen, MD;  Location: [facility];  Service: Orthopedics;  Laterality: Right;  . Bone spurs removed form spine 2007  2007    Allergies: Gabapentin and  Tramadol  Medications: Prior to Admission medications   Medication Sig Start Date End Date Taking? Authorizing Provider  aspirin 81 MG chewable tablet Chew 81 mg by mouth every morning.   Yes Historical Provider, MD  Calcium Carbonate-Vitamin D (CALCIUM + D PO) Take 1 tablet by mouth 2 (two) times daily.   Yes Historical Provider, MD  carbidopa-levodopa (SINEMET IR) 25-100 MG per tablet Take 0.5 tablets by mouth 3 (three) times daily.   Yes Historical Provider, MD  Cholecalciferol (VITAMIN D3) 2000 UNITS TABS Take 2,000 Units by mouth every morning.   Yes Historical Provider, MD  famotidine (PEPCID) 20 MG tablet Take 1 tablet (20 mg total) by mouth 2 (two) times daily. 02/09/14  Yes Rowe Clack, MD  fesoterodine (TOVIAZ) 8 MG TB24 tablet Take 8 mg by mouth every morning.   Yes Historical Provider, MD  furosemide (LASIX) 40 MG tablet Take 40 mg by mouth every morning.   Yes Historical Provider, MD  polyvinyl alcohol (LIQUIFILM TEARS) 1.4 % ophthalmic solution Place 1 drop into both eyes daily as needed for dry eyes.   Yes Historical Provider, MD  potassium chloride (K-DUR,KLOR-CON) 10 MEQ tablet Take 10 mEq by mouth every morning.   Yes Historical Provider, MD  pramipexole (MIRAPEX) 0.5 MG tablet Take 0.25 mg by mouth 3 (three) times daily.   Yes Historical Provider, MD  rasagiline (AZILECT) 1 MG TABS tablet  Take 1 mg by mouth every morning.   Yes Historical Provider, MD  alendronate (FOSAMAX) 70 MG tablet Take 70 mg by mouth every Saturday. Take with a full glass of water on an empty stomach.    Historical Provider, MD  amoxicillin (AMOXIL) 500 MG capsule Take 2,000 mg by mouth once as needed (She takes 1 hour prior to dental procedures.).    Historical Provider, MD  diclofenac sodium (VOLTAREN) 1 % GEL Apply 2 g topically 4 (four) times daily as needed (For pain.).    Historical Provider, MD    Family History  Problem Relation Age of Onset  . Breast cancer Maternal Grandmother   .  Arthritis Maternal Grandmother   . Hypertension Other   . Osteoporosis Other   . Breast cancer Mother     had mastectomy in 1982  . Heart disease Mother   . Stroke Mother     mild, 2001  . Hypertension Mother   . Osteoporosis Mother     also had spine surgery 1987, & hip repalcement  . Arthritis Sister   . Hypertension Sister   . Parkinsonism Maternal Uncle   . Arthritis Maternal Grandfather     History   Social History  . Marital Status: Widowed    Spouse Name: N/A    Number of Children: 2  . Years of Education: 12   Occupational History  .      retired   Social History Main Topics  . Smoking status: Never Smoker   . Smokeless tobacco: Never Used  . Alcohol Use: 1.2 oz/week    2 Glasses of wine per week     Comment: glass of wine, occas.  . Drug Use: No  . Sexual Activity: None   Other Topics Concern  . None   Social History Narrative   Patient is right handed, resides alone         Review of Systems  Constitutional: Negative for fever and chills.  Respiratory: Negative for cough and shortness of breath.   Cardiovascular: Negative for chest pain.  Gastrointestinal: Negative for nausea, vomiting, abdominal pain and blood in stool.  Genitourinary: Positive for urgency. Negative for dysuria and hematuria.       Some urinary incontinence  Musculoskeletal: Positive for back pain.  Neurological: Positive for tremors. Negative for headaches.       Uses walker to assist with ambulation; some balance/coordination difficulties secondary to known Parkinson's disease  Hematological: Does not bruise/bleed easily.    Vital Signs: BP 145/66  Pulse 86  Temp(Src) 97.8 F (36.6 C) (Oral)  Resp 16  SpO2 100%  Physical Exam  Constitutional: She is oriented to person, place, and time. She appears well-developed and well-nourished.  Cardiovascular: Normal rate and regular rhythm.   Pulmonary/Chest: Effort normal.  Few fine rt basilar crackles, left clear   Abdominal: Soft. Bowel sounds are normal. There is no tenderness.  Musculoskeletal:  Trace LE edema; uses walker to assist with ambulation  Neurological: She is alert and oriented to person, place, and time.    Imaging: No results found.  Labs: Lab Results  Component Value Date   WBC 6.9 04/18/2014   HCT 36.5 04/18/2014   MCV 93.4 04/18/2014   PLT 189 04/18/2014   NA 141 04/21/2014   K 4.1 04/21/2014   CL 104 04/21/2014   CO2 25 04/21/2014   GLUCOSE 87 04/21/2014   BUN 40* 04/21/2014   CREATININE 1.19* 04/21/2014   CALCIUM 8.7 04/21/2014  PROT 7.0 12/03/2012   ALBUMIN 3.9 12/03/2012   AST 31 12/03/2012   ALT 22 12/03/2012   ALKPHOS 211* 12/03/2012   BILITOT 0.5 12/03/2012   GFRNONAA 43* 04/21/2014   GFRAA 50* 04/21/2014   INR 0.94 04/18/2014    Assessment and Plan: Dawn Ashley is a 78 y.o. female who had a workup for microscopic hematuria with periodic left lower quadrant pain recently. CT on 02/13/14 demonstrated a small enhancingexophytic mass of the lateral right interpolar cortex measuring approximately 1.4 x 1.7 cm. No renal vein involvement or local metastatic disease was detected by CT. Two separate nonobstructing renal calculi were seen in the left  kidney. The patient currently denies any abdominal or flank pain.  She has never had gross hematuria. She has no history of malignancy or prior renal surgery. Following IR consultation she presents today for elective CT guided biopsy/percutaneous cryoablation of the right renal mass. Details/risks of procedure d/w pt/son with their understanding and consent. Following procedure the pt will be admitted for overnight observation.   Rowe Robert, PAC

## 2014-04-22 ENCOUNTER — Observation Stay (HOSPITAL_COMMUNITY): Payer: Medicare Other

## 2014-04-22 DIAGNOSIS — N289 Disorder of kidney and ureter, unspecified: Secondary | ICD-10-CM | POA: Diagnosis not present

## 2014-04-22 LAB — BASIC METABOLIC PANEL
Anion gap: 10 (ref 5–15)
BUN: 27 mg/dL — ABNORMAL HIGH (ref 6–23)
CHLORIDE: 109 meq/L (ref 96–112)
CO2: 22 mEq/L (ref 19–32)
Calcium: 8.1 mg/dL — ABNORMAL LOW (ref 8.4–10.5)
Creatinine, Ser: 0.97 mg/dL (ref 0.50–1.10)
GFR calc Af Amer: 64 mL/min — ABNORMAL LOW (ref >90)
GFR calc non Af Amer: 55 mL/min — ABNORMAL LOW (ref >90)
GLUCOSE: 105 mg/dL — AB (ref 70–99)
POTASSIUM: 4.3 meq/L (ref 3.7–5.3)
SODIUM: 141 meq/L (ref 137–147)

## 2014-04-22 LAB — CBC
HEMATOCRIT: 29.6 % — AB (ref 36.0–46.0)
Hemoglobin: 9.5 g/dL — ABNORMAL LOW (ref 12.0–15.0)
MCH: 29.9 pg (ref 26.0–34.0)
MCHC: 32.1 g/dL (ref 30.0–36.0)
MCV: 93.1 fL (ref 78.0–100.0)
Platelets: 141 10*3/uL — ABNORMAL LOW (ref 150–400)
RBC: 3.18 MIL/uL — ABNORMAL LOW (ref 3.87–5.11)
RDW: 14.7 % (ref 11.5–15.5)
WBC: 7.6 10*3/uL (ref 4.0–10.5)

## 2014-04-22 NOTE — Discharge Summary (Signed)
Physician Discharge Summary  Patient ID: Dawn Ashley MRN: [id number] DOB/AGE: 76-Sep-1937 76 y.o.  Admit date: 04/21/2014 Discharge date: 04/22/2014  Admission Diagnoses: Right renal mass  Discharge Diagnoses: Treatment of rt renal mass; cryoablation  Active problems: Right renal mass Parkinson's disease HTN GERD   Discharged Condition: stable  Hospital Course: Pt had been seen by PMD regarding intermittent LLQ pain and found to have microscopic hematuria.  CT 02/2014 revealed small R renal mass. Consulted with Dr Kathlene Cote about cryoablation and biopsy.  Cryoablation was performed 04/21/14 in IR.  Pt did well with procedure.  No biopsy was performed- no safe window per Dr Kathlene Cote.  Procedure complicated by very small pneumothorax during; but was evacuated during procedure.  CXR this am is read without PTX and pt is asymptomatic. She has done well; slept well; eating well. Urinating without problem- clear yellow. Passing gas. Denies SOB or pain. Ambulating in room with walker as is normal for her. I have seen and examined pt. Have discussed with Dr Pascal Lux- aware of pt status. Plan for discharge now.  Consults: None  Significant Diagnostic Studies: CT Abdomen  Treatments: CT guided Rt renal mass cryoablation  Discharge Exam: Blood pressure 129/60, pulse 61, temperature 98.4 F (36.9 C), temperature source Oral, resp. rate 14, height 5\' 3"  (1.6 m), weight 64.411 kg (142 lb), SpO2 99.00%.  PE:   Afeb; VSS Pt up in chair; alert/oriented Dressed; pleasant No complaints; NAD Heart: RRR Lungs: CTA Abd: soft; +BS; NT Rt flank procedure site: NT; no bleeding No hematoma Urine clear yellow UOP good Gait steady; uses walker always CXR this am: No PTX  Results for orders placed during the hospital encounter of 40/98/11  BASIC METABOLIC PANEL      Result Value Ref Range   Sodium 141  137 - 147 mEq/L   Potassium 4.1  3.7 - 5.3 mEq/L   Chloride 104  96 - 112 mEq/L   CO2 25  19  - 32 mEq/L   Glucose, Bld 87  70 - 99 mg/dL   BUN 40 (*) 6 - 23 mg/dL   Creatinine, Ser 1.19 (*) 0.50 - 1.10 mg/dL   Calcium 8.7  8.4 - 10.5 mg/dL   GFR calc non Af Amer 43 (*) >90 mL/min   GFR calc Af Amer 50 (*) >90 mL/min   Anion gap 12  5 - 15  CBC      Result Value Ref Range   WBC 7.6  4.0 - 10.5 K/uL   RBC 3.18 (*) 3.87 - 5.11 MIL/uL   Hemoglobin 9.5 (*) 12.0 - 15.0 g/dL   HCT 29.6 (*) 36.0 - 46.0 %   MCV 93.1  78.0 - 100.0 fL   MCH 29.9  26.0 - 34.0 pg   MCHC 32.1  30.0 - 36.0 g/dL   RDW 14.7  11.5 - 15.5 %   Platelets 141 (*) 150 - 400 K/uL  BASIC METABOLIC PANEL      Result Value Ref Range   Sodium 141  137 - 147 mEq/L   Potassium 4.3  3.7 - 5.3 mEq/L   Chloride 109  96 - 112 mEq/L   CO2 22  19 - 32 mEq/L   Glucose, Bld 105 (*) 70 - 99 mg/dL   BUN 27 (*) 6 - 23 mg/dL   Creatinine, Ser 0.97  0.50 - 1.10 mg/dL   Calcium 8.1 (*) 8.4 - 10.5 mg/dL   GFR calc non Af Amer 55 (*) >90 mL/min  GFR calc Af Amer 64 (*) >90 mL/min   Anion gap 10  5 - 15   Disposition: DC to home Pt has done well overnight Continue all home meds Follow up with Dr Nilda Calamity will call pt with time and date Dr Pascal Lux aware of pt status- Have discussed with him findings I have seen and examined pt. Pt has good understanding of discharge instructions  Discharge Instructions   Call MD for:  difficulty breathing, headache or visual disturbances    Complete by:  As directed      Call MD for:  extreme fatigue    Complete by:  As directed      Call MD for:  hives    Complete by:  As directed      Call MD for:  persistant dizziness or light-headedness    Complete by:  As directed      Call MD for:  persistant nausea and vomiting    Complete by:  As directed      Call MD for:  redness, tenderness, or signs of infection (pain, swelling, redness, odor or green/yellow discharge around incision site)    Complete by:  As directed      Call MD for:  severe uncontrolled pain    Complete by:  As  directed      Call MD for:  temperature >100.4    Complete by:  As directed      Diet - low sodium heart healthy    Complete by:  As directed      Discharge instructions    Complete by:  As directed   Dc to home; follow up with Dr Nilda Calamity will call pt with time and date; continue all home meds     Discharge wound care:    Complete by:  As directed   Keep band aid to Rt flank site x 1 week     Driving Restrictions    Complete by:  As directed   No driving x 1 week     Increase activity slowly    Complete by:  As directed      Lifting restrictions    Complete by:  As directed   No lifting over 10 lbs x 1 week     Other Restrictions    Complete by:  As directed   May shower/bathe today            Medication List         alendronate 70 MG tablet  Commonly known as:  FOSAMAX  Take 70 mg by mouth every Saturday. Take with a full glass of water on an empty stomach.     amoxicillin 500 MG capsule  Commonly known as:  AMOXIL  Take 2,000 mg by mouth once as needed (She takes 1 hour prior to dental procedures.).     aspirin 81 MG chewable tablet  Chew 81 mg by mouth every morning.     AZILECT 1 MG Tabs tablet  Generic drug:  rasagiline  Take 1 mg by mouth every morning.     CALCIUM + D PO  Take 1 tablet by mouth 2 (two) times daily.     carbidopa-levodopa 25-100 MG per tablet  Commonly known as:  SINEMET IR  Take 0.5 tablets by mouth 3 (three) times daily.     diclofenac sodium 1 % Gel  Commonly known as:  VOLTAREN  Apply 2 g topically 4 (four) times daily as needed (For pain.).  famotidine 20 MG tablet  Commonly known as:  PEPCID  Take 1 tablet (20 mg total) by mouth 2 (two) times daily.     furosemide 40 MG tablet  Commonly known as:  LASIX  Take 40 mg by mouth every morning.     polyvinyl alcohol 1.4 % ophthalmic solution  Commonly known as:  LIQUIFILM TEARS  Place 1 drop into both eyes daily as needed for dry eyes.     potassium chloride 10  MEQ tablet  Commonly known as:  K-DUR,KLOR-CON  Take 10 mEq by mouth every morning.     pramipexole 0.5 MG tablet  Commonly known as:  MIRAPEX  Take 0.25 mg by mouth 3 (three) times daily.     TOVIAZ 8 MG Tb24 tablet  Generic drug:  fesoterodine  Take 8 mg by mouth every morning.     Vitamin D3 2000 UNITS Tabs  Take 2,000 Units by mouth every morning.         Signed: Iviana Blasingame A 04/22/2014, 8:28 AM

## 2014-04-22 NOTE — Progress Notes (Signed)
UR completed 

## 2014-04-24 ENCOUNTER — Encounter: Payer: Self-pay | Admitting: Internal Medicine

## 2014-04-26 NOTE — Discharge Summary (Signed)
Agree.  Follow up in clinic in 4 weeks after CT.

## 2014-05-04 HISTORY — PX: KIDNEY SURGERY: SHX687

## 2014-05-09 ENCOUNTER — Other Ambulatory Visit: Payer: Self-pay | Admitting: Internal Medicine

## 2014-05-17 ENCOUNTER — Other Ambulatory Visit (HOSPITAL_COMMUNITY): Payer: Self-pay | Admitting: Interventional Radiology

## 2014-05-17 DIAGNOSIS — N2889 Other specified disorders of kidney and ureter: Secondary | ICD-10-CM

## 2014-05-18 ENCOUNTER — Other Ambulatory Visit: Payer: Self-pay | Admitting: Emergency Medicine

## 2014-05-18 DIAGNOSIS — N2889 Other specified disorders of kidney and ureter: Secondary | ICD-10-CM

## 2014-05-25 LAB — CREATININE WITH EST GFR
Creat: 1 mg/dL (ref 0.50–1.10)
GFR, Est African American: 63 mL/min
GFR, Est Non African American: 55 mL/min — ABNORMAL LOW

## 2014-05-25 LAB — BUN: BUN: 30 mg/dL — AB (ref 6–23)

## 2014-06-01 ENCOUNTER — Inpatient Hospital Stay: Admission: RE | Admit: 2014-06-01 | Payer: Medicare Other | Source: Ambulatory Visit

## 2014-06-01 ENCOUNTER — Other Ambulatory Visit: Payer: Self-pay

## 2014-06-01 ENCOUNTER — Ambulatory Visit (HOSPITAL_COMMUNITY): Payer: Medicare Other

## 2014-06-01 DIAGNOSIS — Z1231 Encounter for screening mammogram for malignant neoplasm of breast: Secondary | ICD-10-CM

## 2014-06-14 ENCOUNTER — Other Ambulatory Visit: Payer: Medicare Other

## 2014-06-14 ENCOUNTER — Other Ambulatory Visit (HOSPITAL_COMMUNITY): Payer: Medicare Other

## 2014-06-19 ENCOUNTER — Other Ambulatory Visit: Payer: Self-pay | Admitting: Emergency Medicine

## 2014-06-19 ENCOUNTER — Ambulatory Visit: Payer: Medicare Other | Admitting: Internal Medicine

## 2014-06-19 ENCOUNTER — Ambulatory Visit: Payer: Medicare Other | Admitting: Nurse Practitioner

## 2014-06-19 DIAGNOSIS — N2889 Other specified disorders of kidney and ureter: Secondary | ICD-10-CM

## 2014-06-23 ENCOUNTER — Ambulatory Visit: Payer: Medicare Other | Admitting: Nurse Practitioner

## 2014-06-23 ENCOUNTER — Ambulatory Visit
Admission: RE | Admit: 2014-06-23 | Discharge: 2014-06-23 | Disposition: A | Discharge status: Home / Self Care | Payer: Medicare Other | Source: Ambulatory Visit

## 2014-06-23 DIAGNOSIS — Z1231 Encounter for screening mammogram for malignant neoplasm of breast: Secondary | ICD-10-CM

## 2014-06-26 ENCOUNTER — Ambulatory Visit: Payer: Medicare Other | Admitting: Neurology

## 2014-06-26 ENCOUNTER — Telehealth: Payer: Self-pay | Admitting: Neurology

## 2014-06-26 NOTE — Telephone Encounter (Signed)
Called patient to rescheduled appt from 06/26/14, doctor not in office.

## 2014-07-05 ENCOUNTER — Telehealth: Payer: Self-pay | Admitting: Emergency Medicine

## 2014-07-05 NOTE — Telephone Encounter (Signed)
LMOVM TO PUSH PT OUT F/U APPT TO JAN. 2016 DUE TO HER INSURANCE NOT APPROVING CT YET.

## 2014-07-07 ENCOUNTER — Encounter: Payer: Self-pay | Admitting: Neurology

## 2014-07-07 ENCOUNTER — Ambulatory Visit (INDEPENDENT_AMBULATORY_CARE_PROVIDER_SITE_OTHER): Payer: Medicare Other | Admitting: Neurology

## 2014-07-07 ENCOUNTER — Telehealth: Payer: Self-pay | Admitting: Internal Medicine

## 2014-07-07 VITALS — BP 163/68 | HR 77 | Temp 97.3°F | Ht 60.5 in | Wt 143.0 lb

## 2014-07-07 DIAGNOSIS — M545 Low back pain, unspecified: Secondary | ICD-10-CM

## 2014-07-07 DIAGNOSIS — Z0289 Encounter for other administrative examinations: Secondary | ICD-10-CM

## 2014-07-07 DIAGNOSIS — R600 Localized edema: Secondary | ICD-10-CM

## 2014-07-07 DIAGNOSIS — Z96649 Presence of unspecified artificial hip joint: Secondary | ICD-10-CM

## 2014-07-07 DIAGNOSIS — Z9660 Presence of unspecified orthopedic joint implant: Secondary | ICD-10-CM

## 2014-07-07 DIAGNOSIS — M4120 Other idiopathic scoliosis, site unspecified: Secondary | ICD-10-CM

## 2014-07-07 DIAGNOSIS — M25551 Pain in right hip: Secondary | ICD-10-CM

## 2014-07-07 DIAGNOSIS — G20 Parkinson's disease: Secondary | ICD-10-CM

## 2014-07-07 MED ORDER — CARBIDOPA-LEVODOPA 25-100 MG PO TABS: 1.0000 | ORAL_TABLET | Freq: Three times a day (TID) | ORAL | Status: DC

## 2014-07-07 NOTE — Progress Notes (Signed)
Subjective:    Patient ID: Dawn Ashley is a 78 y.o. female.  HPI     Interim history:   Dawn Ashley is a 78 year-old [place] lady, with an underlying medical history of carotid artery disease, lumbar spine disease with multilevel degenerative joint disease and hypertrophic facet arthritis, s/p back surgery in 2008, cataract surgery and R THR on 09/20/2012, who presents for followup consultation of her L sided predominant Parkinson's disease, which was diagnosed in 2006 with symptoms dating back to a few years prior. The patient is unaccompanied today. I last saw her on 03/20/2014, at which time she reported no significant exacerbation of symptoms. She had stopped gabapentin as she felt too sleepy with it. She was in the home health physical therapy at the time. At the time of her last visit I asked her to reduce Mirapex because of lower extremity swelling noted. I introduced a small dose of Sinemet starting with half a pill twice daily with increase to half a pill 3 times a day.  Today, she reports doing well, she has not noted andy SEs with the C/L. She is doing PT one on one at [facility]. She has a reasonable appetite. Mood and memory are stable. She will be spending her holidays with her 2 sons, one is in [place], one in [place], both have 2 children each. She has not fallen recently. She uses her walker or cane. She had a recent lower back injection and has a FU in 10 days for it. She feels her daytime sleepiness is a little better she is on a bladder medication. She has some dry mouth. She is trying to drink enough fluid. She sometimes does not have a full lunch and only eat some crackers. She no longer drives.  I saw her on 09/19/2013, at which time she reported having reduced her gabapentin. She was started on Gabapentin in 11/13, by her sports medicine doctor and was up to 300 mg tid, but has had sleepiness and blurry vision with it. She was scheduled for a back injection on 10/06/13 under  Dr. Mina Marble. Dr. Mayer Camel is her orthopedic doctor. She had started PT at [facility], 3 times a week. She felt stable with her PD Sx, and has never been on C/L. I kept her on Mirapex and Azilect. In March she called back reporting swelling and redness in her toes. I suggested she have it checked out by her primary care physician as it may indicate several issues such as with circulation, venous stasis, or cellulitis. She has been seeing her primary care physician for lower extremity swelling.  I saw her on 03/08/2013, at which time I felt she was quite stable and that she had recovered well from her recent total hip replacement surgery. She was complaining of daytime somnolence and I suggested changing her Mirapex immediate release to long-acting. However, she called back a few days later reporting that the long-acting Mirapex was not covered by her insurance and we changed her back to immediate release Mirapex.   I first met her on 12/06/12 at which time I did not change her medications. She previously followed with Dr. Morene Antu and was last seen by him on 09/08/2012, which time he felt that her sleepiness was in part d/t tramadol and stopped it. He also obtained blood work including CBC, CMP, TSH and urinalysis as well as B12 level and ordered a brain MRI. She had problems with her gait which he primarily attributed to right  hip pain.   Her blood work showed mild elevated alkaline phosphatase and BUN of 28 and creatinine of 1.17. Her son was called back and advised that she should increase her fluid intake. Her urinalysis was not consistent with clear-cut UTI. Since her last visit with Dr. Erling Cruz she had right hip replacement surgery on 09/20/12. She has been in rehabilitation. She did not end up getting her brain scan because she had surgery soon after her last visit and was in rehabilitation since then. Her sleepiness and confusion improved after her pain medication was changed from tramadol to diclofenac. She  moved to [place]. She had developed some LE swelling and was switched from HCTZ to Lasix and advised to reduce her NSAIDs.   Her Past Medical History Is Significant For: Past Medical History  Diagnosis Date  . Parkinson disease 01/2005 dx  . Back pain   . Kidney stones   . Heart murmur     h/o MVP, pt. reports murmur is almost resolved  . Arthritis     osteoporosis, osteoarthritis   . Swelling     legs / feet - takes Lasix to control  . GERD (gastroesophageal reflux disease)   . Renal mass, right   . Frequency of urination   . MVP (mitral valve prolapse)     Her Past Surgical History Is Significant For: Past Surgical History  Procedure Laterality Date  . Tonsillectomy    . Cataract extraction w/ intraocular lens  implant, bilateral  2011  2004  . Dilation and curettage of uterus  2012  . Back surgery  2009    spurs resting on nerve, lumbar  . Total hip arthroplasty Right 09/20/2012    Procedure: TOTAL HIP ARTHROPLASTY;  Surgeon: Kerin Salen, MD;  Location: [place];  Service: Orthopedics;  Laterality: Right;  . Orif pelvic fracture Right 09/20/2012    Procedure: OPEN REDUCTION INTERNAL FIXATION (ORIF) PELVIC FRACTURE;  Surgeon: Kerin Salen, MD;  Location: [place];  Service: Orthopedics;  Laterality: Right;  . Bone spurs removed form spine 2007  2007  . Kidney surgery  05/2014    growth removed    Her Family History Is Significant For: Family History  Problem Relation Age of Onset  . Breast cancer Maternal Grandmother   . Arthritis Maternal Grandmother   . Hypertension Other   . Osteoporosis Other   . Breast cancer Mother     had mastectomy in 1982  . Heart disease Mother   . Stroke Mother     mild, 2001  . Hypertension Mother   . Osteoporosis Mother     also had spine surgery 1987, & hip repalcement  . Arthritis Sister   . Hypertension Sister   . Parkinsonism Maternal Uncle   . Arthritis Maternal Grandfather     Her Social History Is  Significant For: History   Social History  . Marital Status: Widowed    Spouse Name: N/A    Number of Children: 2  . Years of Education: 12   Occupational History  .      retired   Social History Main Topics  . Smoking status: Never Smoker   . Smokeless tobacco: Never Used  . Alcohol Use: 1.2 oz/week    2 Glasses of wine per week     Comment: glass of wine, occas.  . Drug Use: No  . Sexual Activity: No   Other Topics Concern  . None   Social History Narrative  Patient is right handed, resides alone    Her Allergies Are:  Allergies  Allergen Reactions  . Gabapentin Other (See Comments)    sleepy  . Tramadol Other (See Comments)    Confusion and sleepy  :   Her Current Medications Are:  Outpatient Encounter Prescriptions as of 07/07/2014  Medication Sig  . Acetaminophen (TYLENOL PO) Take by mouth daily. 1 tablet daily  . alendronate (FOSAMAX) 70 MG tablet Take 70 mg by mouth every Saturday. Take with a full glass of water on an empty stomach.  Marland Kitchen amoxicillin (AMOXIL) 500 MG capsule Take 2,000 mg by mouth once as needed (She takes 1 hour prior to dental procedures.).  Marland Kitchen aspirin 81 MG chewable tablet Chew 81 mg by mouth every morning.  . Calcium Carbonate-Vitamin D (CALCIUM + D PO) Take 1 tablet by mouth 2 (two) times daily.  . carbidopa-levodopa (SINEMET IR) 25-100 MG per tablet Take 0.5 tablets by mouth 3 (three) times daily.  . Cholecalciferol (VITAMIN D3) 2000 UNITS TABS Take 2,000 Units by mouth every morning.  . diclofenac sodium (VOLTAREN) 1 % GEL Apply 2 g topically 4 (four) times daily as needed (For pain.).  Marland Kitchen famotidine (PEPCID) 20 MG tablet Take 1 tablet (20 mg total) by mouth 2 (two) times daily.  . fesoterodine (TOVIAZ) 8 MG TB24 tablet Take 8 mg by mouth every morning.  Marland Kitchen FLUZONE HIGH-DOSE 0.5 ML SUSY   . furosemide (LASIX) 40 MG tablet Take 40 mg by mouth every morning.  Marland Kitchen KLOR-CON M10 10 MEQ tablet TAKE 1 TABLET DAILY  . meloxicam (MOBIC) 15 MG tablet  2 (two) times daily.   . polyvinyl alcohol (LIQUIFILM TEARS) 1.4 % ophthalmic solution Place 1 drop into both eyes daily as needed for dry eyes.  . potassium chloride (K-DUR,KLOR-CON) 10 MEQ tablet Take 10 mEq by mouth every morning.  . pramipexole (MIRAPEX) 0.5 MG tablet Take 0.25 mg by mouth 3 (three) times daily.  . rasagiline (AZILECT) 1 MG TABS tablet Take 1 mg by mouth every morning.  :  Review of Systems:  Out of a complete 14 point review of systems, all are reviewed and negative with the exception of these symptoms as listed below:   Review of Systems  Endocrine:       Excessive thirst  Genitourinary:       Incontinence of bladder    Objective:  Neurologic Exam  Physical Exam Physical Examination:   Filed Vitals:   07/07/14 0957  BP: 163/68  Pulse: 77  Temp: 97.3 F (36.3 C)    General Examination: The patient is a very pleasant 78 y.o. female in no acute distress.  HEENT: Normocephalic, atraumatic, pupils are equal, round and reactive to light and accommodation. Extraocular tracking shows mild saccadic breakdown without nystagmus noted. Funduscopy is normal. There is no limitation to her gaze. There is mild decrease in eye blink rate. Hearing is intact. Face is symmetric with mild facial masking and normal facial sensation. There is no lip, neck or jaw tremor. Neck is mildly rigid with intact passive ROM. There are no carotid bruits on auscultation. Oropharynx exam reveals moderate mouth dryness. No significant airway crowding is noted. Mallampati is class II. Tongue protrudes centrally and palate elevates symmetrically. There is no drooling.   Chest: is clear to auscultation without wheezing, rhonchi or crackles noted.  Heart: sounds are regular and normal without murmurs, rubs or gallops noted.   Abdomen: is soft, non-tender and non-distended with normal bowel sounds appreciated  on auscultation.  Extremities: There is less edema today in the distal lower  extremities bilaterally, and she is not wearing her compression stockings today.    Skin: is warm and dry with no trophic changes noted.  Musculoskeletal: exam reveals: less R hip pain and R LBP, and she had a recent ESI into the lower back. She has some L knee pain, but R knee is more swollen today.   Neurologically:  Mental status: The patient is awake and alert, paying good  attention. She is able to completely provide the history. She is oriented to: person, place, situation, day of week, month of year and year. Her memory, attention, language and knowledge are intact. There is no aphasia, agnosia, apraxia or anomia. There is a no bradyphrenia. Speech is mildly hypophonic with no dysarthria noted. Mood is congruent and affect is normal.   Cranial nerves are as described above under HEENT exam. In addition, shoulder shrug is normal with equal shoulder height noted.  Motor exam: Normal bulk, and strength for age is noted. There are no dyskinesias noted. Tone is mildly rigid with absence of cogwheeling in the bilateral extremities. There is overall mild bradykinesia. There is no drift or rebound. There is no tremor. Romberg is negative. She brought her 2 wheeled walker. Reflexes are 1+ in the upper extremities and 1+ in the lower extremities. Fine motor skills exam reveals: Finger taps are mildly impaired on the right and mildly impaired on the left. Hand movements are mildly impaired on the right and mildly impaired on the left. RAP (rapid alternating patting) is mildly impaired on the right and moderately impaired on the left. Foot taps are mildly impaired on the right and moderately impaired on the left. Foot agility (in the form of heel stomping) is mildly impaired on the right and mildly impaired on the left.    Cerebellar testing shows no dysmetria or intention tremor on finger to nose testing. Heel to shin is not checked. There is no truncal or gait ataxia.   Sensory exam is intact to light  touch, PP, temperature sense and vibration sense in the UEs and LEs.   Gait, station and balance exam: She stands up with moderate difficulty, has to push herself up and has a moderately stooped posture with kyphoscoliosis and leans to the L. She uses her 2 wheeled walker well. She has a slight limp on the R. She turns in 3 steps and tends to turn a little fast.   Assessment and Plan:   In summary, KIYOKO MCGUIRT is a very pleasant 78 year old female with a history of left-sided predominant Parkinson's disease diagnosed in 2006 but with symptoms dating back to a few years prior to that. She has remained fairly stable and her lower extremity swelling is a little better. She has been able to tolerate Sinemet. She is on half a pill 3 times a day as she recalls. Today I would like to suggest that we phase out of Mirapex altogether. To that end I would like for her to reduce it to half a pill twice daily for a week, then her half a pill once daily for a week then stop. In lieu of that I would like to increase Sinemet to one whole pill 3 times a day. I made the adjustment on the Sinemet prescription and discontinued her Mirapex prescription long-term. I gave her written instructions and highlighted those for her so she can share it with the staff at [facility]. I  would like to see her back in 3-4 months, sooner if the need arises. Her daytime somnolence is a little bit better she states.  I answered all her questions today and the patient was in agreement. I encouraged her to call with any interim questions, concerns, problems or updates and refill requests.

## 2014-07-07 NOTE — Patient Instructions (Addendum)
I think your Parkinson's disease has remained fairly stable, which is reassuring. Nevertheless, as you know, this disease does progress with time. It can affect your balance, your memory, your mood, your bowel and bladder function, your posture, balance and walking. Overall you are doing fairly well but I do want to suggest a few things today:  Remember to drink plenty of fluid, eat healthy meals and do not skip any meals. Try to eat protein with a every meal and eat a healthy snack such as fruit or nuts in between meals. Try to keep a regular sleep-wake schedule and try to exercise daily, particularly in the form of walking, 20-30 minutes a day, if you can.   Taking your medication on schedule is key.   Try to stay active physically and mentally. Engage in social activities in your community and with your family and try to keep up with current events by reading the newspaper or watching the news. Try to do word puzzles and you may like to do word puzzles and brain games on the computer such as on [email or web address]/.   As far as your medications are concerned, I would like to suggest that you take your current medication with the following additional changes:  We will gradually stop the mirapex 0.5 mg: take 1/2 pill twice daily for one week, then 1/2 pill daily for 1 week, then stop. I will increase your sinemet to 1 pill three times a day.   As far as diagnostic testing, I will order: no new test needed.   I would like to see you back in 4 months, sooner if we need to. Please call us with any interim questions, concerns, problems, updates or refill requests.   Our phone number is [phone]. We also have an after hours call service for urgent matters and there is a physician on-call for urgent questions, that cannot wait till the next work day. For any emergencies you know to call 911 or go to the nearest emergency room.

## 2014-07-07 NOTE — Telephone Encounter (Signed)
[name] called saying the Rx she was on called Mirapex is to be discontinued. She also said the Rx she was prescribed, Sinemet (1pill 3x's/day) needs to be sent to [facility]. If you have any questions, please call the pt. Pt ph# [phone] Thank you.

## 2014-07-08 NOTE — Telephone Encounter (Signed)
Dr Rexene Alberts sent this Rx to [facility] at [place] on 12/04.   (I was not able to access this chart yesterday due to system outage)   carbidopa-levodopa (SINEMET IR) 25-100 MG per tablet 270 tablet 3 07/07/2014      Sig - Route: Take 1 tablet by mouth 3 (three) times daily. - Oral    E-Prescribing Status: Receipt confirmed by pharmacy (07/07/2014 10:26 AM EST)     Associated Diagnoses    Parkinson's disease - Primary      Status post THR (total hip replacement)      Right hip pain      Right-sided low back pain without sciatica      Scoliosis (and kyphoscoliosis), idiopathic      Bilateral lower extremity edema       Pharmacy    [facility]

## 2014-07-13 ENCOUNTER — Ambulatory Visit (INDEPENDENT_AMBULATORY_CARE_PROVIDER_SITE_OTHER): Payer: Medicare Other | Admitting: Internal Medicine

## 2014-07-13 ENCOUNTER — Encounter: Payer: Self-pay | Admitting: Internal Medicine

## 2014-07-13 VITALS — BP 108/58 | HR 92 | Temp 97.5°F | Resp 12 | Ht 62.0 in | Wt 144.2 lb

## 2014-07-13 DIAGNOSIS — N2889 Other specified disorders of kidney and ureter: Secondary | ICD-10-CM

## 2014-07-13 DIAGNOSIS — Z299 Encounter for prophylactic measures, unspecified: Secondary | ICD-10-CM

## 2014-07-13 DIAGNOSIS — Z23 Encounter for immunization: Secondary | ICD-10-CM

## 2014-07-13 DIAGNOSIS — R030 Elevated blood-pressure reading, without diagnosis of hypertension: Secondary | ICD-10-CM

## 2014-07-13 DIAGNOSIS — G20 Parkinson's disease: Secondary | ICD-10-CM

## 2014-07-13 DIAGNOSIS — Z418 Encounter for other procedures for purposes other than remedying health state: Secondary | ICD-10-CM

## 2014-07-13 DIAGNOSIS — IMO0001 Reserved for inherently not codable concepts without codable children: Secondary | ICD-10-CM

## 2014-07-13 NOTE — Patient Instructions (Signed)
The medication that we think you should ask about for your bladder is called Myrbetriq. This is a medicine that works similar to [employer] but it has less side effects with the Parkinson's and with dry mouth.  We will not draw any blood work today. We will see you back in about 6-12 months for your checkup. If you have any new problems or questions before then please feel free to call us back sooner

## 2014-07-13 NOTE — Progress Notes (Signed)
Pre visit review using our clinic review tool, if applicable. No additional management support is needed unless otherwise documented below in the visit note. 

## 2014-07-14 NOTE — Assessment & Plan Note (Signed)
Repeat CT scan in January, status post ablation in September of this year.

## 2014-07-14 NOTE — Assessment & Plan Note (Signed)
An anticholinergic such as Dawn Ashley may not be the best choice for bladder dysfunction given that she is thought to have Parkinson's disease. Very mild at this time and she does not notice many symptoms on her medication. Talked with her about the fact that Dawn Ashley should likely be stopped. She did not wish to do that today although I did give her information to provide to her other doctor who is prescribing the [place]. Offered mybetriq as an alternative.

## 2014-07-14 NOTE — Assessment & Plan Note (Signed)
Patient does take Lasix as well as potassium for blood pressure. Unclear if this is needed and may be worthwhile to try stopping. Patient was resistant to changes in regimen today. We'll discuss again at next visit.

## 2014-07-14 NOTE — Progress Notes (Signed)
   Subjective:    Patient ID: Dawn Ashley, female    DOB: [birth year], 78 y.o.   MRN: [id number]  HPI The patient is a 78 year old female comes in today to establish care. She does have past medical history of renal mass, Parkinson's, hypertension, compression fracture. She is doing well overall. He does not have pain on a daily basis. She is able to get around at home and do many of her ADLs by herself. She declines needing any help at home. She denies any recent falls. She is concerned that she is not hearing as well as she used to and she thinks perhaps her ears are clogged with wax. She has been taking Toviaz for increased urinary frequency and notes that it gives her a dry mouth.  Review of Systems  Constitutional: Negative for fever, activity change, appetite change, fatigue and unexpected weight change.  HENT: Negative for congestion, sinus pressure, sore throat and tinnitus.   Respiratory: Negative for cough, chest tightness, shortness of breath and wheezing.   Cardiovascular: Negative for chest pain, palpitations and leg swelling.  Gastrointestinal: Negative for abdominal pain, diarrhea, constipation and abdominal distention.  Musculoskeletal: Negative.   Skin: Negative.   Neurological: Negative.   Psychiatric/Behavioral: Negative.       Objective:   Physical Exam  Constitutional: She is oriented to person, place, and time. She appears well-developed and well-nourished.  HENT:  Head: Normocephalic and atraumatic.  Right Ear: External ear normal.  Left Ear: External ear normal.  Nose: Nose normal.  Eyes: EOM are normal.  Neck: Normal range of motion.  Cardiovascular: Normal rate and regular rhythm.   Pulmonary/Chest: Effort normal and breath sounds normal. No respiratory distress. She has no wheezes. She has no rales.  Abdominal: Soft. Bowel sounds are normal. She exhibits no distension. There is no tenderness. There is no rebound.  Neurological: She is alert and oriented to  person, place, and time. Coordination normal.  Skin: Skin is warm and dry.   Filed Vitals:   07/13/14 1459  BP: 108/58  Pulse: 92  Temp: 97.5 F (36.4 C)  TempSrc: Oral  Resp: 12  Height: 5\' 2"  (1.575 m)  Weight: 144 lb 3.2 oz (65.409 kg)  SpO2: 97%      Assessment & Plan:

## 2014-07-20 ENCOUNTER — Inpatient Hospital Stay: Admission: RE | Admit: 2014-07-20 | Payer: Medicare Other | Source: Ambulatory Visit

## 2014-07-20 ENCOUNTER — Ambulatory Visit (HOSPITAL_COMMUNITY): Payer: Medicare Other

## 2014-07-20 ENCOUNTER — Other Ambulatory Visit: Payer: Medicare Other

## 2014-07-31 ENCOUNTER — Telehealth: Payer: Self-pay | Admitting: Neurology

## 2014-07-31 NOTE — Telephone Encounter (Signed)
FYI - Patient stated office visit summary from 12/4 on My chart, states she's experiencing Left hip pain and it should be Right hip pain.

## 2014-07-31 NOTE — Telephone Encounter (Signed)
WID - Patient stated since switching to Rx carbidopa-levodopa (SINEMET IR) 25-100 MG per tablet, she's experienced two falls this weekend.  Patient feels very weak.  Please call and advise.

## 2014-08-01 NOTE — Telephone Encounter (Signed)
Patients chart shows Rt hip pain.

## 2014-08-02 NOTE — Telephone Encounter (Signed)
WID - Patient calling back regarding phone note from 12/28.  Questioning if switching medication to Carbidopa-levodopa will cause falls.  Please call and advise.

## 2014-08-07 NOTE — Telephone Encounter (Signed)
We did not actually change the medicine but increased the dose of Sinemet from 1/2 pill to a whole pill three times a day and stopped the Mirapex. Please ask her to watch her symptoms some more. Increasing sinemet should not cause her to fall more.

## 2014-08-08 ENCOUNTER — Other Ambulatory Visit: Payer: Self-pay | Admitting: Internal Medicine

## 2014-08-08 NOTE — Telephone Encounter (Signed)
Shared message with patient per Dr Guadelupe Sabin note below, she verbalized understanding

## 2014-08-15 LAB — CREATININE WITH EST GFR
Creat: 0.93 mg/dL (ref 0.50–1.10)
GFR, Est African American: 69 mL/min
GFR, Est Non African American: 60 mL/min

## 2014-08-15 LAB — BUN: BUN: 26 mg/dL — ABNORMAL HIGH (ref 6–23)

## 2014-08-23 ENCOUNTER — Ambulatory Visit (HOSPITAL_COMMUNITY)
Admission: RE | Admit: 2014-08-23 | Discharge: 2014-08-23 | Disposition: A | Discharge status: Home / Self Care | Payer: Medicare Other | Source: Ambulatory Visit | Attending: Interventional Radiology | Admitting: Interventional Radiology

## 2014-08-23 ENCOUNTER — Ambulatory Visit
Admission: RE | Admit: 2014-08-23 | Discharge: 2014-08-23 | Disposition: A | Discharge status: Home / Self Care | Payer: Medicare Other | Source: Ambulatory Visit | Attending: Radiology | Admitting: Radiology

## 2014-08-23 DIAGNOSIS — N2889 Other specified disorders of kidney and ureter: Secondary | ICD-10-CM | POA: Insufficient documentation

## 2014-08-23 HISTORY — PX: IR GENERIC HISTORICAL: IMG1180011

## 2014-08-23 MED ORDER — IOHEXOL 300 MG/ML  SOLN
100.0000 mL | Freq: Once | INTRAMUSCULAR | Status: AC | PRN
  Administered 2014-08-23: 100 mL via INTRAVENOUS

## 2014-08-23 NOTE — Progress Notes (Signed)
Chief Complaint: Chief Complaint  Patient presents with  . Follow-up    4 mo follow up Cryoablation of Right Renal Mass   History of Present Illness: Dawn Ashley is a 79 y.o. female status post changes cryoablation of a right renal neoplasm on 04/21/2014. The patient has done well since treatment with no complaints. She denies any flank pain or urinary symptoms. Due to location and small size of the original tumor, biopsy of the tumor could not be accurately performed at the time of ablation. The ablation procedure was complicated by a small posterior pneumothorax that was successfully treated by aspiration without recurrence.  Past Medical History  Diagnosis Date  . Parkinson disease 01/2005 dx  . Back pain   . Kidney stones   . Heart murmur     h/o MVP, pt. reports murmur is almost resolved  . Arthritis     osteoporosis, osteoarthritis   . Swelling     legs / feet - takes Lasix to control  . GERD (gastroesophageal reflux disease)   . Renal mass, right   . Frequency of urination   . MVP (mitral valve prolapse)     Past Surgical History  Procedure Laterality Date  . Tonsillectomy    . Cataract extraction w/ intraocular lens  implant, bilateral  2011  2004  . Dilation and curettage of uterus  2012  . Back surgery  2009    spurs resting on nerve, lumbar  . Total hip arthroplasty Right 09/20/2012    Procedure: TOTAL HIP ARTHROPLASTY;  Surgeon: Kerin Salen, MD;  Location: [facility];  Service: Orthopedics;  Laterality: Right;  . Orif pelvic fracture Right 09/20/2012    Procedure: OPEN REDUCTION INTERNAL FIXATION (ORIF) PELVIC FRACTURE;  Surgeon: Kerin Salen, MD;  Location: [facility];  Service: Orthopedics;  Laterality: Right;  . Bone spurs removed form spine 2007  2007  . Kidney surgery  05/2014    growth removed    Allergies: Gabapentin and Tramadol  Medications: Prior to Admission medications   Medication Sig Start Date End Date Taking? Authorizing Provider  alendronate  (FOSAMAX) 70 MG tablet Take 70 mg by mouth every Saturday. Take with a full glass of water on an empty stomach.   Yes Historical Provider, MD  aspirin 81 MG chewable tablet Chew 81 mg by mouth every morning.   Yes Historical Provider, MD  Calcium Carbonate-Vitamin D (CALCIUM + D PO) Take 1 tablet by mouth 2 (two) times daily. 630mg    Yes Historical Provider, MD  carbidopa-levodopa (SINEMET IR) 25-100 MG per tablet Take 1 tablet by mouth 3 (three) times daily. 07/07/14  Yes Star Age, MD  diclofenac sodium (VOLTAREN) 1 % GEL Apply 2 g topically 4 (four) times daily as needed (For pain.).   Yes Historical Provider, MD  famotidine (PEPCID) 20 MG tablet Take 1 tablet (20 mg total) by mouth 2 (two) times daily. 08/09/14  Yes Olga Millers, MD  furosemide (LASIX) 40 MG tablet Take 70 mg by mouth every morning.    Yes Historical Provider, MD  KLOR-CON M10 10 MEQ tablet TAKE 1 TABLET DAILY 05/09/14  Yes Rowe Clack, MD  meloxicam (MOBIC) 15 MG tablet 2 (two) times daily.  06/17/14  Yes Historical Provider, MD  polyvinyl alcohol (LIQUIFILM TEARS) 1.4 % ophthalmic solution Place 1 drop into both eyes daily as needed for dry eyes.   Yes Historical Provider, MD  rasagiline (AZILECT) 1 MG TABS tablet Take 1 mg by mouth  every morning.   Yes Historical Provider, MD  TOVIAZ 8 MG TB24 tablet Take 8 mg by mouth daily.  07/07/14  Yes Historical Provider, MD  Acetaminophen (TYLENOL PO) Take by mouth daily. 1 tablet daily    Historical Provider, MD  pramipexole (MIRAPEX) 0.5 MG tablet Take 0.5 mg by mouth 3 (three) times daily.  05/06/14   Historical Provider, MD    Family History  Problem Relation Age of Onset  . Breast cancer Maternal Grandmother   . Arthritis Maternal Grandmother   . Hypertension Other   . Osteoporosis Other   . Breast cancer Mother     had mastectomy in 1982  . Heart disease Mother   . Stroke Mother     mild, 2001  . Hypertension Mother   . Osteoporosis Mother     also had spine  surgery 1987, & hip repalcement  . Arthritis Sister   . Hypertension Sister   . Parkinsonism Maternal Uncle   . Arthritis Maternal Grandfather     History   Social History  . Marital Status: Widowed    Spouse Name: N/A    Number of Children: 2  . Years of Education: 12   Occupational History  .      retired   Social History Main Topics  . Smoking status: Never Smoker   . Smokeless tobacco: Never Used  . Alcohol Use: 1.2 oz/week    2 Glasses of wine per week     Comment: glass of wine, occas.  . Drug Use: No  . Sexual Activity: No   Other Topics Concern  . Not on file   Social History Narrative   Patient is right handed, resides alone    Review of Systems: A 12 point ROS discussed and pertinent positives are indicated in the HPI above.  All other systems are negative.  Review of Systems  Constitutional: Negative.   Respiratory: Negative.   Cardiovascular: Negative.   Gastrointestinal: Negative.   Genitourinary: Negative.      Vital Signs: BP 168/65 mmHg  Pulse 80  Temp(Src) 97.7 F (36.5 C) (Oral)  Resp 14  SpO2 99%  Physical Exam  Constitutional: She is oriented to person, place, and time. She appears well-developed and well-nourished. No distress.  Abdominal: Soft. She exhibits no distension. There is no tenderness.  Neurological: She is alert and oriented to person, place, and time.  Skin: She is not diaphoretic.  Nursing note and vitals reviewed.   Imaging: Ct Abd Wo & W Cm  08/23/2014   CLINICAL DATA:  Subsequent encounter for 4 month follow-up of cryoablation of right renal mass.  EXAM: CT ABDOMEN WITHOUT AND WITH CONTRAST  TECHNIQUE: Multidetector CT imaging of the abdomen was performed following the standard protocol before and following the bolus administration of intravenous contrast.  CONTRAST:  175mL OMNIPAQUE IOHEXOL 300 MG/ML  SOLN  COMPARISON:  Procedure imaging of 04/21/2014. Preprocedure CT of 02/13/2014.  FINDINGS: Lower chest: Minimal  right base nodularity on image 12 of series 9. This may been present on the prior exam, and obscured by volume loss in this area. Indeterminate. Moderate cardiomegaly, without pericardial or pleural effusion. A moderate hiatal hernia.  Hepatobiliary: Normal liver. Calcified gallstone suspected. No acute cholecystitis or biliary ductal dilatation.  Pancreas: Low-density lesion off the ventral aspect of the pancreatic body measures 1.0 cm on image 32 of series 6. This may have been present on image 24 series 3 of the prior exam. No pancreatic duct dilatation evidence  of acute pancreatitis.  Spleen: Normal  Adrenals/Urinary Tract: Normal adrenal glands. Left renal collecting system calculi, including a conglomeration of interpolar stones which measure 7 mm. Interpolar left renal low-density lesion is likely a cyst.  Ablation related volume loss is identified about the anterior aspect of the interpolar right kidney on image 31. Just inferior to this ablation defect is residual enhancing lesion which measures 1.2 x 1.3 cm on image 34 of series 4 and image 33 of series 6. Likely decrease in size from 1.4 x 1.7 cm on the preprocedure study.  No collecting system complication.  Stomach/Bowel: Normal distal stomach. Colonic stool burden suggests constipation. Normal abdominal small bowel.  Vascular/Lymphatic: Aortic and branch vessel atherosclerosis. Atherosclerotic irregularity at the origin of both renal arteries. Right renal vein patent. No retroperitoneal or retrocrural adenopathy.  Other: No ascites.  Musculoskeletal: Osteopenia. Convex right lumbar spine curvature. Remote trauma involving the posterior right twelfth rib. This may be new since the diagnostic study. L1 compression deformity is moderate and chronic. A mild to moderate L4 compression deformity is new since 02/13/2014.  IMPRESSION: 1. Ablation defect in the interpolar right kidney anteriorly. Decreased but persistent solid enhancing lesion positioned at the  inferior aspect of the ablation defect. 2. No evidence of right renal vein thrombosis or metastatic disease. 3. Moderate hiatal hernia. 4. L4 and right twelfth rib fractures which are new since the prior diagnostic CT of 02/13/2014. 5. Cholelithiasis. 6.  Possible constipation. 7. Cystic lesion which is likely exophytic off the anterior pancreatic body. Most likely a pseudocyst. Recommend attention on follow-up.   Electronically Signed   By: Abigail Miyamoto M.D.   On: 08/23/2014 09:48    Labs:  CBC:  Recent Labs  04/18/14 0920 04/22/14 0427  WBC 6.9 7.6  HGB 11.8* 9.5*  HCT 36.5 29.6*  PLT 189 141*    COAGS:  Recent Labs  04/18/14 0920  INR 0.94  APTT 27    BMP:  Recent Labs  03/08/14  04/18/14 0920 04/21/14 0625 04/22/14 0427 05/25/14 1250 08/14/14 0923  NA 140  --  144 141 141  --   --   K 4.4  --  4.4 4.1 4.3  --   --   CL  --   --  101 104 109  --   --   CO2  --   --  32 25 22  --   --   GLUCOSE  --   --  94 87 105*  --   --   BUN  --   < > 35* 40* 27* 30* 26*  CALCIUM  --   --  10.3 8.7 8.1*  --   --   CREATININE 1.0  < > 1.33* 1.19* 0.97 1.00 0.93  GFRNONAA  --   < > 38* 43* 55* 55* 60  GFRAA  --   < > 44* 50* 64* 63 69  < > = values in this interval not displayed.  LIVER FUNCTION TESTS: No results for input(s): BILITOT, AST, ALT, ALKPHOS, PROT, ALBUMIN in the last 8760 hours.  TUMOR MARKERS: No results for input(s): AFPTM, CEA, CA199, CHROMGRNA in the last 8760 hours.  Assessment and Plan:  Renal function is normal and stable following the procedure. I reviewed CT findings with the patient and her son. This shows potential residual nodular enhancing tissue along the inferior margin of the percutaneous ablation defect. Although initially suspicious for some residual enhancing tumor, I told Mrs. Winfield that I  am not 100% convinced that this is enhancing tumor. I recommended a follow-up CT in 6 months to reevaluate the ablation site. We did discuss the  potential need for future repeat ablation should there be convincing evidence of an enlarging area of abnormal enhancement.  The patient is agreeable to performing follow-up CT in 6 months to reevaluate potential residual enhancement along the inferior aspect of ablation.  I spent a total of 20 minutes face to face in clinical consultation, greater than 50% of which was counseling/coordinating care for right renal tumor.   Venetia Night. Kathlene Cote, M.D. Pager:  [phone]     Signed: Aletta Edouard T 08/23/2014, 11:51 AM

## 2014-08-23 NOTE — Progress Notes (Signed)
Denies hematuria or any other problems with urination.  Denies pain associated w/ Cryoablation.    Jassica Zazueta Riki Rusk, RN 08/23/2014 10:52 AM

## 2014-08-24 ENCOUNTER — Ambulatory Visit: Payer: Medicare Other | Admitting: Internal Medicine

## 2014-09-07 ENCOUNTER — Other Ambulatory Visit: Payer: Self-pay | Admitting: Internal Medicine

## 2014-09-15 ENCOUNTER — Other Ambulatory Visit: Payer: Self-pay | Admitting: Geriatric Medicine

## 2014-09-15 MED ORDER — FAMOTIDINE 20 MG PO TABS: 20.0000 mg | ORAL_TABLET | Freq: Two times a day (BID) | ORAL | Status: DC

## 2014-09-22 ENCOUNTER — Ambulatory Visit: Payer: Medicare Other | Admitting: Neurology

## 2014-09-25 ENCOUNTER — Ambulatory Visit: Payer: Medicare Other | Admitting: Internal Medicine

## 2014-09-27 ENCOUNTER — Telehealth: Payer: Self-pay | Admitting: *Deleted

## 2014-09-27 NOTE — Telephone Encounter (Signed)
[place] Night - Client TELEPHONE ADVICE RECORD [facility] Patient Name: Dawn Ashley Gender: Female DOB: August 23, 1935 Age: 79 Y 9 M 10 D Return Phone Number: [phone] (Primary), [phone] (Secondary) Address: City/State/Zip: [place] [employer] [facility] [place] Night - Client Client Site [place] Phys[facility] [place], [place] Type Call [place] Name [place] Phone Number [phone] Relationship To Patient Self Is this call to report lab results? No Call Type General Information Initial Comment Caller states she needs to cancel her appt on Monday General Information Type Appointment Nurse Assessment Guidelines Guideline Title Affirmed Question Affirmed Notes Nurse Date/Time (Eastern Time) Disp. Time Eilene Ghazi Time) Disposition Final User 09/23/2014 10:23:48 AM General Information Provided Yes Nyoka Cowden, Amy After Care Instructions Given Call Event Type User Date / Time Description

## 2014-09-28 ENCOUNTER — Encounter: Payer: Self-pay | Admitting: Internal Medicine

## 2014-09-28 ENCOUNTER — Ambulatory Visit (INDEPENDENT_AMBULATORY_CARE_PROVIDER_SITE_OTHER): Payer: Medicare Other | Admitting: Internal Medicine

## 2014-09-28 VITALS — BP 130/80 | HR 86 | Temp 97.9°F | Resp 12 | Ht 62.0 in | Wt 131.0 lb

## 2014-09-28 DIAGNOSIS — R634 Abnormal weight loss: Secondary | ICD-10-CM

## 2014-09-28 MED ORDER — PANTOPRAZOLE SODIUM 40 MG PO TBEC: 40.0000 mg | DELAYED_RELEASE_TABLET | Freq: Every day | ORAL | Status: DC

## 2014-09-28 NOTE — Patient Instructions (Signed)
We would like you to try a nutrition supplement with lunch such as boost or ensure or carnation to add more calories.   The toe is likely getting red due to some decreased circulation and trying to prop them up may help.   We will have you use the stockings on your legs. You can STOP the lasix (furosemide) and STOP the potassium pills.   We will also have you STOP taking the pepcid (famotidine). We will send in a stronger medicine called protonix (pantoprazole) which you take 1 pill before breakfast for the acid in the stomach.     Food Choices for Gastroesophageal Reflux Disease When you have gastroesophageal reflux disease (GERD), the foods you eat and your eating habits are very important. Choosing the right foods can help ease the discomfort of GERD. WHAT GENERAL GUIDELINES DO I NEED TO FOLLOW?  Choose fruits, vegetables, whole grains, low-fat dairy products, and low-fat meat, fish, and poultry.  Limit fats such as oils, salad dressings, butter, nuts, and avocado.  Keep a food diary to identify foods that cause symptoms.  Avoid foods that cause reflux. These may be different for different people.  Eat frequent small meals instead of three large meals each day.  Eat your meals slowly, in a relaxed setting.  Limit fried foods.  Cook foods using methods other than frying.  Avoid drinking alcohol.  Avoid drinking large amounts of liquids with your meals.  Avoid bending over or lying down until 2-3 hours after eating. WHAT FOODS ARE NOT RECOMMENDED? The following are some foods and drinks that may worsen your symptoms: Vegetables Tomatoes. Tomato juice. Tomato and spaghetti sauce. Chili peppers. Onion and garlic. Horseradish. Fruits Oranges, grapefruit, and lemon (fruit and juice). Meats High-fat meats, fish, and poultry. This includes hot dogs, ribs, ham, sausage, salami, and bacon. Dairy Whole milk and chocolate milk. Sour cream. Cream. Butter. Ice cream. Cream cheese.   Beverages Coffee and tea, with or without caffeine. Carbonated beverages or energy drinks. Condiments Hot sauce. Barbecue sauce.  Sweets/Desserts Chocolate and cocoa. Donuts. Peppermint and spearmint. Fats and Oils High-fat foods, including [place] fries and potato chips. Other Vinegar. Strong spices, such as black pepper, white pepper, red pepper, cayenne, curry powder, cloves, ginger, and chili powder. The items listed above may not be a complete list of foods and beverages to avoid. Contact your dietitian for more information. Document Released: 07/21/2005 Document Revised: 07/26/2013 Document Reviewed: 05/25/2013 [facility] Patient Information 2015 [place], [place]. This information is not intended to replace advice given to you by your health care provider. Make sure you discuss any questions you have with your health care provider.

## 2014-09-28 NOTE — Assessment & Plan Note (Signed)
She is a never smoker, no cough. No change in bowel habits. Will see if she can eat better and maintain weight. If still having problems will start with CT abd/pelvis for any abnormalities. She does have history of R renal mass which was ablated 04/21/14 (this could be responsible for some of the weight loss). She adamantly denies other symptoms of depression or anxiety.

## 2014-09-28 NOTE — Progress Notes (Signed)
   Subjective:    Patient ID: Dawn Ashley, female    DOB: Nov 03, 1935, 79 y.o.   MRN: [id number]  HPI The patient is a 79 YO female coming in for an acute visit for weight loss. She has dropped about 10 pounds in the last 1 year because she is not as hungry and is not eating well. She denies any other new symptoms such as fevers, chills, cough, change in bowel habits, blood in stool. She eats an ice pop for lunch, 1/2 piece toast for breakfast, and slightly better dinner. She denies any problems with making food or having money for food.   Review of Systems  Constitutional: Positive for unexpected weight change. Negative for fever, activity change, appetite change and fatigue.  HENT: Negative for congestion, sinus pressure, sore throat and tinnitus.   Respiratory: Negative for cough, chest tightness, shortness of breath and wheezing.   Cardiovascular: Negative for chest pain, palpitations and leg swelling.  Gastrointestinal: Negative for abdominal pain, diarrhea, constipation and abdominal distention.  Musculoskeletal: Negative.   Skin: Negative.   Neurological: Negative.   Psychiatric/Behavioral: Negative.       Objective:   Physical Exam  Constitutional: She is oriented to person, place, and time. She appears well-developed and well-nourished.  HENT:  Head: Normocephalic and atraumatic.  Right Ear: External ear normal.  Left Ear: External ear normal.  Nose: Nose normal.  Eyes: EOM are normal.  Neck: Normal range of motion.  Cardiovascular: Normal rate and regular rhythm.   Pulmonary/Chest: Effort normal and breath sounds normal. No respiratory distress. She has no wheezes. She has no rales.  Abdominal: Soft. Bowel sounds are normal. She exhibits no distension and no mass. There is no tenderness. There is no rebound.  Lymphadenopathy:    She has no cervical adenopathy.  Neurological: She is alert and oriented to person, place, and time. Coordination normal.  Skin: Skin is warm  and dry.   Filed Vitals:   09/28/14 1343  BP: 130/80  Pulse: 86  Temp: 97.9 F (36.6 C)  TempSrc: Oral  Resp: 12  Height: 5\' 2"  (1.575 m)  Weight: 131 lb (59.421 kg)  SpO2: 96%      Assessment & Plan:

## 2014-09-28 NOTE — Progress Notes (Signed)
Pre visit review using our clinic review tool, if applicable. No additional management support is needed unless otherwise documented below in the visit note. 

## 2014-10-21 ENCOUNTER — Ambulatory Visit (INDEPENDENT_AMBULATORY_CARE_PROVIDER_SITE_OTHER): Payer: Medicare Other

## 2014-10-21 ENCOUNTER — Ambulatory Visit (INDEPENDENT_AMBULATORY_CARE_PROVIDER_SITE_OTHER): Payer: Medicare Other | Admitting: Family Medicine

## 2014-10-21 VITALS — BP 138/65 | HR 90 | Temp 97.5°F | Resp 16 | Ht 63.0 in | Wt 135.0 lb

## 2014-10-21 DIAGNOSIS — M810 Age-related osteoporosis without current pathological fracture: Secondary | ICD-10-CM

## 2014-10-21 DIAGNOSIS — M159 Polyosteoarthritis, unspecified: Secondary | ICD-10-CM

## 2014-10-21 DIAGNOSIS — M545 Low back pain: Secondary | ICD-10-CM

## 2014-10-21 DIAGNOSIS — M150 Primary generalized (osteo)arthritis: Secondary | ICD-10-CM | POA: Diagnosis not present

## 2014-10-21 DIAGNOSIS — M8949 Other hypertrophic osteoarthropathy, multiple sites: Secondary | ICD-10-CM

## 2014-10-21 DIAGNOSIS — T148 Other injury of unspecified body region: Secondary | ICD-10-CM

## 2014-10-21 DIAGNOSIS — T148XXA Other injury of unspecified body region, initial encounter: Secondary | ICD-10-CM

## 2014-10-21 DIAGNOSIS — M79605 Pain in left leg: Secondary | ICD-10-CM

## 2014-10-21 NOTE — Progress Notes (Signed)
Subjective: 79 year old lady with a pain in her left hip and knee and in between on the lateral aspect left leg. Knows of no specific injury but she has been getting physical therapy. She was not able to get it this past week because she was hurting too much. Does of no injuries or falls. Has a history of a right total hip arthroplasty. She has an old osteoporotic spine which is giving him a lot of trouble. She lives at a assisted living facility.  Daughter in [place] and son accompanied her here today.  Objective: She has a bruise just above the sacrum at the low back. She does not know where that came from. Is just left of midline. She is very scoliotic in her spine. She has a lot of great amount of loss of height. She has no tenderness of the trochanteric bursal region. The hip has fair range of motion on both the left than the right, better on the left. The knee has fair range of motion on passive range of motion. Her thigh is tender, more laterally. Ankles are normal.  [facility] reading (PRIMARY) by  Dr. Linna Darner Significant degenerative disease lumbar spine with marked arthritic deformity and scoliotic changes. She has an old fracture of her left pubic bone. Right hip arthroplasty with arthritic changes. Left hip does not look as bad. No acute fractures noted.  Assessment: Left leg pain Arthritis Osteoporosis Ecchymosis Scoliosis  Plan: Use the Voltaren gel that she has. Increase the Tylenol 213 oh milligrams twice daily of the extended release  Return if worse

## 2014-10-21 NOTE — Patient Instructions (Signed)
Increase the arthritis strength Tylenol to 2 pills (1300 mg) twice daily for pain  Use the Voltaren gel on your leg  Hold off on physical therapy for this week, then can resume the following week if no worse and hopefully somewhat better  If you do the group therapy try to do it gently and abstain from things that cause you pain  It is still good to keep some motion and activity and not just let your body freeze up.  Return if worse

## 2014-10-26 ENCOUNTER — Ambulatory Visit (INDEPENDENT_AMBULATORY_CARE_PROVIDER_SITE_OTHER): Payer: Medicare Other | Admitting: Internal Medicine

## 2014-10-26 DIAGNOSIS — M1712 Unilateral primary osteoarthritis, left knee: Secondary | ICD-10-CM | POA: Insufficient documentation

## 2014-10-26 DIAGNOSIS — M25562 Pain in left knee: Secondary | ICD-10-CM

## 2014-10-26 MED ORDER — METHYLPREDNISOLONE ACETATE 40 MG/ML IJ SUSP
40.0000 mg | Freq: Once | INTRAMUSCULAR | Status: AC
  Administered 2014-10-26: 40 mg via INTRA_ARTICULAR

## 2014-10-26 NOTE — Assessment & Plan Note (Signed)
Injected per procedure note. Consent obtained and risks and benefits discussed. Time out done prior to procedure. Advised to ice 10 minutes every 4 hours today and elevate while sitting. Return parameters discussed.

## 2014-10-26 NOTE — Progress Notes (Signed)
   Subjective:    Patient ID: Dawn Ashley, female    DOB: 03/22/1936, 79 y.o.   MRN: [id number]  HPI The patient is a 79 YO female who is coming in today for left knee and thigh pain. She denies injury and has had it for about 3 weeks. Seen at urgent care (notes reviewed today 2 x-ray without new fracture, old compression fracture in L1 and L4). Tried tylenol and voltaren gel with some relief. Rates pain 5/10 and down to 2-3/10 with the medicine. Had been working with PT and taking a week off per urgent care recommendations. Knee is most painful. Wants handicapped sticker. Back pain is better since urgent care. Denies falls. Uses walker to walk.   Review of Systems  Constitutional: Positive for activity change. Negative for fever, appetite change and fatigue.  Respiratory: Negative.   Musculoskeletal: Positive for joint swelling and arthralgias. Negative for back pain.  Neurological: Negative.       Objective:   Physical Exam  Constitutional: She appears well-developed and well-nourished.  HENT:  Head: Normocephalic and atraumatic.  Cardiovascular: Normal rate and regular rhythm.   Pulmonary/Chest: Effort normal.  Abdominal: Soft.  Musculoskeletal:  Trace edema in the left knee, pain to palpation along the medial inferior aspect of the patella.   Neurological: She is alert. Coordination abnormal.  Uses walker for stability   Vitals done during a downtime and were not manually entered. BP normal, HR normal, O2 sat normal.     Assessment & Plan:

## 2014-10-26 NOTE — Patient Instructions (Signed)
We have given you a medicine in your knee to help with the pain. You should ice the knee every 4 hours today and try to keep it elevated if you are sitting.  Keep using the tylenol and the voltaren gel for pain.   Take 2 weeks off physical therapy and then start back with once a week.   Knee Injection Joint injections are shots. Your caregiver will place a needle into your knee joint. The needle is used to put medicine into the joint. These shots can be used to help treat different painful knee conditions such as osteoarthritis, bursitis, local flare-ups of rheumatoid arthritis, and pseudogout. Anti-inflammatory medicines such as corticosteroids and anesthetics are the most common medicines used for joint and soft tissue injections.  PROCEDURE  The skin over the kneecap will be cleaned with an antiseptic solution.  Your caregiver will inject a small amount of a local anesthetic (a medicine like Novocaine) just under the skin in the area that was cleaned.  After the area becomes numb, a second injection is done. This second injection usually includes an anesthetic and an anti-inflammatory medicine called a steroid or cortisone. The needle is carefully placed in between the kneecap and the knee, and the medicine is injected into the joint space.  After the injection is done, the needle is removed. Your caregiver may place a bandage over the injection site. The whole procedure takes no more than a couple of minutes. BEFORE THE PROCEDURE  Wash all of the skin around the entire knee area. Try to remove any loose, scaling skin. There is no other specific preparation necessary unless advised otherwise by your caregiver. LET YOUR CAREGIVER KNOW ABOUT:   Allergies.  Medications taken including herbs, eye drops, over the counter medications, and creams.  Use of steroids (by mouth or creams).  Possible pregnancy, if applicable.  Previous problems with anesthetics or Novocaine.  History of blood  clots (thrombophlebitis).  History of bleeding or blood problems.  Previous surgery.  Other health problems. RISKS AND COMPLICATIONS Side effects from cortisone shots are rare. They include:   Slight bruising of the skin.  Shrinkage of the normal fatty tissue under the skin where the shot was given.  Increase in pain after the shot.  Infection.  Weakening of tendons or tendon rupture.  Allergic reaction to the medicine.  Diabetics may have a temporary increase in their blood sugar after a shot.  Cortisone can temporarily weaken the immune system. While receiving these shots, you should not get certain vaccines. Also, avoid contact with anyone who has chickenpox or measles. Especially if you have never had these diseases or have not been previously immunized. Your immune system may not be strong enough to fight off the infection while the cortisone is in your system. AFTER THE PROCEDURE   You can go home after the procedure.  You may need to put ice on the joint 15-20 minutes every 3 or 4 hours until the pain goes away.  You may need to put an elastic bandage on the joint. HOME CARE INSTRUCTIONS   Only take over-the-counter or prescription medicines for pain, discomfort, or fever as directed by your caregiver.  You should avoid stressing the joint. Unless advised otherwise, avoid activities that put a lot of pressure on a knee joint, such as:  Jogging.  Bicycling.  Recreational climbing.  Hiking.  Laying down and elevating the leg/knee above the level of your heart can help to minimize swelling. SEEK MEDICAL CARE IF:  You have repeated or worsening swelling.  There is drainage from the puncture area.  You develop red streaking that extends above or below the site where the needle was inserted. SEEK IMMEDIATE MEDICAL CARE IF:   You develop a fever.  You have pain that gets worse even though you are taking pain medicine.  The area is red and warm, and you  have trouble moving the joint. MAKE SURE YOU:   Understand these instructions.  Will watch your condition.  Will get help right away if you are not doing well or get worse. Document Released: 10/12/2006 Document Revised: 10/13/2011 Document Reviewed: 07/09/2007 [facility] Patient Information 2015 [place], [place]. This information is not intended to replace advice given to you by your health care provider. Make sure you discuss any questions you have with your health care provider.

## 2014-10-26 NOTE — Progress Notes (Signed)
Knee Arthrocentesis with Injection Procedure Note  Pre-operative Diagnosis: left knee pain  Post-operative Diagnosis: same  Indications: Symptom relief from osteoarthritis  Anesthesia: Lidocaine 1% without epinephrine without added sodium bicarbonate  Procedure Details  Time out performed and patient identified correct knee. Risks and benefits discussed prior to procedure.   Verbal consent was obtained for the procedure. The joint was prepped with alcohol and numbing spray was used for the skin. A 22 gauge needle was inserted into the inferior aspect of the joint from a lateral approach. 2 ml 1% lidocaine and 1 ml of depo-medrol 40 mg was then injected into the joint through the same needle. The needle was removed and the area cleansed and dressed.  Complications:  None; patient tolerated the procedure well.

## 2014-11-07 ENCOUNTER — Ambulatory Visit (INDEPENDENT_AMBULATORY_CARE_PROVIDER_SITE_OTHER): Payer: Medicare Other | Admitting: Neurology

## 2014-11-07 ENCOUNTER — Encounter: Payer: Self-pay | Admitting: Neurology

## 2014-11-07 VITALS — BP 174/81 | HR 92 | Resp 16 | Ht 62.0 in | Wt 130.0 lb

## 2014-11-07 DIAGNOSIS — M4120 Other idiopathic scoliosis, site unspecified: Secondary | ICD-10-CM

## 2014-11-07 DIAGNOSIS — G20 Parkinson's disease: Secondary | ICD-10-CM

## 2014-11-07 DIAGNOSIS — Z96649 Presence of unspecified artificial hip joint: Secondary | ICD-10-CM

## 2014-11-07 DIAGNOSIS — G20A1 Parkinson's disease without dyskinesia, without mention of fluctuations: Secondary | ICD-10-CM

## 2014-11-07 DIAGNOSIS — Z9660 Presence of unspecified orthopedic joint implant: Secondary | ICD-10-CM

## 2014-11-07 DIAGNOSIS — M5442 Lumbago with sciatica, left side: Secondary | ICD-10-CM

## 2014-11-07 DIAGNOSIS — M5441 Lumbago with sciatica, right side: Secondary | ICD-10-CM

## 2014-11-07 DIAGNOSIS — M25562 Pain in left knee: Secondary | ICD-10-CM

## 2014-11-07 NOTE — Progress Notes (Signed)
Subjective:    Patient ID: Dawn Ashley is a 79 y.o. female.  HPI     Interim history:   Dawn Ashley is a 79 year-old [place] lady, with an underlying medical history of carotid artery disease, lumbar spine disease with multilevel degenerative joint disease and hypertrophic facet arthritis, s/p back surgery in 2008, cataract surgery and R THR on 09/20/2012, who presents for followup consultation of her L sided predominant Parkinson's disease, which was diagnosed in 2006 with symptoms dating back to a few years prior. The patient is accompanied by her son, Dawn Ashley, today. I last saw her on 07/07/2014, at which time she was doing well, and she denied any side effects from Sinemet. She was doing physical therapy at [facility]. Mood and memory were stable. She had no recent falls. She was supposed to spend her holidays with her 2 sons, one in [place] and the other in [place], both with 2 children each. She was using her walker or cane. She had lower back injection with a follow-up scheduled. She was on a bladder medication. I asked her to discontinue Mirapex altogether due to lower extremity swelling. I increased her Sinemet to one full pill 3 times a day.  In the interim, she presented to her primary care physician on 10/26/2014 with left knee pain. This was deemed secondary to osteoarthritis. I reviewed the office records from Dr. Doug Sou. She had steroid injection into her left knee.  Prior to that she presented to urgent care on 10/21/2014 with similar complaints of left knee pain and also low back pain. She had a lumbar spine x-ray on 10/21/2014 which I reviewed as well as pelvis x-ray in 2 views on 10/21/2014 which also reviewed: No evidence of acute abnormality. L1 and L4 compression fractures again noted as well as severe thoracolumbar scoliosis and multilevel degenerative changes. No fracture or acute finding. Right hip prosthesis is well aligned no evidence of significant loosening.   Today,  11/07/2014: She is able to provide most of her history herself. Additional information is provided by her son. She reports that she stopped PT, which she was doing a [employer]. She was seen by her spine doctor. She is currently on an oral prednisone taper. She has noticed more reflux symptoms. Her primary care physician changed her from Pepcid to a PPI. She is taking Mobic 15 mg strength half a pill twice daily as well currently. This was per Dr. Linna Darner. Her left knee steroid injection was not very helpful she felt. She has bilateral lower back pain without radiation typically. She has an abnormal posture. She feels that the increase in Sinemet to 1 pill 3 times a day was helpful but has not noticed a drastic change. Bladder hyperactivity is well controlled on Myrbetriq. She is currently particularly bothered by her pain. Appetite has been less as well. She has lost some weight. She has started drinking some boost for supplement. She is finding it hard to take her Sinemet away from her mealtimes. Her son is worried about some memory loss. This is mild. She has some mild forgetfulness but has had more trouble lately balancing her checkbook. Of note, when she had her hip replacement surgery her son had taken over her finances and she found it hard to go back into it.  Previously: I saw her on 03/20/2014, at which time she reported no significant exacerbation of symptoms. She had stopped gabapentin as she felt too sleepy with it. She was in the home health  physical therapy at the time. At the time of her last visit I asked her to reduce Mirapex because of lower extremity swelling noted. I introduced a small dose of Sinemet starting with half a pill twice daily with increase to half a pill 3 times a day.   I saw her on 09/19/2013, at which time she reported having reduced her gabapentin. She was started on Gabapentin in 11/13, by her sports medicine doctor and was up to 300 mg tid, but has had sleepiness and  blurry vision with it. She was scheduled for a back injection on 10/06/13 under Dr. Mina Marble. Dr. Mayer Camel is her orthopedic doctor. She had started PT at [facility], 3 times a week. She felt stable with her PD Sx, and has never been on C/L. I kept her on Mirapex and Azilect. In March she called back reporting swelling and redness in her toes. I suggested she have it checked out by her primary care physician as it may indicate several issues such as with circulation, venous stasis, or cellulitis. She has been seeing her primary care physician for lower extremity swelling.  I saw her on 03/08/2013, at which time I felt she was quite stable and that she had recovered well from her recent total hip replacement surgery. She was complaining of daytime somnolence and I suggested changing her Mirapex immediate release to long-acting. However, she called back a few days later reporting that the long-acting Mirapex was not covered by her insurance and we changed her back to immediate release Mirapex.    I first met her on 12/06/12 at which time I did not change her medications. She previously followed with Dr. Morene Antu and was last seen by him on 09/08/2012, which time he felt that her sleepiness was in part d/t tramadol and stopped it. He also obtained blood work including CBC, CMP, TSH and urinalysis as well as B12 level and ordered a brain MRI. She had problems with her gait which he primarily attributed to right hip pain.   Her blood work showed mild elevated alkaline phosphatase and BUN of 28 and creatinine of 1.17. Her son was called back and advised that she should increase her fluid intake. Her urinalysis was not consistent with clear-cut UTI. Since her last visit with Dr. Erling Cruz she had right hip replacement surgery on 09/20/12. She has been in rehabilitation. She did not end up getting her brain scan because she had surgery soon after her last visit and was in rehabilitation since then. Her sleepiness and confusion  improved after her pain medication was changed from tramadol to diclofenac. She moved to [place]. She had developed some LE swelling and was switched from HCTZ to Lasix and advised to reduce her NSAIDs.    Her Past Medical History Is Significant For: Past Medical History  Diagnosis Date  . Parkinson disease 01/2005 dx  . Back pain   . Kidney stones   . Heart murmur     h/o MVP, pt. reports murmur is almost resolved  . Arthritis     osteoporosis, osteoarthritis   . Swelling     legs / feet - takes Lasix to control  . GERD (gastroesophageal reflux disease)   . Renal mass, right   . Frequency of urination   . MVP (mitral valve prolapse)     Her Past Surgical History Is Significant For: Past Surgical History  Procedure Laterality Date  . Tonsillectomy    . Cataract extraction w/ intraocular  lens  implant, bilateral  2011  2004  . Dilation and curettage of uterus  2012  . Back surgery  2009    spurs resting on nerve, lumbar  . Total hip arthroplasty Right 09/20/2012    Procedure: TOTAL HIP ARTHROPLASTY;  Surgeon: Kerin Salen, MD;  Location: [facility];  Service: Orthopedics;  Laterality: Right;  . Orif pelvic fracture Right 09/20/2012    Procedure: OPEN REDUCTION INTERNAL FIXATION (ORIF) PELVIC FRACTURE;  Surgeon: Kerin Salen, MD;  Location: [facility];  Service: Orthopedics;  Laterality: Right;  . Bone spurs removed form spine 2007  2007  . Kidney surgery  05/2014    growth removed    Her Family History Is Significant For: Family History  Problem Relation Age of Onset  . Breast cancer Maternal Grandmother   . Arthritis Maternal Grandmother   . Hypertension Other   . Osteoporosis Other   . Breast cancer Mother     had mastectomy in 1982  . Heart disease Mother   . Stroke Mother     mild, 2001  . Hypertension Mother   . Osteoporosis Mother     also had spine surgery 1987, & hip repalcement  . Arthritis Sister   . Hypertension Sister   . Parkinsonism  Maternal Uncle   . Arthritis Maternal Grandfather     Her Social History Is Significant For: History   Social History  . Marital Status: Widowed    Spouse Name: N/A  . Number of Children: 2  . Years of Education: 12   Occupational History  .      retired   Social History Main Topics  . Smoking status: Never Smoker   . Smokeless tobacco: Never Used  . Alcohol Use: 1.2 oz/week    2 Glasses of wine per week     Comment: glass of wine, occas.  . Drug Use: No  . Sexual Activity: No   Other Topics Concern  . None   Social History Narrative   Patient is right handed, resides alone    Her Allergies Are:  Allergies  Allergen Reactions  . Gabapentin Other (See Comments)    sleepy  . Tramadol Other (See Comments)    Confusion and sleepy  :   Her Current Medications Are:  Outpatient Encounter Prescriptions as of 11/07/2014  Medication Sig  . Acetaminophen (TYLENOL PO) Take by mouth daily. 1 tablet daily  . alendronate (FOSAMAX) 70 MG tablet Take 70 mg by mouth every Saturday. Take with a full glass of water on an empty stomach.  Marland Kitchen aspirin 81 MG chewable tablet Chew 81 mg by mouth every morning.  . Calcium Carbonate-Vitamin D (CALCIUM + D PO) Take 1 tablet by mouth 2 (two) times daily. 658m  . carbidopa-levodopa (SINEMET IR) 25-100 MG per tablet Take 1 tablet by mouth 3 (three) times daily.  . diclofenac sodium (VOLTAREN) 1 % GEL Apply 2 g topically 4 (four) times daily as needed (For pain.).  .Marland Kitchenmeloxicam (MOBIC) 15 MG tablet 2 (two) times daily.   .Marland KitchenMYRBETRIQ 50 MG TB24 tablet Take 50 mg by mouth daily.   . pantoprazole (PROTONIX) 40 MG tablet Take 1 tablet (40 mg total) by mouth daily.  . polyvinyl alcohol (LIQUIFILM TEARS) 1.4 % ophthalmic solution Place 1 drop into both eyes daily as needed for dry eyes.  . predniSONE (STERAPRED UNI-PAK) 5 MG TABS tablet Take by mouth.  . rasagiline (AZILECT) 1 MG TABS tablet Take 1 mg by mouth  every morning.  :  Review of Systems:   Out of a complete 14 point review of systems, all are reviewed and negative with the exception of these symptoms as listed below:   Review of Systems  Musculoskeletal: Positive for back pain.       Knee pain     Objective:  Neurologic Exam  Physical Exam Physical Examination:   Filed Vitals:   11/07/14 1304  BP: 174/81  Pulse: 92  Resp: 16   General Examination: The patient is a very pleasant 79 y.o. female in no acute distress.  HEENT: Normocephalic, atraumatic, pupils are equal, round and reactive to light and accommodation. Extraocular tracking shows mild saccadic breakdown without nystagmus noted. Funduscopy is normal. There is no limitation to her gaze. There is mild decrease in eye blink rate. Hearing is intact. Face is symmetric with mild facial masking and normal facial sensation. There is no lip, neck or jaw tremor. Neck is mildly rigid with intact passive ROM. There are no carotid bruits on auscultation. Oropharynx exam reveals mild mouth dryness. No significant airway crowding is noted. Mallampati is class II. Tongue protrudes centrally and palate elevates symmetrically. There is no drooling.   Chest: is clear to auscultation without wheezing, rhonchi or crackles noted.  Heart: sounds are regular and normal without murmurs, rubs or gallops noted.   Abdomen: is soft, non-tender and non-distended with normal bowel sounds appreciated on auscultation.  Extremities: There is less edema today in the distal lower extremities bilaterally, and she is wearing her compression stockings today.    Skin: is warm and dry with no trophic changes noted.  Musculoskeletal: exam reveals: b/l LBP, and L knee pain.   Neurologically:  Mental status: The patient is awake and alert, paying good  attention. She is able to completely provide the history. She is oriented to: person, place, situation, day of week, month of year and year. Her memory, attention, language and knowledge are intact.  There is no aphasia, agnosia, apraxia or anomia. There is a no bradyphrenia. Speech is mildly hypophonic with no dysarthria noted. Mood is congruent and affect is normal.   Cranial nerves are as described above under HEENT exam. In addition, shoulder shrug is normal with equal shoulder height noted.  Motor exam: Normal bulk, and strength for age is noted. There are no dyskinesias noted. Tone is mildly rigid with absence of cogwheeling in the bilateral extremities. There is overall mild bradykinesia. There is no drift or rebound. There is no tremor. Romberg is negative. She brought her 2 wheeled walker. Reflexes are 1+ in the upper extremities and 1+ in the lower extremities. Fine motor skills exam reveals: Finger taps are mildly impaired on the right and mildly impaired on the left. Hand movements are mildly impaired on the right and mildly impaired on the left. RAP (rapid alternating patting) is mildly impaired on the right and moderately impaired on the left. Foot taps are mildly impaired on the right and moderately impaired on the left. Foot agility (in the form of heel stomping) is mildly impaired on the right and mildly impaired on the left.    Cerebellar testing shows no dysmetria or intention tremor on finger to nose testing. Heel to shin is very limited due to knee pain.    Sensory exam is intact to light touch, PP, temperature sense and vibration sense in the UEs and LEs, but she does report some burning sensation and tingling at the bottom of her feet.  Gait, station and balance exam: She stands up with moderate difficulty, has to push herself up and has a moderately stooped posture with kyphoscoliosis and leans to the L, this may be a little bit worse than last time. She uses her 2 wheeled walker well. She has a slight limp on the R. She turns in 3 steps and tends to turn a little fast. Balance is mildly impaired.    Assessment and Plan:   In summary, Dawn Ashley is a very pleasant  79 year old female with a history of left-sided predominant Parkinson's disease diagnosed in 2006 but with symptoms dating back to a few years prior to that. She has remained fairly stable  Parkinson's wise and her lower extremity swelling is  overall better. She is now off of Mirapex altogether. She has been able to tolerate Sinemet 1 pill 3 times a day. She has difficulty remembering to take it one hour before meals. She will continue Azilect 1 mg once daily. I have asked her today that she take the Sinemet one or 2 hours after her meals. In addition, she is reminded to use her walker at all times as she has a little bit better posture when she uses her walker. She is primarily plagued by pain, particularly bilateral low back pain and left knee pain. She had a knee steroid injection recently and is currently on Motrin take 7.5 mg twice daily as well as a prednisone oral taper through orthopedics. She is currently not in physical therapy any longer. I made the adjustment on the Sinemet prescription and discontinued her Mirapex prescription long-term. I gave her written instructions and highlighted those for her so she can share it with the staff at [facility]. I would like to see her back in a few months, sooner if the need arises. She did not need prescription refills. She is advised to continue taking her PPI regularly. She has had some recent indigestion. I spent 40 minutes in total face-to-face time with the patient, more than 50% of which was spent in counseling and coordination of care, reviewing test results, reviewing medication and discussing or reviewing the diagnosis of PD, memory loss, arthritis, the prognosis and treatment options.

## 2014-11-07 NOTE — Patient Instructions (Signed)
We will continue your Sinemet (generic name: carbidopa-levodopa) 25/100 mg: Take 1 pill 3 times a day, try to take it 1-2 hours after your meals.  Use your walker at all times.  Drink plenty of water.

## 2014-11-20 ENCOUNTER — Other Ambulatory Visit: Payer: Self-pay | Admitting: Internal Medicine

## 2014-12-18 ENCOUNTER — Encounter: Payer: Self-pay | Admitting: Podiatry

## 2014-12-18 ENCOUNTER — Ambulatory Visit (INDEPENDENT_AMBULATORY_CARE_PROVIDER_SITE_OTHER): Payer: Medicare Other | Admitting: Podiatry

## 2014-12-18 VITALS — BP 120/68 | HR 75 | Resp 18

## 2014-12-18 DIAGNOSIS — L603 Nail dystrophy: Secondary | ICD-10-CM

## 2014-12-18 DIAGNOSIS — L601 Onycholysis: Secondary | ICD-10-CM | POA: Diagnosis not present

## 2014-12-18 DIAGNOSIS — G629 Polyneuropathy, unspecified: Secondary | ICD-10-CM | POA: Diagnosis not present

## 2014-12-18 NOTE — Progress Notes (Signed)
   Subjective:    Patient ID: Dawn Ashley, female    DOB: 1935/08/09, 79 y.o.   MRN: [id number]  HPI  79 year old female presents the office today with complaints of left big toe nail pain. She states that periodically she gets some redness around the toenail however she denies any recently and denies any swelling or drainage from the toenail. Denies any history of injury or trauma. She also states intermittent and she gets numbness to her toes. She does also relate that recently she's had significant back issues in the issues. She says the numbness has not worsened recently. No other complaints at this time.    Review of Systems  All other systems reviewed and are negative.      Objective:   Physical Exam AAO 3, NAD DP/PT pulses palpable, CRT less than 3 seconds Protective sensation intact with Simms once the monofilament, vibratory sensation intact, Achilles tendon reflex intact. Left hallux nail is hypertrophic, dystrophic, discolored is loose and the underlying nailbed distally however it is adhered on the proximal nail border. This tendinous palpation of on the nail. There is no swelling erythema, edema, drainage/purulence. Upon debridement of the nail there was resolution of symptoms. The remaining nails are also slightly dystrophic, discolored without any tenderness palpation and no swelling erythema or drainage. No areas of pinpoint bony tenderness or pain the vibratory sensation of bilateral lower extra beats. No open wound edema, erythema, increase in warmth bilaterally. MMT 5/5, ROM WNL No open lesions or pre-ulcerative lesions.  No pain with calf compression, swelling, warmth, erythema.      Assessment & Plan:  79 year old female left hallux onychodystrophy, onycholysis; possible early neuropathy -Treatment options were discussed include alternatives, risks, complications. -Left hallux nail sharply debrided without complications to patient comfort. The remaining nails are  also debrided without competitions/bleeding -Strongly recommended her to follow-up with her other physicians in regard to the back as this may be causing the numbness in her feet. If they do not believe so we'll further investigate. -Follow-up in the near future. Call with any questions/concerns/change in symptoms.

## 2014-12-28 ENCOUNTER — Ambulatory Visit (INDEPENDENT_AMBULATORY_CARE_PROVIDER_SITE_OTHER): Payer: Medicare Other | Admitting: Internal Medicine

## 2014-12-28 ENCOUNTER — Encounter: Payer: Self-pay | Admitting: Internal Medicine

## 2014-12-28 VITALS — BP 160/72 | HR 90 | Temp 98.3°F | Resp 14 | Wt 126.8 lb

## 2014-12-28 DIAGNOSIS — S32010S Wedge compression fracture of first lumbar vertebra, sequela: Secondary | ICD-10-CM | POA: Diagnosis not present

## 2014-12-28 DIAGNOSIS — M8080XS Other osteoporosis with current pathological fracture, unspecified site, sequela: Secondary | ICD-10-CM

## 2014-12-28 MED ORDER — LIDOCAINE 5 % EX PTCH: 1.0000 | MEDICATED_PATCH | CUTANEOUS | Status: DC

## 2014-12-28 NOTE — Progress Notes (Signed)
   Subjective:    Patient ID: Dawn Ashley, female    DOB: [birth year], 79 y.o.   MRN: [id number]  HPI The patient is a 79 YO female who is coming in for back and leg pain. She has had compression fracture in her low back and this is still in significant pain. She has tried tramadol, voltaren gel, mobic which have not been helpful. She is also taking tylenol which is not helping either. She recently had MRI of her back from her orthopedic doctor which showed some healing but space loss in the back. Still with pains shooting down her left leg. Has affected her balance and she has fallen. Denies serious injury from the fall and did not seek care.   Review of Systems  Constitutional: Positive for activity change. Negative for fever, appetite change and fatigue.  Respiratory: Negative.   Cardiovascular: Negative.   Gastrointestinal: Negative.   Musculoskeletal: Positive for back pain, arthralgias and gait problem.  Neurological: Negative.       Objective:   Physical Exam  Constitutional: She appears well-developed and well-nourished.  HENT:  Head: Normocephalic and atraumatic.  Cardiovascular: Normal rate and regular rhythm.   Pulmonary/Chest: Effort normal.  Abdominal: Soft.  Musculoskeletal:  Tenderness in the lower back with radiation into the left leg and knee.   Neurological: She is alert. Coordination abnormal.  Uses walker for stability   Filed Vitals:   12/28/14 1328  BP: 160/72  Pulse: 90  Temp: 98.3 F (36.8 C)  TempSrc: Oral  Resp: 14  Weight: 126 lb 12.8 oz (57.516 kg)  SpO2: 97%      Assessment & Plan:

## 2014-12-28 NOTE — Assessment & Plan Note (Signed)
Still taking the fosamax at this time without side effects or complications. Has had 2 compression fractures in the back.

## 2014-12-28 NOTE — Patient Instructions (Signed)
We have sent in the lidoderm patches to your pharmacy and you put those where the pain is. Replace the patch every 12 hours.   We may need to do a prior authorization for the patches but they should be approved.   Hopefully the back will continue healing and getting better.

## 2014-12-28 NOTE — Progress Notes (Signed)
Pre visit review using our clinic review tool, if applicable. No additional management support is needed unless otherwise documented below in the visit note. 

## 2014-12-28 NOTE — Assessment & Plan Note (Signed)
Now with compression fracture and seeing orthopedics for this. Will try lidoderm patches for pain. She is on fosamax still. Has tried and failed meloxicam, tramadol, voltaren gel. Heating pads also do not help. This is likely also the cause of her knee pain as the steroid injection was not helpful.

## 2014-12-29 ENCOUNTER — Other Ambulatory Visit: Payer: Self-pay | Admitting: Geriatric Medicine

## 2014-12-29 MED ORDER — LIDOCAINE 5 % EX PTCH: 1.0000 | MEDICATED_PATCH | CUTANEOUS | Status: DC

## 2015-01-02 ENCOUNTER — Other Ambulatory Visit: Payer: Self-pay | Admitting: Geriatric Medicine

## 2015-01-02 ENCOUNTER — Telehealth: Payer: Self-pay | Admitting: Internal Medicine

## 2015-01-02 MED ORDER — LIDOCAINE 5 % EX PTCH: 1.0000 | MEDICATED_PATCH | CUTANEOUS | Status: DC

## 2015-01-02 NOTE — Telephone Encounter (Signed)
PHARMACY CALLED TO ADVISE THAT THEY ARE FAXING PA REQUEST TO [facility]. ADVISED TO TRY AGAIN. SHE IS FAXING AGAIN

## 2015-01-02 NOTE — Telephone Encounter (Signed)
Called and spoke with patient. I have not received a PA request. I resubmitted medication to pharmacy.

## 2015-01-02 NOTE — Telephone Encounter (Signed)
Is requesting call back in regards to PA process on Pain patch.

## 2015-01-02 NOTE — Telephone Encounter (Signed)
Pt called in and has some questions about this PA and also wants to know which meds she is suppose to be taking?

## 2015-01-03 ENCOUNTER — Telehealth: Payer: Self-pay | Admitting: Geriatric Medicine

## 2015-01-03 DIAGNOSIS — S32010A Wedge compression fracture of first lumbar vertebra, initial encounter for closed fracture: Secondary | ICD-10-CM

## 2015-01-03 MED ORDER — OXYCODONE-ACETAMINOPHEN 7.5-325 MG PO TABS: 1.0000 | ORAL_TABLET | ORAL | Status: DC | PRN

## 2015-01-03 NOTE — Telephone Encounter (Signed)
Rx for percocet printed and signed by Dr. Ronnald Ramp. I called patient and left her a message informing her that she could come and pick up.

## 2015-01-03 NOTE — Telephone Encounter (Signed)
Try percocet

## 2015-01-03 NOTE — Telephone Encounter (Signed)
I spoke with patient. PA was denied. I am seeing if another provider will advise on an alternative because Dr. Doug Sou is out.

## 2015-01-03 NOTE — Telephone Encounter (Signed)
Patient came in to see Dr. Doug Sou on 12/28/14. She has a compression fracture of her lumbar vertebra. She has been taking Tramadol 50 mg 1/2 in the morning and 1/2 in the evening. She told Dr. Doug Sou that is is not helping with her pain. Dr. Doug Sou prescribed Lidocaine patches, but they were denied by her insurance company. She has tried other medications for her back pain and they are listed in Dr. Donneta Romberg progess note dated 12/28/14. Dr. Doug Sou is on vacation, would you be willing to advise on other medication options for pain management? Please advise, thanks.

## 2015-01-09 ENCOUNTER — Ambulatory Visit: Payer: Medicare Other | Admitting: Internal Medicine

## 2015-01-12 ENCOUNTER — Ambulatory Visit (INDEPENDENT_AMBULATORY_CARE_PROVIDER_SITE_OTHER): Payer: Medicare Other | Admitting: Internal Medicine

## 2015-01-12 ENCOUNTER — Encounter: Payer: Self-pay | Admitting: Internal Medicine

## 2015-01-12 VITALS — BP 138/80 | HR 95 | Temp 97.7°F | Resp 16 | Wt 126.1 lb

## 2015-01-12 DIAGNOSIS — IMO0002 Reserved for concepts with insufficient information to code with codable children: Secondary | ICD-10-CM

## 2015-01-12 DIAGNOSIS — T148 Other injury of unspecified body region: Secondary | ICD-10-CM

## 2015-01-12 DIAGNOSIS — S32010A Wedge compression fracture of first lumbar vertebra, initial encounter for closed fracture: Secondary | ICD-10-CM

## 2015-01-12 MED ORDER — PREGABALIN 75 MG PO CAPS: 75.0000 mg | ORAL_CAPSULE | Freq: Two times a day (BID) | ORAL | Status: DC

## 2015-01-12 NOTE — Progress Notes (Signed)
   Subjective:    Patient ID: Dawn Ashley, female    DOB: 11-May-1936, 79 y.o.   MRN: [id number]  HPI The patient is a 79 YO female who is coming in today for back pain. She was unable to get the lidoderm patch and has not been helped by tramadol. She also tried percocet which was not helpful and constipated her. She then stopped taking it and is working on getting her bowels back to normal. The pain is with a compression fracture and she wonders if she should see a back specialist. With shooting pains to her left leg especially but to both. Has not gotten any relief. She is on boniva. Has had several compression fractures in the past. No weakness in her legs. No loss or bowel or bladder.   Review of Systems  Constitutional: Positive for activity change. Negative for fever, appetite change and fatigue.  Respiratory: Negative.   Cardiovascular: Negative.   Gastrointestinal: Negative.   Musculoskeletal: Positive for back pain, arthralgias and gait problem.  Neurological: Negative.       Objective:   Physical Exam  Constitutional: She appears well-developed and well-nourished.  HENT:  Head: Normocephalic and atraumatic.  Cardiovascular: Normal rate and regular rhythm.   Pulmonary/Chest: Effort normal.  Abdominal: Soft.  Musculoskeletal:  Tenderness in the lower back with radiation into the left leg and knee.   Neurological: She is alert. Coordination abnormal.  Uses walker for stability   Filed Vitals:   01/12/15 1419  BP: 138/80  Pulse: 95  Temp: 97.7 F (36.5 C)  TempSrc: Oral  Resp: 16  Weight: 126 lb 1.9 oz (57.208 kg)  SpO2: 96%      Assessment & Plan:

## 2015-01-12 NOTE — Patient Instructions (Signed)
We will try a medicine for nerve pain to block some of the pain signals called lyrica. You can take 1 pill twice a day. The most common side effects are dizziness and nausea and tiredness.   We will also get you in to the spine doctor as soon as possible

## 2015-01-12 NOTE — Progress Notes (Signed)
Pre visit review using our clinic review tool, if applicable. No additional management support is needed unless otherwise documented below in the visit note. 

## 2015-01-12 NOTE — Assessment & Plan Note (Signed)
Will try lyrica for pain especially since her main pain is from radiation into the legs. She has not done well on opiates and would try to avoid since they are not working. Referral for neurosurgery for the fracture to see if she would benefit from any intervention or physical therapy.

## 2015-01-15 ENCOUNTER — Telehealth: Payer: Self-pay | Admitting: Internal Medicine

## 2015-01-15 NOTE — Telephone Encounter (Signed)
Patient called wanting to remind you about getting an approval for her lyrica

## 2015-01-16 ENCOUNTER — Other Ambulatory Visit: Payer: Self-pay | Admitting: Radiology

## 2015-01-16 ENCOUNTER — Other Ambulatory Visit (HOSPITAL_COMMUNITY): Payer: Self-pay | Admitting: Interventional Radiology

## 2015-01-16 ENCOUNTER — Ambulatory Visit: Payer: Medicare Other | Admitting: Internal Medicine

## 2015-01-16 DIAGNOSIS — N2889 Other specified disorders of kidney and ureter: Secondary | ICD-10-CM

## 2015-01-18 ENCOUNTER — Telehealth: Payer: Self-pay | Admitting: Internal Medicine

## 2015-01-18 ENCOUNTER — Encounter: Payer: Self-pay | Admitting: Radiology

## 2015-01-18 NOTE — Telephone Encounter (Signed)
I have worked with a lot of the [facility] ENT doctors and feel that all of them do a good job.

## 2015-01-18 NOTE — Telephone Encounter (Signed)
Patient is inquiring on your opinion of a good ENT or audiologist in the [facility] system

## 2015-01-19 ENCOUNTER — Ambulatory Visit: Payer: Medicare Other | Admitting: Internal Medicine

## 2015-01-26 LAB — CREATININE WITH EST GFR
Creat: 0.84 mg/dL (ref 0.50–1.10)
GFR, EST AFRICAN AMERICAN: 78 mL/min
GFR, EST NON AFRICAN AMERICAN: 67 mL/min

## 2015-01-26 LAB — BUN: BUN: 29 mg/dL — ABNORMAL HIGH (ref 6–23)

## 2015-02-09 ENCOUNTER — Other Ambulatory Visit: Payer: Self-pay | Admitting: Cardiology

## 2015-02-09 ENCOUNTER — Telehealth: Payer: Self-pay | Admitting: *Deleted

## 2015-02-09 ENCOUNTER — Encounter (HOSPITAL_COMMUNITY): Payer: Self-pay | Admitting: Emergency Medicine

## 2015-02-09 ENCOUNTER — Emergency Department (HOSPITAL_COMMUNITY)
Admission: EM | Admit: 2015-02-09 | Discharge: 2015-02-09 | Disposition: A | Discharge status: Home / Self Care | Payer: Medicare Other | Attending: Emergency Medicine | Admitting: Emergency Medicine

## 2015-02-09 DIAGNOSIS — R011 Cardiac murmur, unspecified: Secondary | ICD-10-CM | POA: Insufficient documentation

## 2015-02-09 DIAGNOSIS — Z87442 Personal history of urinary calculi: Secondary | ICD-10-CM | POA: Diagnosis not present

## 2015-02-09 DIAGNOSIS — Z79899 Other long term (current) drug therapy: Secondary | ICD-10-CM | POA: Diagnosis not present

## 2015-02-09 DIAGNOSIS — S32010A Wedge compression fracture of first lumbar vertebra, initial encounter for closed fracture: Secondary | ICD-10-CM | POA: Diagnosis present

## 2015-02-09 DIAGNOSIS — R079 Chest pain, unspecified: Secondary | ICD-10-CM | POA: Diagnosis not present

## 2015-02-09 DIAGNOSIS — K219 Gastro-esophageal reflux disease without esophagitis: Secondary | ICD-10-CM | POA: Diagnosis present

## 2015-02-09 DIAGNOSIS — G8929 Other chronic pain: Secondary | ICD-10-CM | POA: Insufficient documentation

## 2015-02-09 DIAGNOSIS — M1990 Unspecified osteoarthritis, unspecified site: Secondary | ICD-10-CM | POA: Insufficient documentation

## 2015-02-09 DIAGNOSIS — I4891 Unspecified atrial fibrillation: Secondary | ICD-10-CM | POA: Diagnosis present

## 2015-02-09 DIAGNOSIS — G20 Parkinson's disease: Secondary | ICD-10-CM | POA: Insufficient documentation

## 2015-02-09 DIAGNOSIS — M545 Low back pain: Secondary | ICD-10-CM | POA: Insufficient documentation

## 2015-02-09 DIAGNOSIS — G20A1 Parkinson's disease without dyskinesia, without mention of fluctuations: Secondary | ICD-10-CM | POA: Diagnosis present

## 2015-02-09 DIAGNOSIS — Z7982 Long term (current) use of aspirin: Secondary | ICD-10-CM | POA: Insufficient documentation

## 2015-02-09 HISTORY — DX: Unspecified atrial fibrillation: I48.91

## 2015-02-09 LAB — BASIC METABOLIC PANEL
ANION GAP: 6 (ref 5–15)
BUN: 31 mg/dL — ABNORMAL HIGH (ref 6–20)
CHLORIDE: 110 mmol/L (ref 101–111)
CO2: 26 mmol/L (ref 22–32)
CREATININE: 0.86 mg/dL (ref 0.44–1.00)
Calcium: 9.3 mg/dL (ref 8.9–10.3)
GFR calc Af Amer: >60 mL/min (ref >60)
GFR calc non Af Amer: >60 mL/min (ref >60)
Glucose, Bld: 104 mg/dL — ABNORMAL HIGH (ref 65–99)
Potassium: 3.9 mmol/L (ref 3.5–5.1)
Sodium: 142 mmol/L (ref 135–145)

## 2015-02-09 LAB — CBC
HCT: 32.8 % — ABNORMAL LOW (ref 36.0–46.0)
HEMOGLOBIN: 10 g/dL — AB (ref 12.0–15.0)
MCH: 27.9 pg (ref 26.0–34.0)
MCHC: 30.5 g/dL (ref 30.0–36.0)
MCV: 91.6 fL (ref 78.0–100.0)
Platelets: 144 10*3/uL — ABNORMAL LOW (ref 150–400)
RBC: 3.58 MIL/uL — ABNORMAL LOW (ref 3.87–5.11)
RDW: 15 % (ref 11.5–15.5)
WBC: 5.7 10*3/uL (ref 4.0–10.5)

## 2015-02-09 LAB — I-STAT TROPONIN, ED: Troponin i, poc: 0.02 ng/mL (ref 0.00–0.08)

## 2015-02-09 MED ORDER — METOPROLOL TARTRATE 25 MG PO TABS
25.0000 mg | ORAL_TABLET | Freq: Once | ORAL | Status: AC
  Administered 2015-02-09: 25 mg via ORAL
  Filled 2015-02-09: qty 1

## 2015-02-09 MED ORDER — METOPROLOL TARTRATE 25 MG PO TABS: 25.0000 mg | ORAL_TABLET | Freq: Four times a day (QID) | ORAL | Status: DC

## 2015-02-09 MED ORDER — DILTIAZEM HCL 25 MG/5ML IV SOLN
15.0000 mg | Freq: Once | INTRAVENOUS | Status: AC
  Administered 2015-02-09: 15 mg via INTRAVENOUS
  Filled 2015-02-09: qty 5

## 2015-02-09 NOTE — H&P (Signed)
Patient ID: Dawn Ashley MRN: [id number], DOB/AGE: 77-Jun-1937   Admit date: 02/09/2015   Primary Physician: Olga Millers, MD Primary Cardiologist: New  HPI: Pleasant 79 y/o female with no prior history of arrhythmia, palpitations, or prior cardiac evaluation except for prior in in 2013 for edema, and 2014 for "murmur". Echo showed an EF of 60% with mild LVH in 2014. She has Parkinson's and lives at [facility] assisted living. She was at her urologist office for a routine check up when it was noted her heart rate was irregular and fast. She was sent to the ED at [facility] where EKG confirmed AF with RVR. Her rate come down to 60 after a Diltiazem bolus but has drifted back up to 110. She is asymptomatic and unaware of her AF.    Problem List: Past Medical History  Diagnosis Date  . Parkinson disease 01/2005 dx  . Back pain   . Kidney stones   . Heart murmur     h/o MVP, pt. reports murmur is almost resolved  . Arthritis     osteoporosis, osteoarthritis   . Swelling     legs / feet - takes Lasix to control  . GERD (gastroesophageal reflux disease)   . Renal mass, right   . Frequency of urination   . MVP (mitral valve prolapse)   . Atrial fibrillation 02/09/15    Past Surgical History  Procedure Laterality Date  . Tonsillectomy    . Cataract extraction w/ intraocular lens  implant, bilateral  2011  2004  . Dilation and curettage of uterus  2012  . Back surgery  2009    spurs resting on nerve, lumbar  . Total hip arthroplasty Right 09/20/2012    Procedure: TOTAL HIP ARTHROPLASTY;  Surgeon: Kerin Salen, MD;  Location: [place];  Service: Orthopedics;  Laterality: Right;  . Orif pelvic fracture Right 09/20/2012    Procedure: OPEN REDUCTION INTERNAL FIXATION (ORIF) PELVIC FRACTURE;  Surgeon: Kerin Salen, MD;  Location: [place];  Service: Orthopedics;  Laterality: Right;  . Bone spurs removed form spine 2007  2007  . Kidney surgery  05/2014    growth removed     Allergies:    Allergies  Allergen Reactions  . Gabapentin Other (See Comments)    sleepy  . Tramadol Other (See Comments)    Confusion and sleepy     Home Medications No current facility-administered medications for this encounter.   Current Outpatient Prescriptions  Medication Sig Dispense Refill  . aspirin 81 MG chewable tablet Chew 81 mg by mouth every morning.    . Calcium Carbonate-Vitamin D (CALCIUM + D PO) Take 1 tablet by mouth 2 (two) times daily. 630mg     . carbidopa-levodopa (SINEMET IR) 25-100 MG per tablet Take 1 tablet by mouth 3 (three) times daily. 270 tablet 3  . diclofenac sodium (VOLTAREN) 1 % GEL Apply 2 g topically 4 (four) times daily as needed (For pain.).    Marland Kitchen MYRBETRIQ 50 MG TB24 tablet Take 50 mg by mouth daily.     . polyvinyl alcohol (LIQUIFILM TEARS) 1.4 % ophthalmic solution Place 1 drop into both eyes daily as needed for dry eyes.    . pregabalin (LYRICA) 75 MG capsule Take 1 capsule (75 mg total) by mouth 2 (two) times daily. 60 capsule 0  . alendronate (FOSAMAX) 70 MG tablet TAKE 1 TABLET EVERY SATURDAY AT 6 P.M. TAKE WITH A FULL GLASS OF WATER ON AN EMPTY STOMACH  12 tablet 3  . amoxicillin (AMOXIL) 500 MG capsule Before dental appt    . lidocaine (LIDODERM) 5 % Place 1 patch onto the skin daily. Remove & Discard patch within 12 hours or as directed by MD (Patient not taking: Reported on 01/12/2015) 60 patch 0  . pantoprazole (PROTONIX) 40 MG tablet Take 1 tablet (40 mg total) by mouth daily. (Patient not taking: Reported on 02/09/2015) 30 tablet 3     Family History  Problem Relation Age of Onset  . Breast cancer Maternal Grandmother   . Arthritis Maternal Grandmother   . Hypertension Other   . Osteoporosis Other   . Breast cancer Mother     had mastectomy in 1982  . Heart disease Mother   . Stroke Mother     mild, 2001  . Hypertension Mother   . Osteoporosis Mother     also had spine surgery 1987, & hip repalcement  . Arthritis Sister   . Hypertension  Sister   . Parkinsonism Maternal Uncle   . Arthritis Maternal Grandfather      History   Social History  . Marital Status: Widowed    Spouse Name: N/A  . Number of Children: 2  . Years of Education: 12   Occupational History  .      retired   Social History Main Topics  . Smoking status: Never Smoker   . Smokeless tobacco: Never Used  . Alcohol Use: 1.2 oz/week    2 Glasses of wine per week     Comment: glass of wine, occas.  . Drug Use: No  . Sexual Activity: No   Other Topics Concern  . Not on file   Social History Narrative   Patient is right handed, resides alone     Review of Systems: General: negative for chills, fever, night sweats or weight changes.  Cardiovascular: negative for, dyspnea on exertion, edema, orthopnea, palpitations, paroxysmal nocturnal dyspnea or shortness of breath Pt did complain of two episodes of SSCP at night. Last one was two weeks ago. Lasted 5 minutes, no radiation, diaphoresis, or SOB. Dermatological: negative for rash Respiratory: negative for cough or wheezing Urologic: negative for hematuria Abdominal: negative for nausea, vomiting, diarrhea, bright red blood per rectum, melena, or hematemesis Neurologic: negative for visual changes, syncope, or dizziness All other systems reviewed and are otherwise negative except as noted above.  Physical Exam: Blood pressure 121/66, pulse 130, temperature 97.7 F (36.5 C), temperature source Oral, resp. rate 20, SpO2 97 %.  General appearance: alert, cooperative, no distress and pale Neck: no carotid bruit and no JVD Lungs: kyphosis, decreased breath sounds Rt base Heart: irregularly irregular rhythm Abdomen: soft, non-tender; bowel sounds normal; no masses,  no organomegaly Extremities: trace edema Pulses: 2+ and symmetric Skin: pale cool dry Neurologic: Grossly normal    Labs:   Results for orders placed or performed during the hospital encounter of 02/09/15 (from the past 24  hour(s))  CBC     Status: Abnormal   Collection Time: 02/09/15 11:05 AM  Result Value Ref Range   WBC 5.7 4.0 - 10.5 K/uL   RBC 3.58 (L) 3.87 - 5.11 MIL/uL   Hemoglobin 10.0 (L) 12.0 - 15.0 g/dL   HCT 32.8 (L) 36.0 - 46.0 %   MCV 91.6 78.0 - 100.0 fL   MCH 27.9 26.0 - 34.0 pg   MCHC 30.5 30.0 - 36.0 g/dL   RDW 15.0 11.5 - 15.5 %   Platelets 144 (L) 150 -  400 K/uL  Basic metabolic panel     Status: Abnormal   Collection Time: 02/09/15 11:05 AM  Result Value Ref Range   Sodium 142 135 - 145 mmol/L   Potassium 3.9 3.5 - 5.1 mmol/L   Chloride 110 101 - 111 mmol/L   CO2 26 22 - 32 mmol/L   Glucose, Bld 104 (H) 65 - 99 mg/dL   BUN 31 (H) 6 - 20 mg/dL   Creatinine, Ser 0.86 0.44 - 1.00 mg/dL   Calcium 9.3 8.9 - 10.3 mg/dL   GFR calc non Af Amer >60 >60 mL/min   GFR calc Af Amer >60 >60 mL/min   Anion gap 6 5 - 15  I-stat troponin, ED     Status: None   Collection Time: 02/09/15 11:08 AM  Result Value Ref Range   Troponin i, poc 0.02 0.00 - 0.08 ng/mL   Comment 3             Radiology/Studies: No results found.  EKG:AF, LVH by volts  ASSESSMENT AND PLAN:  Principal Problem:   Atrial fibrillation with RVR- new onset, unknown duration (last EKG in Epic was 2013- NSR).    CHADs VASc = 3 for age,sex   Active Problems:   History of recent chest pain that could be angina   Parkinson disease- [facility] follows   Compression fracture of L1 - injected two weeks ago, still having lots of pain issues   GERD (gastroesophageal reflux disease)   PLAN: Will review with MD. I gave her one dose of Lopressor 25 mg. If her rate comes down we could consider sending her home on PO Lopressor, and (?) Eliquis,  and continue work up (echo, [employer]) as an OP.    SignedErlene Quan, PA-C 02/09/2015, 1:44 PM [phone]  History and all data above reviewed.  Patient examined.  I agree with the findings as above.  The patient has atrial fib without any symptoms.  She did have some  vague chest pain about two weeks ago.  The patient denies any new symptoms such as chest discomfort, neck or arm discomfort. There has been no new shortness of breath, PND or orthopnea. There have been no reported palpitations, presyncope or syncope.  She complaints of leg and back pain. The patient exam reveals [id number]  ,  Lungs: Clear  ,  Abd: Positive bowel sounds, no rebound no guarding, Ext No edema  .  All available labs, radiology testing, previous records reviewed. Agree with documented assessment and plan. Atrial fib:  No contraindication to NOAC.  I would suggest Eliquis.  She does have anemia but this looks to be chronic and she has no active bleeding.  She should have follow up CBC in about a week.  She should get an out patient echo.  I will consider a Lexiscan Myoview later.  For rate control I would start Metoprolol 25 mg qid.  She will keep her heart rate recorded.  I talked to her and her son about this.  She can get a pulse ox to keep her heart rate recorded.  She will need have follow up early next week in clinic to reassess. I discussed this at length with the patient and her son.    Jeneen Rinks Elek Holderness  3:01 PM  02/09/2015

## 2015-02-09 NOTE — ED Notes (Signed)
Pt from [facility]. Pt sent over from urology to have EKG,went to urology for yearly check up. pt states Dr states heart was beating abnormal. Pt denies chest pain, SOB, heart flutter, lightheaded, dizziness.

## 2015-02-09 NOTE — ED Provider Notes (Signed)
Pt sent in from the urologists office for possible irregular heart beat.  She was there for a routine visit.  She denies any complaints of chest pain, shortness of breath, weakness, palpitations Physical Exam  BP 138/90 mmHg  Pulse 128  Temp(Src) 97.7 F (36.5 C) (Oral)  Resp 20  SpO2 97%  Physical Exam  Constitutional: She appears well-developed and well-nourished. No distress.  HENT:  Head: Normocephalic and atraumatic.  Right Ear: External ear normal.  Left Ear: External ear normal.  Eyes: Conjunctivae are normal. Right eye exhibits no discharge. Left eye exhibits no discharge. No scleral icterus.  Neck: Neck supple. No tracheal deviation present.  Cardiovascular: An irregularly irregular rhythm present. Tachycardia present.   Pulmonary/Chest: Effort normal. No stridor. No respiratory distress.  Musculoskeletal: She exhibits no edema.  Neurological: She is alert. Cranial nerve deficit: no gross deficits.  Skin: Skin is warm and dry. No rash noted.  Psychiatric: She has a normal mood and affect.  Nursing note and vitals reviewed.   ED Course  Procedures  EKG Interpretation  Date/Time:  Friday February 09 2015 11:00:44 EDT Ventricular Rate:  133 PR Interval:    QRS Duration: 72 QT Interval:  300 QTC Calculation: 446 R Axis:   14 Text Interpretation:  Atrial fibrillation with rapid ventricular response No old tracing to compare Confirmed by Ardena Gangl  MD-J, Dylin Breeden ([id number]) on 02/09/2015 11:07:36 AM       MDM Pt has new onset a fib. Chads2 Vasc score is 3.  Will give dose of cardizem.  Check labs.  Plan on consultation with cardiology to discuss further management.  Pt is asymptomatic and may be a good case for close outpatient follow up.     Dorie Rank, MD 02/09/15 [phone]

## 2015-02-09 NOTE — Telephone Encounter (Signed)
-----   Message from Erlene Quan, [place] sent at 02/09/2015  3:16 PM EDT ----- This pt need to be seen in the office in one week by Dr [facility] or an APP as a post ED f/u.   Thanks, [employer]

## 2015-02-09 NOTE — Discharge Instructions (Signed)
Take metoprolol four times daily as prescribed. Follow up with Dr. Percival Spanish. Return with worsening symptoms.  Atrial Fibrillation Atrial fibrillation is a type of irregular heart rhythm (arrhythmia). During atrial fibrillation, the upper chambers of the heart (atria) quiver continuously in a chaotic pattern. This causes an irregular and often rapid heart rate.  Atrial fibrillation is the result of the heart becoming overloaded with disorganized signals that tell it to beat. These signals are normally released one at a time by a part of the right atrium called the sinoatrial node. They then travel from the atria to the lower chambers of the heart (ventricles), causing the atria and ventricles to contract and pump blood as they pass. In atrial fibrillation, parts of the atria outside of the sinoatrial node also release these signals. This results in two problems. First, the atria receive so many signals that they do not have time to fully contract. Second, the ventricles, which can only receive one signal at a time, beat irregularly and out of rhythm with the atria.  There are three types of atrial fibrillation:   Paroxysmal. Paroxysmal atrial fibrillation starts suddenly and stops on its own within a week.  Persistent. Persistent atrial fibrillation lasts for more than a week. It may stop on its own or with treatment.  Permanent. Permanent atrial fibrillation does not go away. Episodes of atrial fibrillation may lead to permanent atrial fibrillation. Atrial fibrillation can prevent your heart from pumping blood normally. It increases your risk of stroke and can lead to heart failure.  CAUSES   Heart conditions, including a heart attack, heart failure, coronary artery disease, and heart valve conditions.   Inflammation of the sac that surrounds the heart (pericarditis).  Blockage of an artery in the lungs (pulmonary embolism).  Pneumonia or other infections.  Chronic lung disease.  Thyroid  problems, especially if the thyroid is overactive (hyperthyroidism).  Caffeine, excessive alcohol use, and use of some illegal drugs.   Use of some medicines, including certain decongestants and diet pills.  Heart surgery.   Birth defects.  Sometimes, no cause can be found. When this happens, the atrial fibrillation is called lone atrial fibrillation. The risk of complications from atrial fibrillation increases if you have lone atrial fibrillation and you are age 62 years or older. RISK FACTORS  Heart failure.  Coronary artery disease.  Diabetes mellitus.   High blood pressure (hypertension).   Obesity.   Other arrhythmias.   Increased age. SIGNS AND SYMPTOMS   A feeling that your heart is beating rapidly or irregularly.   A feeling of discomfort or pain in your chest.   Shortness of breath.   Sudden light-headedness or weakness.   Getting tired easily when exercising.   Urinating more often than normal (mainly when atrial fibrillation first begins).  In paroxysmal atrial fibrillation, symptoms may start and suddenly stop. DIAGNOSIS  Your health care provider may be able to detect atrial fibrillation when taking your pulse. Your health care provider may have you take a test called an ambulatory electrocardiogram (ECG). An ECG records your heartbeat patterns over a 24-hour period. You may also have other tests, such as:  Transthoracic echocardiogram (TTE). During echocardiography, sound waves are used to evaluate how blood flows through your heart.  Transesophageal echocardiogram (TEE).  Stress test. There is more than one type of stress test. If a stress test is needed, ask your health care provider about which type is best for you.  Chest X-ray exam.  Blood tests.  Computed tomography (CT). TREATMENT  Treatment may include:  Treating any underlying conditions. For example, if you have an overactive thyroid, treating the condition may correct  atrial fibrillation.  Taking medicine. Medicines may be given to control a rapid heart rate or to prevent blood clots, heart failure, or a stroke.  Having a procedure to correct the rhythm of the heart:  Electrical cardioversion. During electrical cardioversion, a controlled, low-energy shock is delivered to the heart through your skin. If you have chest pain, very low blood pressure, or sudden heart failure, this procedure may need to be done as an emergency.  Catheter ablation. During this procedure, heart tissues that send the signals that cause atrial fibrillation are destroyed.  Surgical ablation. During this surgery, thin lines of heart tissue that carry the abnormal signals are destroyed. This procedure can either be an open-heart surgery or a minimally invasive surgery. With the minimally invasive surgery, small cuts are made to access the heart instead of a large opening.  Pulmonary venous isolation. During this surgery, tissue around the veins that carry blood from the lungs (pulmonary veins) is destroyed. This tissue is thought to carry the abnormal signals. HOME CARE INSTRUCTIONS   Take medicines only as directed by your health care provider. Some medicines can make atrial fibrillation worse or recur.  If blood thinners were prescribed by your health care provider, take them exactly as directed. Too much blood-thinning medicine can cause bleeding. If you take too little, you will not have the needed protection against stroke and other problems.  Perform blood tests at home if directed by your health care provider. Perform blood tests exactly as directed.  Quit smoking if you smoke.  Do not drink alcohol.  Do not drink caffeinated beverages such as coffee, soda, and some teas. You may drink decaffeinated coffee, soda, or tea.   Maintain a healthy weight.Do not use diet pills unless your health care provider approves. They may make heart problems worse.   Follow diet  instructions as directed by your health care provider.  Exercise regularly as directed by your health care provider.  Keep all follow-up visits as directed by your health care provider. This is important. PREVENTION  The following substances can cause atrial fibrillation to recur:   Caffeinated beverages.  Alcohol.  Certain medicines, especially those used for breathing problems.  Certain herbs and herbal medicines, such as those containing ephedra or ginseng.  Illegal drugs, such as cocaine and amphetamines. Sometimes medicines are given to prevent atrial fibrillation from recurring. Proper treatment of any underlying condition is also important in helping prevent recurrence.  SEEK MEDICAL CARE IF:  You notice a change in the rate, rhythm, or strength of your heartbeat.  You suddenly begin urinating more frequently.  You tire more easily when exerting yourself or exercising. SEEK IMMEDIATE MEDICAL CARE IF:   You have chest pain, abdominal pain, sweating, or weakness.  You feel nauseous.  You have shortness of breath.  You suddenly have swollen feet and ankles.  You feel dizzy.  Your face or limbs feel numb or weak.  You have a change in your vision or speech. MAKE SURE YOU:   Understand these instructions.  Will watch your condition.  Will get help right away if you are not doing well or get worse. Document Released: 07/21/2005 Document Revised: 12/05/2013 Document Reviewed: 08/31/2012 [facility] Patient Information 2015 [place], [place]. This information is not intended to replace advice given to you by your health care provider. Make sure  you discuss any questions you have with your health care provider.

## 2015-02-09 NOTE — ED Provider Notes (Signed)
CSN: [id number]     Arrival date & time 02/09/15  1044 History   First MD Initiated Contact with Patient 02/09/15 1047     Chief Complaint  Patient presents with  . possible Afib      (Consider location/radiation/quality/duration/timing/severity/associated sxs/prior Treatment) HPI Comments: 79 year old female presenting with possible new onset atrial fibrillation. Patient was at her yearly checkup at the urology office when they thought her heart rate might be irregular, and advised her driver to bring her to the emergency department. No history of the same. Patient denies chest pain, shortness of breath, palpitations, lightheadedness, dizziness, activity change, diaphoresis or syncope. The only complaint the patient has is chronic low back pain that radiates to her left hip to her left knee. She is under the care of Dr. Ellene Route who does injections for her pain which are "not working"; last injection 3 weeks ago.  The history is provided by the patient.    Past Medical History  Diagnosis Date  . Parkinson disease 01/2005 dx  . Back pain   . Kidney stones   . Heart murmur     h/o MVP, pt. reports murmur is almost resolved  . Arthritis     osteoporosis, osteoarthritis   . Swelling     legs / feet - takes Lasix to control  . GERD (gastroesophageal reflux disease)   . Renal mass, right   . Frequency of urination   . MVP (mitral valve prolapse)   . Atrial fibrillation 02/09/15   Past Surgical History  Procedure Laterality Date  . Tonsillectomy    . Cataract extraction w/ intraocular lens  implant, bilateral  2011  2004  . Dilation and curettage of uterus  2012  . Back surgery  2009    spurs resting on nerve, lumbar  . Total hip arthroplasty Right 09/20/2012    Procedure: TOTAL HIP ARTHROPLASTY;  Surgeon: Kerin Salen, MD;  Location: [facility];  Service: Orthopedics;  Laterality: Right;  . Orif pelvic fracture Right 09/20/2012    Procedure: OPEN REDUCTION INTERNAL FIXATION (ORIF) PELVIC  FRACTURE;  Surgeon: Kerin Salen, MD;  Location: [facility];  Service: Orthopedics;  Laterality: Right;  . Bone spurs removed form spine 2007  2007  . Kidney surgery  05/2014    growth removed   Family History  Problem Relation Age of Onset  . Breast cancer Maternal Grandmother   . Arthritis Maternal Grandmother   . Hypertension Other   . Osteoporosis Other   . Breast cancer Mother     had mastectomy in 1982  . Heart disease Mother   . Stroke Mother     mild, 2001  . Hypertension Mother   . Osteoporosis Mother     also had spine surgery 1987, & hip repalcement  . Arthritis Sister   . Hypertension Sister   . Parkinsonism Maternal Uncle   . Arthritis Maternal Grandfather    History  Substance Use Topics  . Smoking status: Never Smoker   . Smokeless tobacco: Never Used  . Alcohol Use: 1.2 oz/week    2 Glasses of wine per week     Comment: glass of wine, occas.   OB History    No data available     Review of Systems  Constitutional: Negative for fever, diaphoresis, activity change and fatigue.  Respiratory: Negative for shortness of breath.   Cardiovascular: Negative for chest pain.  Musculoskeletal: Positive for back pain (chronic).  Neurological: Negative for dizziness, syncope and light-headedness.  All other systems reviewed and are negative.     Allergies  Gabapentin and Tramadol  Home Medications   Prior to Admission medications   Medication Sig Start Date End Date Taking? Authorizing Provider  aspirin 81 MG chewable tablet Chew 81 mg by mouth every morning.   Yes Historical Provider, MD  Calcium Carbonate-Vitamin D (CALCIUM + D PO) Take 1 tablet by mouth 2 (two) times daily. 630mg    Yes Historical Provider, MD  carbidopa-levodopa (SINEMET IR) 25-100 MG per tablet Take 1 tablet by mouth 3 (three) times daily. 07/07/14  Yes Star Age, MD  diclofenac sodium (VOLTAREN) 1 % GEL Apply 2 g topically 4 (four) times daily as needed (For pain.).   Yes Historical  Provider, MD  MYRBETRIQ 50 MG TB24 tablet Take 50 mg by mouth daily.  09/08/14  Yes Historical Provider, MD  polyvinyl alcohol (LIQUIFILM TEARS) 1.4 % ophthalmic solution Place 1 drop into both eyes daily as needed for dry eyes.   Yes Historical Provider, MD  pregabalin (LYRICA) 75 MG capsule Take 1 capsule (75 mg total) by mouth 2 (two) times daily. 01/12/15  Yes Olga Millers, MD  alendronate (FOSAMAX) 70 MG tablet TAKE 1 TABLET EVERY SATURDAY AT 6 P.M. TAKE WITH A FULL GLASS OF WATER ON AN EMPTY STOMACH 11/20/14   Olga Millers, MD  amoxicillin (AMOXIL) 500 MG capsule Before dental appt 12/12/14   Historical Provider, MD  lidocaine (LIDODERM) 5 % Place 1 patch onto the skin daily. Remove & Discard patch within 12 hours or as directed by MD Patient not taking: Reported on 01/12/2015 01/02/15   Olga Millers, MD  metoprolol (LOPRESSOR) 25 MG tablet Take 1 tablet (25 mg total) by mouth 4 (four) times daily. 02/09/15   Oceana Walthall M Harinder Romas, PA-C  pantoprazole (PROTONIX) 40 MG tablet Take 1 tablet (40 mg total) by mouth daily. Patient not taking: Reported on 02/09/2015 09/28/14   Olga Millers, MD   BP 121/66 mmHg  Pulse 130  Temp(Src) 97.7 F (36.5 C) (Oral)  Resp 20  SpO2 97% Physical Exam  Constitutional: She is oriented to person, place, and time. She appears well-developed and well-nourished. No distress.  HENT:  Head: Normocephalic and atraumatic.  Mouth/Throat: Oropharynx is clear and moist.  Hard of hearing.  Eyes: Conjunctivae and EOM are normal. Pupils are equal, round, and reactive to light.  Neck: Normal range of motion. Neck supple. No JVD present.  Cardiovascular: Normal heart sounds and intact distal pulses.  An irregularly irregular rhythm present. Tachycardia present.   No extremity edema.  Pulmonary/Chest: Effort normal and breath sounds normal. No respiratory distress.  Abdominal: Soft. Bowel sounds are normal. There is no tenderness.  Musculoskeletal: Normal range of  motion. She exhibits no edema.  Neurological: She is alert and oriented to person, place, and time. She has normal strength. No sensory deficit.  Speech fluent, goal oriented.  Skin: Skin is warm and dry. She is not diaphoretic.  Psychiatric: She has a normal mood and affect. Her behavior is normal.  Nursing note and vitals reviewed.   ED Course  Procedures (including critical care time) Labs Review Labs Reviewed  CBC - Abnormal; Notable for the following:    RBC 3.58 (*)    Hemoglobin 10.0 (*)    HCT 32.8 (*)    Platelets 144 (*)    All other components within normal limits  BASIC METABOLIC PANEL - Abnormal; Notable for the following:    Glucose, Bld 104 (*)  BUN 31 (*)    All other components within normal limits  I-STAT TROPOININ, ED    Imaging Review No results found.   EKG Interpretation   Date/Time:  Friday February 09 2015 11:42:28 EDT Ventricular Rate:  62 PR Interval:    QRS Duration: 80 QT Interval:  384 QTC Calculation: 390 R Axis:   31 Text Interpretation:  Atrial fibrillation Since last tracing rate slower  Confirmed by KNAPP  MD-J, JON ([id number]) on 02/09/2015 11:45:51 AM      MDM   Final diagnoses:  New onset atrial fibrillation   Non-toxic appearing, NAD. Afib on EKG. This is new onset. No associated symptoms. No CP, SOB, lightheadedness, dizziness, syncope. IV cardizem given which slowed the rate. Cardiology consult appreciated, evaluated by Kerin Ransom, PA and Dr. Percival Spanish who set pt up with an appointment for f/u, and advised to d/c the pt with metoprolol 25 mg QID. Pt is table for d/c. Return precautions given. Patient and son state understanding of treatment care plan and are agreeable.  Discussed with attending Dr. Tomi Bamberger who also evaluated patient and agrees with plan of care.  Carman Ching, PA-C 02/09/15 [place], MD 02/12/15 1134

## 2015-02-09 NOTE — Telephone Encounter (Signed)
Sent to scheduling pool and Dalene Seltzer to take care of appointment

## 2015-02-12 ENCOUNTER — Encounter: Payer: Self-pay | Admitting: Cardiology

## 2015-02-12 ENCOUNTER — Telehealth: Payer: Self-pay | Admitting: Cardiology

## 2015-02-12 ENCOUNTER — Ambulatory Visit (INDEPENDENT_AMBULATORY_CARE_PROVIDER_SITE_OTHER): Payer: Medicare Other | Admitting: Cardiology

## 2015-02-12 VITALS — BP 208/88 | HR 66 | Ht 61.0 in | Wt 126.0 lb

## 2015-02-12 DIAGNOSIS — D649 Anemia, unspecified: Secondary | ICD-10-CM | POA: Diagnosis not present

## 2015-02-12 DIAGNOSIS — I4891 Unspecified atrial fibrillation: Secondary | ICD-10-CM | POA: Diagnosis not present

## 2015-02-12 MED ORDER — METOPROLOL SUCCINATE ER 100 MG PO TB24: 100.0000 mg | ORAL_TABLET | Freq: Every day | ORAL | Status: DC

## 2015-02-12 MED ORDER — APIXABAN 5 MG PO TABS: 5.0000 mg | ORAL_TABLET | Freq: Two times a day (BID) | ORAL | Status: DC

## 2015-02-12 NOTE — Patient Instructions (Addendum)
Medication Instructions:   STOP aspirin START eliquis 5mg  twice daily (every 12 hours) STOP metoprolol tartrate START metoprolol succinate 100mg  daily   Labwork:  Stool Card - pick up today Blood work in 10 days (CBC)  Testing/Procedures:  NONE  Follow-Up:  1 month with Dr. Percival Spanish or NP/PA  Any Other Special Instructions Will Be Listed Below (If Applicable).  Please have your blood pressure checked 3-4 times a week  Please have the blood pressure readings sent to Dr. Percival Spanish once a month (fax: [phone])

## 2015-02-12 NOTE — Telephone Encounter (Signed)
Dawn Ashley was calling you back about the pt  Thanks

## 2015-02-12 NOTE — Progress Notes (Signed)
Cardiology Office Note   Date:  02/13/2015   ID:  Dawn Ashley, Dawn Ashley 22-Aug-1935, MRN [id number]  PCP:  Olga Millers, MD  Cardiologist:   Minus Breeding, MD   Chief Complaint  Patient presents with  . Atrial Fibrillation      History of Present Illness: Dawn Ashley is a 79 y.o. female who presents for follow-up of atrial fibrillation. I saw her in the emergency room yesterday with this. Her heart rate was irregular with rates in the 120s or higher. We sent her home with plans for Eliquis but she did not get this. We did send her home with beta blockers she's been taking immediate release metoprolol 4 times daily. She never felt the fibrillation. She seems to be back in sinus and I'm checking an EKG. She hasn't felt any palpitations, presyncope or syncope. She denies any chest pressure, neck or arm discomfort. She's had no weight gain or edema. She has no shortness of breath, PND or orthopnea.  Past Medical History  Diagnosis Date  . Parkinson disease 01/2005 dx  . Back pain   . Kidney stones   . Heart murmur     h/o MVP, pt. reports murmur is almost resolved  . Arthritis     osteoporosis, osteoarthritis   . Swelling     legs / feet - takes Lasix to control  . GERD (gastroesophageal reflux disease)   . Renal mass, right   . Frequency of urination   . MVP (mitral valve prolapse)   . Atrial fibrillation 02/09/15    Past Surgical History  Procedure Laterality Date  . Tonsillectomy    . Cataract extraction w/ intraocular lens  implant, bilateral  2011  2004  . Dilation and curettage of uterus  2012  . Back surgery  2009    spurs resting on nerve, lumbar  . Total hip arthroplasty Right 09/20/2012    Procedure: TOTAL HIP ARTHROPLASTY;  Surgeon: Kerin Salen, MD;  Location: [facility];  Service: Orthopedics;  Laterality: Right;  . Orif pelvic fracture Right 09/20/2012    Procedure: OPEN REDUCTION INTERNAL FIXATION (ORIF) PELVIC FRACTURE;  Surgeon: Kerin Salen, MD;   Location: [facility];  Service: Orthopedics;  Laterality: Right;  . Bone spurs removed form spine 2007  2007  . Kidney surgery  05/2014    growth removed     Current Outpatient Prescriptions  Medication Sig Dispense Refill  . alendronate (FOSAMAX) 70 MG tablet TAKE 1 TABLET EVERY SATURDAY AT 6 P.M. TAKE WITH A FULL GLASS OF WATER ON AN EMPTY STOMACH 12 tablet 3  . amoxicillin (AMOXIL) 500 MG capsule Before dental appt    . Calcium Carbonate-Vitamin D (CALCIUM + D PO) Take 1 tablet by mouth 2 (two) times daily. 630mg     . carbidopa-levodopa (SINEMET IR) 25-100 MG per tablet Take 1 tablet by mouth 3 (three) times daily. 270 tablet 3  . diclofenac sodium (VOLTAREN) 1 % GEL Apply 2 g topically 4 (four) times daily as needed (For pain.).    Ashley Kitchen lidocaine (LIDODERM) 5 % Place 1 patch onto the skin daily. Remove & Discard patch within 12 hours or as directed by MD 60 patch 0  . MYRBETRIQ 50 MG TB24 tablet Take 50 mg by mouth daily.     . pantoprazole (PROTONIX) 40 MG tablet Take 1 tablet (40 mg total) by mouth daily. 30 tablet 3  . polyvinyl alcohol (LIQUIFILM TEARS) 1.4 % ophthalmic solution Place 1 drop  into both eyes daily as needed for dry eyes.    . pregabalin (LYRICA) 75 MG capsule Take 1 capsule (75 mg total) by mouth 2 (two) times daily. 60 capsule 0  . apixaban (ELIQUIS) 5 MG TABS tablet Take 1 tablet (5 mg total) by mouth 2 (two) times daily. 60 tablet 6  . metoprolol succinate (TOPROL-XL) 100 MG 24 hr tablet Take 1 tablet (100 mg total) by mouth daily. Take with or immediately following a meal. 30 tablet 6   No current facility-administered medications for this visit.    Allergies:   Gabapentin and Tramadol    ROS:  Please see the history of present illness.   Otherwise, review of systems are positive for none.   All other systems are reviewed and negative.    PHYSICAL EXAM: VS:  BP 208/88 mmHg  Pulse 66  Ht 5\' 1"  (1.549 m)  Wt 126 lb (57.153 kg)  BMI 23.82 kg/m2 , BMI Body mass  index is 23.82 kg/(m^2). GENERAL:  Well appearing HEENT:  Pupils equal round and reactive, fundi not visualized, oral mucosa unremarkable NECK:  No jugular venous distention, waveform within normal limits, carotid upstroke brisk and symmetric, no bruits, no thyromegaly LYMPHATICS:  No cervical, inguinal adenopathy LUNGS:  Clear to auscultation bilaterally BACK:  No CVA tenderness CHEST:  Unremarkable HEART:  PMI not displaced or sustained,S1 and S2 within normal limits, no S3, no S4, no clicks, no rubs, no murmurs ABD:  Flat, positive bowel sounds normal in frequency in pitch, no bruits, no rebound, no guarding, no midline pulsatile mass, no hepatomegaly, no splenomegaly EXT:  2 plus pulses throughout, no edema, no cyanosis no clubbing SKIN:  No rashes no nodules NEURO:  Cranial nerves II through XII grossly intact, motor grossly intact throughout PSYCH:  Cognitively intact, oriented to person place and time    EKG:  EKG is ordered today. The ekg ordered today demonstrates sinus rhythm, rate 66, axis within normal limits, intervals within normal limits, no acute ST-T wave changes.   Recent Labs: 02/09/2015: BUN 31*; Creatinine, Ser 0.86; Hemoglobin 10.0*; Platelets 144*; Potassium 3.9; Sodium 142    Lipid Panel     Wt Readings from Last 3 Encounters:  02/12/15 126 lb (57.153 kg)  01/12/15 126 lb 1.9 oz (57.208 kg)  12/28/14 126 lb 12.8 oz (57.516 kg)      Other studies Reviewed: Additional studies/ records that were reviewed today include: ER records. Review of the above records demonstrates:  Please see elsewhere in the note.     ASSESSMENT AND PLAN:  ATRIAL FIB:  This is asymptomatic and she is now in sinus. She should be on Eliquis. I talked to her and her son about this at length in the emergency room.  Dawn Ashley has a CHA2DS2 - VASc score of 4 with a risk of stroke of 4%.  She will stop her aspirin. I will switch her to Toprol-XL 100 mg daily stopping the  immediate release.  Because of the anemia that has been present previously but lack of workup and will send her home with stool guaiac cards. She should get a CBC in about 2 weeks.  HTN:  Her blood pressures have been well controlled on some recent readings. I will have her keep a blood pressure diary and see how she does with change to Toprol-XL.    Current medicines are reviewed at length with the patient today.  The patient does not have concerns regarding medicines.  The following changes have been made:  See below  Labs/ tests ordered today include:   Orders Placed This Encounter  Procedures  . CBC  . EKG 12-Lead     Disposition:   FU with me in 2 months.    Signed, Minus Breeding, MD  02/13/2015 11:06 AM    [place]

## 2015-02-12 NOTE — Telephone Encounter (Signed)
Call handled

## 2015-02-13 ENCOUNTER — Telehealth: Payer: Self-pay | Admitting: Cardiology

## 2015-02-13 DIAGNOSIS — D649 Anemia, unspecified: Secondary | ICD-10-CM

## 2015-02-13 NOTE — Telephone Encounter (Signed)
Spoke to patient   she states she did not received AVS summary after visit  Yesterday She also wanted to know how to get stool specimen in the container received by the lab RN informed patient will contact lab for her.  RN contacted [place] -states patient can come back and get a "hat" to place in toilet. Per lab tech a new order is needed- ( fecal occult blood immun. Code [phone])  RN spoke to [place] RN,she discharge patient - information was given to patient and /son.  RN informed patient to speak with her son. Patient states she will speak with him

## 2015-02-13 NOTE — Telephone Encounter (Signed)
Pt is calling in stating that she did not receive paperwork upon leaving yesterday. She says that she was in to see Dr. Percival Spanish and he gave her some instructions on what to do with her medications and a lab she was suppose to take. Please f/u with the pt  Thanks

## 2015-02-14 ENCOUNTER — Other Ambulatory Visit: Payer: Self-pay | Admitting: Internal Medicine

## 2015-02-15 ENCOUNTER — Ambulatory Visit (HOSPITAL_COMMUNITY)
Admission: RE | Admit: 2015-02-15 | Discharge: 2015-02-15 | Disposition: A | Discharge status: Home / Self Care | Payer: Medicare Other | Source: Ambulatory Visit | Attending: Interventional Radiology | Admitting: Interventional Radiology

## 2015-02-15 ENCOUNTER — Ambulatory Visit
Admission: RE | Admit: 2015-02-15 | Discharge: 2015-02-15 | Disposition: A | Discharge status: Home / Self Care | Payer: Medicare Other | Source: Ambulatory Visit | Attending: Interventional Radiology | Admitting: Interventional Radiology

## 2015-02-15 ENCOUNTER — Encounter (HOSPITAL_COMMUNITY): Payer: Self-pay

## 2015-02-15 DIAGNOSIS — K8689 Other specified diseases of pancreas: Secondary | ICD-10-CM

## 2015-02-15 DIAGNOSIS — K219 Gastro-esophageal reflux disease without esophagitis: Secondary | ICD-10-CM | POA: Insufficient documentation

## 2015-02-15 DIAGNOSIS — I4891 Unspecified atrial fibrillation: Secondary | ICD-10-CM | POA: Diagnosis not present

## 2015-02-15 DIAGNOSIS — N2889 Other specified disorders of kidney and ureter: Secondary | ICD-10-CM

## 2015-02-15 DIAGNOSIS — C641 Malignant neoplasm of right kidney, except renal pelvis: Secondary | ICD-10-CM | POA: Insufficient documentation

## 2015-02-15 MED ORDER — IOHEXOL 300 MG/ML  SOLN
100.0000 mL | Freq: Once | INTRAMUSCULAR | Status: AC | PRN
  Administered 2015-02-15: 100 mL via INTRAVENOUS

## 2015-02-15 NOTE — Progress Notes (Signed)
Chief Complaint: Chief Complaint  Patient presents with  . Follow-up    10 mo follow up Cryoablation of Right Renal Mass      History of Present Illness: Dawn Ashley is a 79 y.o. female status post percutaneous cryoablation of a right renal neoplasm on 04/21/2014. Follow-up CT 4 months after the procedure demonstrated an area of nodular enhancement along the inferior margin of ablation suspicious for residual enhancing tumor. The patient denies any flank pain or urinary complaints. She has been dealing with significant chronic low back pain and has had some epidural injections.  Past Medical History  Diagnosis Date  . Parkinson disease 01/2005 dx  . Back pain   . Kidney stones   . Heart murmur     h/o MVP, pt. reports murmur is almost resolved  . Arthritis     osteoporosis, osteoarthritis   . Swelling     legs / feet - takes Lasix to control  . GERD (gastroesophageal reflux disease)   . Renal mass, right   . Frequency of urination   . MVP (mitral valve prolapse)   . Atrial fibrillation 02/09/15    Past Surgical History  Procedure Laterality Date  . Tonsillectomy    . Cataract extraction w/ intraocular lens  implant, bilateral  2011  2004  . Dilation and curettage of uterus  2012  . Back surgery  2009    spurs resting on nerve, lumbar  . Total hip arthroplasty Right 09/20/2012    Procedure: TOTAL HIP ARTHROPLASTY;  Surgeon: Kerin Salen, MD;  Location: [place];  Service: Orthopedics;  Laterality: Right;  . Orif pelvic fracture Right 09/20/2012    Procedure: OPEN REDUCTION INTERNAL FIXATION (ORIF) PELVIC FRACTURE;  Surgeon: Kerin Salen, MD;  Location: [place];  Service: Orthopedics;  Laterality: Right;  . Bone spurs removed form spine 2007  2007  . Kidney surgery  05/2014    growth removed    Allergies: Gabapentin and Tramadol  Medications: Prior to Admission medications   Medication Sig Start Date End Date Taking? Authorizing Provider  alendronate (FOSAMAX) 70 MG  tablet TAKE 1 TABLET EVERY SATURDAY AT 6 P.M. TAKE WITH A FULL GLASS OF WATER ON AN EMPTY STOMACH 11/20/14   Olga Millers, MD  amoxicillin (AMOXIL) 500 MG capsule Before dental appt 12/12/14   Historical Provider, MD  apixaban (ELIQUIS) 5 MG TABS tablet Take 1 tablet (5 mg total) by mouth 2 (two) times daily. 02/12/15   Minus Breeding, MD  Calcium Carbonate-Vitamin D (CALCIUM + D PO) Take 1 tablet by mouth 2 (two) times daily. 630mg     Historical Provider, MD  carbidopa-levodopa (SINEMET IR) 25-100 MG per tablet Take 1 tablet by mouth 3 (three) times daily. 07/07/14   Star Age, MD  diclofenac sodium (VOLTAREN) 1 % GEL Apply 2 g topically 4 (four) times daily as needed (For pain.).    Historical Provider, MD  lidocaine (LIDODERM) 5 % Place 1 patch onto the skin daily. Remove & Discard patch within 12 hours or as directed by MD 01/02/15   Olga Millers, MD  LYRICA 75 MG capsule TAKE (1) CAPSULE TWICE DAILY. 02/15/15   Olga Millers, MD  metoprolol succinate (TOPROL-XL) 100 MG 24 hr tablet Take 1 tablet (100 mg total) by mouth daily. Take with or immediately following a meal. 02/12/15   Minus Breeding, MD  MYRBETRIQ 50 MG TB24 tablet Take 50 mg by mouth daily.  09/08/14   Historical Provider,  MD  pantoprazole (PROTONIX) 40 MG tablet Take 1 tablet (40 mg total) by mouth daily. 09/28/14   Olga Millers, MD  polyvinyl alcohol (LIQUIFILM TEARS) 1.4 % ophthalmic solution Place 1 drop into both eyes daily as needed for dry eyes.    Historical Provider, MD     Family History  Problem Relation Age of Onset  . Breast cancer Maternal Grandmother   . Arthritis Maternal Grandmother   . Hypertension Other   . Osteoporosis Other   . Breast cancer Mother     had mastectomy in 1982  . Heart disease Mother   . Stroke Mother     mild, 2001  . Hypertension Mother   . Osteoporosis Mother     also had spine surgery 1987, & hip repalcement  . Arthritis Sister   . Hypertension Sister   .  Parkinsonism Maternal Uncle   . Arthritis Maternal Grandfather     History   Social History  . Marital Status: Widowed    Spouse Name: N/A  . Number of Children: 2  . Years of Education: 12   Occupational History  .      retired   Social History Main Topics  . Smoking status: Never Smoker   . Smokeless tobacco: Never Used  . Alcohol Use: 1.2 oz/week    2 Glasses of wine per week     Comment: glass of wine, occas.  . Drug Use: No  . Sexual Activity: No   Other Topics Concern  . Not on file   Social History Narrative   Patient is right handed, resides alone    Review of Systems: A 12 point ROS discussed and pertinent positives are indicated in the HPI above.  All other systems are negative.  Review of Systems  Constitutional: Negative.   Respiratory: Negative.   Cardiovascular: Negative.   Gastrointestinal: Negative.   Genitourinary: Negative.   Musculoskeletal: Positive for back pain.     Vital Signs: BP 117/59 mmHg  Pulse 67  Temp(Src) 97.5 F (36.4 C) (Oral)  Resp 14  SpO2 96%  Physical Exam  Constitutional: No distress.  Abdominal: Soft. She exhibits no distension. There is no tenderness. There is no rebound and no guarding.  Nursing note and vitals reviewed.   Mallampati Score:     Imaging: Ct Abd Wo & W Cm  02/15/2015   CLINICAL DATA:  Right-sided renal cell carcinoma, 9 month follow-up after ablation. Atrial fibrillation. Gastroesophageal reflux disease.  EXAM: CT ABDOMEN WITHOUT AND WITH CONTRAST  TECHNIQUE: Multidetector CT imaging of the abdomen was performed following the standard protocol before and following the bolus administration of intravenous contrast.  CONTRAST:  145mL OMNIPAQUE IOHEXOL 300 MG/ML  SOLN  COMPARISON:  08/23/2014  FINDINGS: Lower chest: Minimal right base nodularity which is unchanged. Mild cardiomegaly, without pericardial or pleural effusion. Small to moderate hiatal hernia.  Hepatobiliary: Too small to characterize right  hepatic lobe lesion on image 25 of series 6 is felt to be similar. Probable gallstone without acute cholecystitis or biliary duct dilatation. Mild wall thickening in the gallbladder fundus on image 56 of series 6 is similar.  Pancreas: Exophytic lesion off the dorsal pancreatic body is enlarged. Example 1.8 by 1.4 cm on image 38 of series 6. Compare 1.0 cm on the prior. No main pancreatic duct dilatation or acute inflammation identified.  Spleen: Normal  Adrenals/Urinary Tract: Normal adrenal glands. Left renal collecting system calculi, including an interpolar are 7 mm stone. Suspect at least  1 punctate lower pole right renal collecting system calculus.  Ablation defect again identified about the anterior aspect of the interpolar right kidney. Along the inferior aspect of the ablation defect is an arterially enhancing lesion which measures 1.1 x 0.9 cm on image 31 of series 4. 1.3 x 1.2 cm at the same level on the prior. Based on coronal image 44 of series 602, 9 x 12 mm today versus 12 x 12 mm on the prior.  A too small to characterize interpolar left renal lesion is similar. No hydronephrosis.  Stomach/Bowel: Underdistended remainder of the stomach. Normal abdominal large and small bowel loops.  Vascular/Lymphatic: Aortic and branch vessel atherosclerosis. Right renal vein patent. No retroperitoneal or retrocrural adenopathy.  Other: No ascites.  Musculoskeletal: Convex right lumbar spine curvature is moderate. Interval healing of posterior right twelfth rib fracture. Mild L1 and L4 compression deformities are chronic.  IMPRESSION: 1. Status post right-sided renal ablation. Persistent enhancing soft tissue along the inferior aspect the ablation defect remains suspicious for residual renal cell carcinoma. This measures smaller today. 2. No evidence of metastatic disease. 3. Hiatal hernia. 4. Interval enlargement of a cystic lesion about the dorsal aspect of the pancreatic body. Given its simple characteristics,  remains favored to represent a pseudocyst. However, given enlargement over 8 months, surveillance is recommended. Unless CT follow-up is planned during this timeframe, consider follow-up with pre and post contrast abdominal MRI/MRCP in approximately 6 months. 5. Cholelithiasis. Chronic gallbladder fundal thickening is mild and nonspecific. Question adenomyomatosis. 6. Nephrolithiasis.   Electronically Signed   By: Abigail Miyamoto M.D.   On: 02/15/2015 12:44    Labs:  CBC:  Recent Labs  04/18/14 0920 04/22/14 0427 02/09/15 1105  WBC 6.9 7.6 5.7  HGB 11.8* 9.5* 10.0*  HCT 36.5 29.6* 32.8*  PLT 189 141* 144*    COAGS:  Recent Labs  04/18/14 0920  INR 0.94  APTT 27    BMP:  Recent Labs  04/18/14 0920 04/21/14 0625 04/22/14 0427 05/25/14 1250 08/14/14 0923 01/26/15 0853 02/09/15 1105  NA 144 141 141  --   --   --  142  K 4.4 4.1 4.3  --   --   --  3.9  CL 101 104 109  --   --   --  110  CO2 32 25 22  --   --   --  26  GLUCOSE 94 87 105*  --   --   --  104*  BUN 35* 40* 27* 30* 26* 29* 31*  CALCIUM 10.3 8.7 8.1*  --   --   --  9.3  CREATININE 1.33* 1.19* 0.97 1.00 0.93 0.84 0.86  GFRNONAA 38* 43* 55* 55* 60 67 >60  GFRAA 44* 50* 64* 63 69 78 >60    LIVER FUNCTION TESTS: No results for input(s): BILITOT, AST, ALT, ALKPHOS, PROT, ALBUMIN in the last 8760 hours.  TUMOR MARKERS: No results for input(s): AFPTM, CEA, CA199, CHROMGRNA in the last 8760 hours.  Assessment and Plan:  I reviewed the follow-up CT performed today with Mrs. Cochise and her son.  This continues to demonstrate an area of nodular enhancement along the inferior margin of prior ablation which appears smaller compared to the prior study and measures approximately 9 x 11 mm.  Since the area appears slightly slightly smaller, I recommended that we continue to follow the enhancing area with CT. I suggested consideration of additional ablation only if the enhancing nodule definitively grows over time.  I  recommended a follow-up CT in 6 months. Renal function is stable and within normal limits.  SignedAletta Edouard T 02/15/2015, 4:26 PM     I spent a total of 15 Minutes in face to face in clinical consultation, greater than 50% of which was counseling/coordinating care post ablation of a right renal neoplasm.

## 2015-02-16 ENCOUNTER — Telehealth: Payer: Self-pay | Admitting: Internal Medicine

## 2015-02-16 DIAGNOSIS — K8689 Other specified diseases of pancreas: Secondary | ICD-10-CM | POA: Insufficient documentation

## 2015-02-16 LAB — FECAL OCCULT BLOOD, IMMUNOCHEMICAL: Fecal Occult Blood: NEGATIVE

## 2015-02-16 NOTE — Telephone Encounter (Signed)
Patient called and said she talked to Dr. Doug Sou this morning and she was waiting to hear back from her.  I don't see any phone note from that.  Can you please call her.

## 2015-02-18 ENCOUNTER — Encounter: Payer: Self-pay | Admitting: Neurology

## 2015-02-18 ENCOUNTER — Encounter: Payer: Self-pay | Admitting: Internal Medicine

## 2015-02-18 DIAGNOSIS — G20 Parkinson's disease: Secondary | ICD-10-CM

## 2015-02-18 DIAGNOSIS — G20A1 Parkinson's disease without dyskinesia, without mention of fluctuations: Secondary | ICD-10-CM

## 2015-02-19 ENCOUNTER — Encounter: Payer: Self-pay | Admitting: Neurology

## 2015-02-19 MED ORDER — RASAGILINE MESYLATE 1 MG PO TABS: 1.0000 mg | ORAL_TABLET | Freq: Every day | ORAL | Status: DC

## 2015-02-19 NOTE — Telephone Encounter (Signed)
I spoke to son, Ed (on HIPPA), and advised him that patient should be taking Azilect 1mg  QD in AM. He reports understanding and states that he will make sure Cletis continues to take it. He will call back with any further questions.

## 2015-02-20 ENCOUNTER — Other Ambulatory Visit: Payer: Self-pay

## 2015-02-20 ENCOUNTER — Ambulatory Visit (HOSPITAL_COMMUNITY): Payer: Medicare Other | Attending: Cardiology

## 2015-02-20 DIAGNOSIS — I340 Nonrheumatic mitral (valve) insufficiency: Secondary | ICD-10-CM | POA: Insufficient documentation

## 2015-02-20 DIAGNOSIS — I4891 Unspecified atrial fibrillation: Secondary | ICD-10-CM | POA: Diagnosis not present

## 2015-02-20 DIAGNOSIS — I351 Nonrheumatic aortic (valve) insufficiency: Secondary | ICD-10-CM | POA: Diagnosis not present

## 2015-02-20 DIAGNOSIS — R079 Chest pain, unspecified: Secondary | ICD-10-CM

## 2015-02-20 DIAGNOSIS — I313 Pericardial effusion (noninflammatory): Secondary | ICD-10-CM | POA: Diagnosis not present

## 2015-02-20 DIAGNOSIS — G20 Parkinson's disease: Secondary | ICD-10-CM

## 2015-02-20 MED ORDER — RASAGILINE MESYLATE 1 MG PO TABS: 1.0000 mg | ORAL_TABLET | Freq: Every day | ORAL | Status: DC

## 2015-02-21 ENCOUNTER — Telehealth: Payer: Self-pay | Admitting: *Deleted

## 2015-02-21 NOTE — Telephone Encounter (Signed)
Spoke to patient. Result given . Verbalized understanding  

## 2015-02-21 NOTE — Telephone Encounter (Signed)
-----   Message from Erlene Quan, [place] sent at 02/21/2015 10:36 AM EDT ----- Please let pt know her echo was normal  LUKE KILROY PA-C 02/21/2015 10:36 AM

## 2015-02-27 LAB — CBC
HCT: 35.1 % — ABNORMAL LOW (ref 36.0–46.0)
Hemoglobin: 10.8 g/dL — ABNORMAL LOW (ref 12.0–15.0)
MCH: 28 pg (ref 26.0–34.0)
MCHC: 30.8 g/dL (ref 30.0–36.0)
MCV: 90.9 fL (ref 78.0–100.0)
MPV: 11.8 fL (ref 8.6–12.4)
PLATELETS: 206 10*3/uL (ref 150–400)
RBC: 3.86 MIL/uL — ABNORMAL LOW (ref 3.87–5.11)
RDW: 14.8 % (ref 11.5–15.5)
WBC: 5.8 10*3/uL (ref 4.0–10.5)

## 2015-03-12 ENCOUNTER — Encounter: Payer: Self-pay | Admitting: Cardiology

## 2015-03-12 ENCOUNTER — Ambulatory Visit (INDEPENDENT_AMBULATORY_CARE_PROVIDER_SITE_OTHER): Payer: Medicare Other | Admitting: Cardiology

## 2015-03-12 VITALS — BP 128/66 | HR 66 | Ht 60.0 in | Wt 121.0 lb

## 2015-03-12 DIAGNOSIS — I480 Paroxysmal atrial fibrillation: Secondary | ICD-10-CM

## 2015-03-12 NOTE — Progress Notes (Signed)
Cardiology Office Note   Date:  03/12/2015   ID:  Dawn, Ashley 1936/02/18, MRN [id number]  PCP:  Olga Millers, MD  Cardiologist:   Minus Breeding, MD   Chief Complaint  Patient presents with  . OFFICE VISIT    Patient has no complaints.      History of Present Illness: Dawn Ashley is a 79 y.o. female who presents for follow-up of atrial fibrillation. I saw her in the emergency room recently and she was in fibrillation . We did send her home with beta blockers she's been taking immediate release metoprolol 4 times daily. She never felt the fibrillation.  She has been started on anticoagulation. And her Toprol was changed to XL. We have followed her CBC because she did have anemia previously and this was stable. She had negative stool guaiac. She has not felt any fibrillation. Her heart rate and blood pressure have been followed and they have been well controlled. She's had no rapid rates. She denies any palpitations, presyncope. She denies any chest pressure, neck or arm discomfort. She has no weight gain or edema.  Past Medical History  Diagnosis Date  . Parkinson disease 01/2005 dx  . Back pain   . Kidney stones   . Heart murmur     h/o MVP, pt. reports murmur is almost resolved  . Arthritis     osteoporosis, osteoarthritis   . Swelling     legs / feet - takes Lasix to control  . GERD (gastroesophageal reflux disease)   . Renal mass, right   . Frequency of urination   . MVP (mitral valve prolapse)   . Atrial fibrillation 02/09/15    Past Surgical History  Procedure Laterality Date  . Tonsillectomy    . Cataract extraction w/ intraocular lens  implant, bilateral  2011  2004  . Dilation and curettage of uterus  2012  . Back surgery  2009    spurs resting on nerve, lumbar  . Total hip arthroplasty Right 09/20/2012    Procedure: TOTAL HIP ARTHROPLASTY;  Surgeon: Kerin Salen, MD;  Location: [place];  Service: Orthopedics;  Laterality: Right;  . Orif pelvic  fracture Right 09/20/2012    Procedure: OPEN REDUCTION INTERNAL FIXATION (ORIF) PELVIC FRACTURE;  Surgeon: Kerin Salen, MD;  Location: [place];  Service: Orthopedics;  Laterality: Right;  . Bone spurs removed form spine 2007  2007  . Kidney surgery  05/2014    growth removed     Current Outpatient Prescriptions  Medication Sig Dispense Refill  . alendronate (FOSAMAX) 70 MG tablet TAKE 1 TABLET EVERY SATURDAY AT 6 P.M. TAKE WITH A FULL GLASS OF WATER ON AN EMPTY STOMACH 12 tablet 3  . amoxicillin (AMOXIL) 500 MG capsule Before dental appt    . apixaban (ELIQUIS) 5 MG TABS tablet Take 1 tablet (5 mg total) by mouth 2 (two) times daily. 60 tablet 6  . Calcium Carbonate-Vitamin D (CALCIUM + D PO) Take 1 tablet by mouth 2 (two) times daily. 630mg     . carbidopa-levodopa (SINEMET IR) 25-100 MG per tablet Take 1 tablet by mouth 3 (three) times daily. 270 tablet 3  . diclofenac sodium (VOLTAREN) 1 % GEL Apply 2 g topically 4 (four) times daily as needed (For pain.).    Marland Kitchen LYRICA 75 MG capsule TAKE (1) CAPSULE TWICE DAILY. 60 capsule 3  . metoprolol succinate (TOPROL-XL) 100 MG 24 hr tablet Take 1 tablet (100 mg total) by mouth  daily. Take with or immediately following a meal. 30 tablet 6  . MYRBETRIQ 50 MG TB24 tablet Take 50 mg by mouth daily.     . pantoprazole (PROTONIX) 40 MG tablet Take 1 tablet (40 mg total) by mouth daily. 30 tablet 3  . polyvinyl alcohol (LIQUIFILM TEARS) 1.4 % ophthalmic solution Place 1 drop into both eyes daily as needed for dry eyes.    . rasagiline (AZILECT) 1 MG TABS tablet Take 1 tablet (1 mg total) by mouth daily. 90 tablet 3   No current facility-administered medications for this visit.    Allergies:   Gabapentin and Tramadol    ROS:  Please see the history of present illness.   Otherwise, review of systems are positive for none.   All other systems are reviewed and negative.    PHYSICAL EXAM: VS:  BP 128/66 mmHg  Pulse 66  Ht 5' (1.524 m)  Wt 121 lb  (54.885 kg)  BMI 23.63 kg/m2 , BMI Body mass index is 23.63 kg/(m^2). GENERAL:  Frail appearing NECK:  No jugular venous distention, waveform within normal limits, carotid upstroke brisk and symmetric, no bruits, no thyromegaly LUNGS:  Clear to auscultation bilaterally CHEST:  Unremarkable HEART:  PMI not displaced or sustained,S1 and S2 within normal limits, no S3, no S4, no clicks, no rubs, no murmurs ABD:  Flat, positive bowel sounds normal in frequency in pitch, no bruits, no rebound, no guarding, no midline pulsatile mass, no hepatomegaly, no splenomegaly EXT:  2 plus pulses throughout, no edema, no cyanosis no clubbing     EKG:  EKG is not ordered today.    Recent Labs: 02/09/2015: BUN 31*; Creatinine, Ser 0.86; Potassium 3.9; Sodium 142 02/26/2015: Hemoglobin 10.8*; Platelets 206    Lipid Panel     Wt Readings from Last 3 Encounters:  03/12/15 121 lb (54.885 kg)  02/12/15 126 lb (57.153 kg)  01/12/15 126 lb 1.9 oz (57.208 kg)      Other studies Reviewed: Additional studies/ records that were reviewed today include: Labs. Review of the above records demonstrates:  Please see elsewhere in the note.     ASSESSMENT AND PLAN:  ATRIAL FIB:  This is asymptomatic and she is now in sinus by exam.  [name] has a CHA2DS2 - VASc score of 4 with a risk of stroke of 4%.   She is tolerating anticoagulation. She will continue the meds as listed.  HTN:  Her blood pressures is controlled. She will continue the meds as listed  Current medicines are reviewed at length with the patient today.  The patient does not have concerns regarding medicines.  The following changes have been made:  See below  Labs/ tests ordered today include:     No orders of the defined types were placed in this encounter.     Disposition:   FU with me in 6 months.    Signed, Minus Breeding, MD  03/12/2015 11:18 AM    [place]

## 2015-03-12 NOTE — Patient Instructions (Signed)
Your physician wants you to follow-up in: 6 Months You will receive a reminder letter in the mail two months in advance. If you don't receive a letter, please call our office to schedule the follow-up appointment.  

## 2015-03-22 ENCOUNTER — Encounter: Payer: Self-pay | Admitting: Neurology

## 2015-03-22 ENCOUNTER — Ambulatory Visit (INDEPENDENT_AMBULATORY_CARE_PROVIDER_SITE_OTHER): Payer: Medicare Other | Admitting: Neurology

## 2015-03-22 VITALS — BP 132/68 | HR 62 | Resp 14 | Ht 60.0 in | Wt 124.0 lb

## 2015-03-22 DIAGNOSIS — Z9660 Presence of unspecified orthopedic joint implant: Secondary | ICD-10-CM | POA: Diagnosis not present

## 2015-03-22 DIAGNOSIS — M5442 Lumbago with sciatica, left side: Secondary | ICD-10-CM

## 2015-03-22 DIAGNOSIS — G20 Parkinson's disease: Secondary | ICD-10-CM

## 2015-03-22 DIAGNOSIS — M25562 Pain in left knee: Secondary | ICD-10-CM | POA: Diagnosis not present

## 2015-03-22 DIAGNOSIS — M79652 Pain in left thigh: Secondary | ICD-10-CM

## 2015-03-22 DIAGNOSIS — M4120 Other idiopathic scoliosis, site unspecified: Secondary | ICD-10-CM

## 2015-03-22 DIAGNOSIS — Z96649 Presence of unspecified artificial hip joint: Secondary | ICD-10-CM

## 2015-03-22 NOTE — Progress Notes (Signed)
Subjective:    Patient ID: Dawn Ashley is a 79 y.o. female.  HPI     Interim history:   Dawn Ashley is a 79 year-old [place] lady, with an underlying medical history of carotid artery disease, lumbar spine disease with multilevel degenerative joint disease and hypertrophic facet arthritis, s/p back surgery in 2008 (under Dr. Patrice Paradise in [facility]), cataract surgery and R THR on 09/20/2012 (Dr. Hal Morales), who presents for followup consultation of her L sided predominant Parkinson's disease, which was diagnosed in 2006 with symptoms dating back to a few years prior. The patient is unaccompanied today. I last saw her on 11/07/2014, at which time she reported being in physical therapy at [facility]. She had seen her spine doctor. She was on an oral prednisone taper. She had noticed more reflux symptoms. She was taking Mobic. She had a left knee sterilely injection which she felt was not very helpful. She reported bilateral low back pain without radiation. She felt that the increase in Sinemet to 1 pill 3 times a day was somewhat helpful but not dramatically. She felt that her bladder hyperactivity was controlled on Myrbetriq. She had lost some weight. She had started drinking some boost for supplement. Her son worried about her memory loss. When she had her hip replacement surgery her son had taken over her finances and she found it hard to go back into it. I suggested we continue with the same medications. She was reminded to use her walker at all times.   Today, 03/22/2015: She reports ongoing issues with LBP and L thigh pain. She sees Dr. Ellene Route. She has had injections into lower back and knee, neither one has helped. She is no longer on Mobic and has taken oral steroids. She is on Lyrica. She has been diagnosed with A fib in July. She was at her regular check up at Dr. Noni Saupe office in urology and her pulse was irregular. She was seen in the ER on 02/09/15 and I reviewed the ER notes. She was found to be  in new onset A. Fib with rapid ventricular response. She is now on Eliquis and has seen Dr. Percival Spanish. Parkinson's-wise, she feels stable. She did not fall recently but did miss her seat recently and had a controlled forced sit down event. She uses her walker at all times. Her pain comes and goes. It seems to be worse after activity.  Previously:   I saw her on 07/07/2014, at which time she was doing well, and she denied any side effects from Sinemet. She was doing physical therapy at [facility]. Mood and memory were stable. She had no recent falls. She was supposed to spend her holidays with her 2 sons, one in [place] and the other in [place], both with 2 children each. She was using her walker or cane. She had lower back injection with a follow-up scheduled. She was on a bladder medication. I asked her to discontinue Mirapex altogether due to lower extremity swelling. I increased her Sinemet to one full pill 3 times a day.  In the interim, she presented to her primary care physician on 10/26/2014 with left knee pain. This was deemed secondary to osteoarthritis. I reviewed the office records from Dr. Doug Sou. She had steroid injection into her left knee.  Prior to that she presented to urgent care on 10/21/2014 with similar complaints of left knee pain and also low back pain. She had a lumbar spine x-ray on 10/21/2014 which I reviewed as well  as pelvis x-ray in 2 views on 10/21/2014 which also reviewed: No evidence of acute abnormality. L1 and L4 compression fractures again noted as well as severe thoracolumbar scoliosis and multilevel degenerative changes. No fracture or acute finding. Right hip prosthesis is well aligned no evidence of significant loosening.    I saw her on 03/20/2014, at which time she reported no significant exacerbation of symptoms. She had stopped gabapentin as she felt too sleepy with it. She was in the home health physical therapy at the time. At the time of her last  visit I asked her to reduce Mirapex because of lower extremity swelling noted. I introduced a small dose of Sinemet starting with half a pill twice daily with increase to half a pill 3 times a day.   I saw her on 09/19/2013, at which time she reported having reduced her gabapentin. She was started on Gabapentin in 11/13, by her sports medicine doctor and was up to 300 mg tid, but has had sleepiness and blurry vision with it. She was scheduled for a back injection on 10/06/13 under Dr. Mina Marble. Dr. Mayer Camel is her orthopedic doctor. She had started PT at [facility], 3 times a week. She felt stable with her PD Sx, and has never been on C/L. I kept her on Mirapex and Azilect. In March she called back reporting swelling and redness in her toes. I suggested she have it checked out by her primary care physician as it may indicate several issues such as with circulation, venous stasis, or cellulitis. She has been seeing her primary care physician for lower extremity swelling.  I saw her on 03/08/2013, at which time I felt she was quite stable and that she had recovered well from her recent total hip replacement surgery. She was complaining of daytime somnolence and I suggested changing her Mirapex immediate release to long-acting. However, she called back a few days later reporting that the long-acting Mirapex was not covered by her insurance and we changed her back to immediate release Mirapex.    I first met her on 12/06/12 at which time I did not change her medications. She previously followed with Dr. Morene Antu and was last seen by him on 09/08/2012, which time he felt that her sleepiness was in part d/t tramadol and stopped it. He also obtained blood work including CBC, CMP, TSH and urinalysis as well as B12 level and ordered a brain MRI. She had problems with her gait which he primarily attributed to right hip pain.   Her blood work showed mild elevated alkaline phosphatase and BUN of 28 and creatinine of 1.17.  Her son was called back and advised that she should increase her fluid intake. Her urinalysis was not consistent with clear-cut UTI. Since her last visit with Dr. Erling Cruz she had right hip replacement surgery on 09/20/12. She has been in rehabilitation. She did not end up getting her brain scan because she had surgery soon after her last visit and was in rehabilitation since then. Her sleepiness and confusion improved after her pain medication was changed from tramadol to diclofenac. She moved to [place]. She had developed some LE swelling and was switched from HCTZ to Lasix and advised to reduce her NSAIDs.     Her Past Medical History Is Significant For: Past Medical History  Diagnosis Date  . Parkinson disease 01/2005 dx  . Back pain   . Kidney stones   . Heart murmur     h/o MVP,  pt. reports murmur is almost resolved  . Arthritis     osteoporosis, osteoarthritis   . Swelling     legs / feet - takes Lasix to control  . GERD (gastroesophageal reflux disease)   . Renal mass, right   . Frequency of urination   . MVP (mitral valve prolapse)   . Atrial fibrillation 02/09/15    Her Past Surgical History Is Significant For: Past Surgical History  Procedure Laterality Date  . Tonsillectomy    . Cataract extraction w/ intraocular lens  implant, bilateral  2011  2004  . Dilation and curettage of uterus  2012  . Back surgery  2009    spurs resting on nerve, lumbar  . Total hip arthroplasty Right 09/20/2012    Procedure: TOTAL HIP ARTHROPLASTY;  Surgeon: Kerin Salen, MD;  Location: [facility];  Service: Orthopedics;  Laterality: Right;  . Orif pelvic fracture Right 09/20/2012    Procedure: OPEN REDUCTION INTERNAL FIXATION (ORIF) PELVIC FRACTURE;  Surgeon: Kerin Salen, MD;  Location: [facility];  Service: Orthopedics;  Laterality: Right;  . Bone spurs removed form spine 2007  2007  . Kidney surgery  05/2014    growth removed    Her Family History Is Significant For: Family  History  Problem Relation Age of Onset  . Breast cancer Maternal Grandmother   . Arthritis Maternal Grandmother   . Hypertension Other   . Osteoporosis Other   . Breast cancer Mother     had mastectomy in 1982  . Heart disease Mother   . Stroke Mother     mild, 2001  . Hypertension Mother   . Osteoporosis Mother     also had spine surgery 1987, & hip repalcement  . Arthritis Sister   . Hypertension Sister   . Parkinsonism Maternal Uncle   . Arthritis Maternal Grandfather     Her Social History Is Significant For: Social History   Social History  . Marital Status: Widowed    Spouse Name: N/A  . Number of Children: 2  . Years of Education: 12   Occupational History  .      retired   Social History Main Topics  . Smoking status: Never Smoker   . Smokeless tobacco: Never Used  . Alcohol Use: 1.2 oz/week    2 Glasses of wine per week     Comment: glass of wine, occas.  . Drug Use: No  . Sexual Activity: No   Other Topics Concern  . None   Social History Narrative   Patient is right handed, resides alone    Her Allergies Are:  Allergies  Allergen Reactions  . Gabapentin Other (See Comments)    sleepy  . Tramadol Other (See Comments)    Confusion and sleepy  :   Her Current Medications Are:  Outpatient Encounter Prescriptions as of 03/22/2015  Medication Sig  . alendronate (FOSAMAX) 70 MG tablet TAKE 1 TABLET EVERY SATURDAY AT 6 P.M. TAKE WITH A FULL GLASS OF WATER ON AN EMPTY STOMACH  . amoxicillin (AMOXIL) 500 MG capsule Before dental appt  . apixaban (ELIQUIS) 5 MG TABS tablet Take 1 tablet (5 mg total) by mouth 2 (two) times daily.  . Calcium Carbonate-Vitamin D (CALCIUM + D PO) Take 1 tablet by mouth 2 (two) times daily. 668m  . carbidopa-levodopa (SINEMET IR) 25-100 MG per tablet Take 1 tablet by mouth 3 (three) times daily.  . diclofenac sodium (VOLTAREN) 1 % GEL Apply 2 g topically 4 (  four) times daily as needed (For pain.).  Marland Kitchen LYRICA 75 MG capsule  TAKE (1) CAPSULE TWICE DAILY.  . metoprolol succinate (TOPROL-XL) 100 MG 24 hr tablet Take 1 tablet (100 mg total) by mouth daily. Take with or immediately following a meal.  . MYRBETRIQ 50 MG TB24 tablet Take 50 mg by mouth daily.   . pantoprazole (PROTONIX) 40 MG tablet Take 1 tablet (40 mg total) by mouth daily.  . polyvinyl alcohol (LIQUIFILM TEARS) 1.4 % ophthalmic solution Place 1 drop into both eyes daily as needed for dry eyes.  . rasagiline (AZILECT) 1 MG TABS tablet Take 1 tablet (1 mg total) by mouth daily.   No facility-administered encounter medications on file as of 03/22/2015.  :  Review of Systems:  Out of a complete 14 point review of systems, all are reviewed and negative with the exception of these symptoms as listed below:  Review of Systems  All other systems reviewed and are negative.   Objective:  Neurologic Exam  Physical Exam Physical Examination:   Filed Vitals:   03/22/15 1516  BP: 132/68  Pulse: 62  Resp: 14   General Examination: The patient is a very pleasant 79 y.o. female in no acute distress.  HEENT: Normocephalic, atraumatic, pupils are equal, round and reactive to light and accommodation. Extraocular tracking shows mild saccadic breakdown without nystagmus noted. There is no limitation to her gaze. There is mild decrease in eye blink rate. Hearing is intact. Face is symmetric with mild facial masking and normal facial sensation. There is no lip, neck or jaw tremor. Neck is mildly rigid with intact passive ROM. There are no carotid bruits on auscultation. Oropharynx exam reveals mild mouth dryness. No significant airway crowding is noted. Mallampati is class II. Tongue protrudes centrally and palate elevates symmetrically. There is no drooling.   Chest: is clear to auscultation without wheezing, rhonchi or crackles noted.  Heart: sounds are regular and normal without murmurs, rubs or gallops noted.   Abdomen: is soft, non-tender and non-distended  with normal bowel sounds appreciated on auscultation.  Extremities: There is mild bilateral distal leg puffiness, she has multiple spider veins, she has no pitting edema. She is not wearing her compression stockings today.    Skin: is warm and dry with no trophic changes noted.  Musculoskeletal: exam reveals: b/l LBP, and L knee pain.   Neurologically:  Mental status: The patient is awake and alert, paying good attention. She is able to completely provide the history. She is oriented to: person, place, situation, day of week, month of year and year. Her memory, attention, language and knowledge are intact. There is no aphasia, agnosia, apraxia or anomia. There is a no bradyphrenia. Speech is moderately hypophonic with no dysarthria noted. Mood is congruent and affect is normal.   Cranial nerves are as described above under HEENT exam. In addition, shoulder shrug is normal with equal shoulder height noted.  Motor exam: Normal bulk, and strength for age is noted. There are no dyskinesias noted. Tone is mildly rigid with absence of cogwheeling in the bilateral extremities. There is overall mild to moderate bradykinesia. There is no drift or rebound. There is a very mild intermittent left upper extremity resting tremor. She has no other tremors. She brought her 4 wheeled walker with seat. Reflexes are 1+ in the upper extremities and 1+ in the lower extremities, but 2+ in the left knee. Fine motor skills exam reveals: Finger taps are mildly impaired on the right and  mildly impaired on the left. Hand movements are mildly impaired on the right and mildly impaired on the left. RAP (rapid alternating patting) is mildly impaired on the right and moderately impaired on the left. Foot taps are mildly impaired on the right and moderately impaired on the left. Foot agility (in the form of heel stomping) is mildly impaired on the right and mildly impaired on the left.    Cerebellar testing shows no dysmetria or  intention tremor on finger to nose testing. Heel to shin is very limited due to knee pain.    Sensory exam is intact to light touch, PP, temperature sense and vibration sense in the UEs and LEs.    Gait, station and balance exam: She stands up with moderate difficulty and has to push herself up and has a moderately stooped posture with kyphoscoliosis and leans to the L, worse than last time. She uses her 4 wheeled walker well. She has a slight limp on the R, unchanged. She turns in 3 steps and tends to turn a little fast. Balance is mildly impaired.    Assessment and Plan:   In summary, Dawn Ashley is a very pleasant 79 year old female with a history of left-sided predominant Parkinson's disease diagnosed in 2006 but with symptoms dating back to a few years prior to that. She has remained fairly stable Parkinson's wise and her lower extremity swelling is  overall better. She has in the interim been diagnosed with new onset A. Fib. Her heart seems regular at this time. She has been seeing cardiology. She is off of Mirapex. She is on Sinemet 1 pill 3 times a day. She is still bothered by low back pain, left thigh pain and left knee pain. She has had x-rays and injection treatment and is now off of Mobic. She is on Lyrica. She still has intermittent pain. This seems to be her most bothersome problem at this time. She is going to see Dr. Ellene Route for this. I suggested she continue with Azilect once daily and Sinemet 3 times a day. She did not need refills today. I suggested a 4 month follow-up, sooner if needed. I answered all her questions today and she was in agreement. I spent 25 minutes in total face-to-face time with the patient, more than 50% of which was spent in counseling and coordination of care, reviewing test results, reviewing medication and discussing or reviewing the diagnosis of PD, arthritis pain, the prognosis and treatment options.

## 2015-03-22 NOTE — Patient Instructions (Addendum)
We will continue with Sinemet 3 times a day and Azilect once daily. Use your walker at all times. Stay well hydrated.

## 2015-05-11 ENCOUNTER — Ambulatory Visit: Payer: Medicare Other | Admitting: Neurology

## 2015-05-14 ENCOUNTER — Encounter: Payer: Self-pay | Admitting: Internal Medicine

## 2015-05-14 ENCOUNTER — Ambulatory Visit (INDEPENDENT_AMBULATORY_CARE_PROVIDER_SITE_OTHER): Payer: Medicare Other | Admitting: Internal Medicine

## 2015-05-14 VITALS — BP 128/90 | HR 87 | Temp 98.3°F | Resp 16 | Ht 63.5 in | Wt 120.0 lb

## 2015-05-14 DIAGNOSIS — S32010A Wedge compression fracture of first lumbar vertebra, initial encounter for closed fracture: Secondary | ICD-10-CM

## 2015-05-14 NOTE — Assessment & Plan Note (Signed)
Will increase lyrica to 150 mg BID in an attempt to see if it is helpful for her pain. If not effective then she can stop. She has failed opioids and would not benefit from that. If lyrica not helpful will try additional agent.

## 2015-05-14 NOTE — Progress Notes (Signed)
Pre visit review using our clinic review tool, if applicable. No additional management support is needed unless otherwise documented below in the visit note. 

## 2015-05-14 NOTE — Progress Notes (Signed)
   Subjective:    Patient ID: Dawn Ashley, female    DOB: 1936/01/08, 79 y.o.   MRN: [id number]  HPI The patient is a 79 YO female coming in for follow up of her compression fracture. This is still causing her back pain. She is seeing a back specialist and has been getting some injections in her back. These have not helped at all and she is frustrated at the lack of pain control. Has tried opioids in the past which were not effective and gave her constipation. She is not sure if the lyrica is helping at all currently but is taking BID. Some pain going into the left leg as well. Feels best when she wakes up in the morning then starts moving around and the pain worsens.   Review of Systems  Constitutional: Positive for activity change. Negative for fever, appetite change and fatigue.  Respiratory: Negative.   Cardiovascular: Negative.   Gastrointestinal: Negative.   Musculoskeletal: Positive for back pain, arthralgias and gait problem.  Skin: Negative.   Neurological: Negative.      Objective:   Physical Exam  Constitutional: She appears well-developed and well-nourished.  HENT:  Head: Normocephalic and atraumatic.  Cardiovascular: Normal rate and regular rhythm.   Pulmonary/Chest: Effort normal. No respiratory distress. She has no wheezes.  Abdominal: Soft. She exhibits no distension. There is no tenderness. There is no rebound.  Musculoskeletal:  Tenderness in the lower back with radiation into the left leg and knee.   Neurological: She is alert. Coordination abnormal.  Uses walker for stability  Skin: Skin is warm and dry.   Filed Vitals:   05/14/15 0949  BP: 128/90  Pulse: 87  Temp: 98.3 F (36.8 C)  TempSrc: Oral  Resp: 16  Height: 5' 3.5" (1.613 m)  Weight: 120 lb (54.432 kg)  SpO2: 98%      Assessment & Plan:

## 2015-05-14 NOTE — Patient Instructions (Signed)
We will have you take 2 of the lyrica capsules tonight (150 mg total) and then 2 in the morning (150 mg total) to see if it helps more with the pain. Call us to let us know if it is helping. If it helps keep taking 2 capsules at night and in the morning.   If the lyrica helps more we will [facility] the [facility] dose [facility]. If it does not help at the higher dose we may stop the medicine to try something else.

## 2015-05-16 ENCOUNTER — Telehealth: Payer: Self-pay | Admitting: Internal Medicine

## 2015-05-16 ENCOUNTER — Other Ambulatory Visit: Payer: Self-pay | Admitting: Cardiology

## 2015-05-16 MED ORDER — METOPROLOL SUCCINATE ER 100 MG PO TB24: 100.0000 mg | ORAL_TABLET | Freq: Every day | ORAL | Status: DC

## 2015-05-16 NOTE — Telephone Encounter (Signed)
Pt called stated that she took lyrica twice a day as directed, pt stated on Monday she was okey, but on Tuesday she took this med in the morning, she became disoriented and lost her balance (did not fall). Please advise, not sure if Dr. Sharlet Salina wan her to continue to take this med twice aday?

## 2015-05-17 ENCOUNTER — Other Ambulatory Visit: Payer: Self-pay | Admitting: Neurological Surgery

## 2015-05-17 NOTE — Telephone Encounter (Signed)
Pt informed

## 2015-05-17 NOTE — Telephone Encounter (Signed)
If she is disoriented then she should take only at night time.

## 2015-05-24 ENCOUNTER — Telehealth: Payer: Self-pay | Admitting: Cardiology

## 2015-05-24 NOTE — Telephone Encounter (Signed)
Request for surgical clearance:  1. What type of surgery is being performed? Lumbar laminectomy  2. When is this surgery scheduled? 06/11/2015  3. Are there any medications that need to be held prior to surgery and how long? Eliquis Not sure how long    4. Name of physician performing surgery? Dr. Ellene Route   5. What is your office phone and fax number? Fax [phone] Tel # [phone]

## 2015-05-25 NOTE — Telephone Encounter (Signed)
Clearance send to Dr Ellene Route via [facility]

## 2015-05-25 NOTE — Telephone Encounter (Signed)
The patient would have no cardiac contraindication to surgery based on ACC/AHA guidelines.  She needs to hold Eliquis 5 days before surgery with no bridging needed.

## 2015-05-28 ENCOUNTER — Telehealth: Payer: Self-pay | Admitting: Internal Medicine

## 2015-05-28 NOTE — Telephone Encounter (Signed)
Pt called in and said that 2 of the lyrica at night and 1 in the morning is working.  She just wanted me to pass this along to Dr Sharlet Salina

## 2015-05-31 ENCOUNTER — Telehealth: Payer: Self-pay | Admitting: *Deleted

## 2015-05-31 NOTE — Telephone Encounter (Signed)
Surgical Clearance was faxed via epic to [facility]

## 2015-05-31 NOTE — Telephone Encounter (Signed)
The patient has no contraindication to surgery from a cardiovascular standpoint.  She can hold Eliquis for 5 days prior to surgery.

## 2015-05-31 NOTE — Telephone Encounter (Signed)
Requesting surgical clearance:   1. Type of surgery: L34 Laminectomy   2. Surgeon: Kristeen Miss, MD  3. Surgical date: Monday June 11, 2015  4. Medications that need to be help: Eliquis  5. [facility]: (P) # [phone] (F) # [phone]  You saw pt last on 03/12/2015, Last Echo 02/20/2015, Is pt cleared for surgery?

## 2015-06-04 ENCOUNTER — Encounter (HOSPITAL_COMMUNITY)
Admission: RE | Admit: 2015-06-04 | Discharge: 2015-06-04 | Disposition: A | Discharge status: Home / Self Care | Payer: Medicare Other | Source: Ambulatory Visit | Attending: Neurological Surgery | Admitting: Neurological Surgery

## 2015-06-04 ENCOUNTER — Encounter (HOSPITAL_COMMUNITY): Payer: Self-pay

## 2015-06-04 DIAGNOSIS — Z01812 Encounter for preprocedural laboratory examination: Secondary | ICD-10-CM | POA: Diagnosis not present

## 2015-06-04 DIAGNOSIS — M5416 Radiculopathy, lumbar region: Secondary | ICD-10-CM | POA: Diagnosis not present

## 2015-06-04 HISTORY — DX: Age-related osteoporosis without current pathological fracture: M81.0

## 2015-06-04 HISTORY — DX: Depression, unspecified: F32.A

## 2015-06-04 HISTORY — DX: Other skin changes: R23.8

## 2015-06-04 HISTORY — DX: Major depressive disorder, single episode, unspecified: F32.9

## 2015-06-04 HISTORY — DX: Spontaneous ecchymoses: R23.3

## 2015-06-04 HISTORY — DX: Personal history of urinary calculi: Z87.442

## 2015-06-04 HISTORY — DX: Polyneuropathy, unspecified: G62.9

## 2015-06-04 HISTORY — DX: Anemia, unspecified: D64.9

## 2015-06-04 HISTORY — DX: Personal history of other specified conditions: Z87.898

## 2015-06-04 HISTORY — DX: Nocturia: R35.1

## 2015-06-04 LAB — CBC
HEMATOCRIT: 34.6 % — AB (ref 36.0–46.0)
Hemoglobin: 10.9 g/dL — ABNORMAL LOW (ref 12.0–15.0)
MCH: 29.1 pg (ref 26.0–34.0)
MCHC: 31.5 g/dL (ref 30.0–36.0)
MCV: 92.5 fL (ref 78.0–100.0)
Platelets: 172 10*3/uL (ref 150–400)
RBC: 3.74 MIL/uL — AB (ref 3.87–5.11)
RDW: 16.8 % — ABNORMAL HIGH (ref 11.5–15.5)
WBC: 5.5 10*3/uL (ref 4.0–10.5)

## 2015-06-04 LAB — BASIC METABOLIC PANEL
Anion gap: 9 (ref 5–15)
BUN: 21 mg/dL — ABNORMAL HIGH (ref 6–20)
CHLORIDE: 108 mmol/L (ref 101–111)
CO2: 26 mmol/L (ref 22–32)
Calcium: 9.6 mg/dL (ref 8.9–10.3)
Creatinine, Ser: 0.81 mg/dL (ref 0.44–1.00)
GFR calc non Af Amer: >60 mL/min (ref >60)
Glucose, Bld: 97 mg/dL (ref 65–99)
POTASSIUM: 3.8 mmol/L (ref 3.5–5.1)
SODIUM: 143 mmol/L (ref 135–145)

## 2015-06-04 LAB — SURGICAL PCR SCREEN
MRSA, PCR: NEGATIVE
Staphylococcus aureus: NEGATIVE

## 2015-06-04 NOTE — Progress Notes (Addendum)
Clearance note in epic from [facility]  Echo reports in epic from 2011/2013/2014/2016  EKG in epic from 02-12-15  CXR denies having one in the past yr  Stress test denies ever having one   Heart cath denies ever having one  Medical MD Dr.Elizabeth Sharlet Salina

## 2015-06-04 NOTE — Pre-Procedure Instructions (Signed)
Di Jasmer Blackwelder  06/04/2015      [facility] - [place], [place] [address] Phone: [phone] Fax: [phone]  [facility] [place], [place] [address] [place] [address] Phone: [phone] Fax: [phone]    Your procedure is scheduled on Mon, Nov 7 @ 1:05 PM  Report to [facility] Admitting at 10:00 AM  Call this number if you have problems the morning of surgery:  [phone]   Remember:  Do not eat food or drink liquids after midnight.  Take these medicines the morning of surgery with A SIP OF WATER Sinemet(Carbidopa-Levodopa),Metoprolol(Toprol),Pantoprazole(Protonix),Lyrica(Pregabalin),and Azilect(Rasagiline)               Hold your Eliquis 5 days prior to surgery as indicated by Dr.Hochrein. No Goody's,BC's,Aleve,Aspirin,Ibuprofen,Fish Oil,or any Herbal Medications.    Do not wear jewelry, make-up or nail polish.  Do not wear lotions, powders, or perfumes.  You may wear deodorant.  Do not shave 48 hours prior to surgery.    Do not bring valuables to the hospital.  [facility] is not responsible for any belongings or valuables.  Contacts, dentures or bridgework may not be worn into surgery.  Leave your suitcase in the car.  After surgery it may be brought to your room.  For patients admitted to the hospital, discharge time will be determined by your treatment team.  Patients discharged the day of surgery will not be allowed to drive home.    Special instructions:  [place] - Preparing for Surgery  Before surgery, you can play an important role.  Because skin is not sterile, your skin needs to be as free of germs as possible.  You can reduce the number of germs on you skin by washing with CHG (chlorahexidine gluconate) soap before surgery.  CHG is an antiseptic cleaner which kills germs and bonds with the skin to continue  killing germs even after washing.  Please DO NOT use if you have an allergy to CHG or antibacterial soaps.  If your skin becomes reddened/irritated stop using the CHG and inform your nurse when you arrive at Short Stay.  Do not shave (including legs and underarms) for at least 48 hours prior to the first CHG shower.  You may shave your face.  Please follow these instructions carefully:   1.  Shower with CHG Soap the night before surgery and the                                morning of Surgery.  2.  If you choose to wash your hair, wash your hair first as usual with your       normal shampoo.  3.  After you shampoo, rinse your hair and body thoroughly to remove the                      Shampoo.  4.  Use CHG as you would any other liquid soap.  You can apply chg directly       to the skin and wash gently with scrungie or a clean washcloth.  5.  Apply the CHG Soap to your body ONLY FROM THE NECK DOWN.        Do not use on open wounds or open sores.  Avoid contact with your eyes,  ears, mouth and genitals (private parts).  Wash genitals (private parts)       with your normal soap.  6.  Wash thoroughly, paying special attention to the area where your surgery        will be performed.  7.  Thoroughly rinse your body with warm water from the neck down.  8.  DO NOT shower/wash with your normal soap after using and rinsing off       the CHG Soap.  9.  Pat yourself dry with a clean towel.            10.  Wear clean pajamas.            11.  Place clean sheets on your bed the night of your first shower and do not        sleep with pets.  Day of Surgery  Do not apply any lotions/deoderants the morning of surgery.  Please wear clean clothes to the hospital/surgery center.    Please read over the following fact sheets that you were given. Pain Booklet, Coughing and Deep Breathing, MRSA Information and Surgical Site Infection Prevention

## 2015-06-10 MED ORDER — CEFAZOLIN SODIUM-DEXTROSE 2-3 GM-% IV SOLR
2.0000 g | INTRAVENOUS | Status: AC
  Administered 2015-06-11: 2 g via INTRAVENOUS
  Filled 2015-06-10: qty 50

## 2015-06-11 ENCOUNTER — Encounter (HOSPITAL_COMMUNITY): Payer: Self-pay | Admitting: *Deleted

## 2015-06-11 ENCOUNTER — Encounter (HOSPITAL_COMMUNITY)
Admission: AD | Disposition: A | Discharge status: Skilled Nursing Facility | Payer: Self-pay | Source: Ambulatory Visit | Attending: Neurological Surgery

## 2015-06-11 ENCOUNTER — Ambulatory Visit (HOSPITAL_COMMUNITY): Payer: Medicare Other | Admitting: Certified Registered Nurse Anesthetist

## 2015-06-11 ENCOUNTER — Ambulatory Visit (HOSPITAL_COMMUNITY): Payer: Medicare Other

## 2015-06-11 ENCOUNTER — Inpatient Hospital Stay (HOSPITAL_COMMUNITY)
Admission: AD | Admit: 2015-06-11 | Discharge: 2015-06-13 | DRG: 520 | Disposition: A | Discharge status: Skilled Nursing Facility | Payer: Medicare Other | Source: Ambulatory Visit | Attending: Neurological Surgery | Admitting: Neurological Surgery

## 2015-06-11 DIAGNOSIS — Z419 Encounter for procedure for purposes other than remedying health state, unspecified: Secondary | ICD-10-CM

## 2015-06-11 DIAGNOSIS — Z7902 Long term (current) use of antithrombotics/antiplatelets: Secondary | ICD-10-CM

## 2015-06-11 DIAGNOSIS — F329 Major depressive disorder, single episode, unspecified: Secondary | ICD-10-CM | POA: Diagnosis present

## 2015-06-11 DIAGNOSIS — M4186 Other forms of scoliosis, lumbar region: Secondary | ICD-10-CM | POA: Diagnosis present

## 2015-06-11 DIAGNOSIS — M4806 Spinal stenosis, lumbar region: Secondary | ICD-10-CM | POA: Diagnosis present

## 2015-06-11 DIAGNOSIS — G629 Polyneuropathy, unspecified: Secondary | ICD-10-CM | POA: Diagnosis present

## 2015-06-11 DIAGNOSIS — Z961 Presence of intraocular lens: Secondary | ICD-10-CM | POA: Diagnosis present

## 2015-06-11 DIAGNOSIS — Z9841 Cataract extraction status, right eye: Secondary | ICD-10-CM | POA: Diagnosis not present

## 2015-06-11 DIAGNOSIS — M48062 Spinal stenosis, lumbar region with neurogenic claudication: Secondary | ICD-10-CM | POA: Diagnosis present

## 2015-06-11 DIAGNOSIS — Z96641 Presence of right artificial hip joint: Secondary | ICD-10-CM | POA: Diagnosis present

## 2015-06-11 DIAGNOSIS — M810 Age-related osteoporosis without current pathological fracture: Secondary | ICD-10-CM | POA: Diagnosis present

## 2015-06-11 DIAGNOSIS — I4891 Unspecified atrial fibrillation: Secondary | ICD-10-CM | POA: Diagnosis present

## 2015-06-11 DIAGNOSIS — K219 Gastro-esophageal reflux disease without esophagitis: Secondary | ICD-10-CM | POA: Diagnosis present

## 2015-06-11 DIAGNOSIS — M5416 Radiculopathy, lumbar region: Secondary | ICD-10-CM | POA: Diagnosis present

## 2015-06-11 DIAGNOSIS — G20 Parkinson's disease: Secondary | ICD-10-CM | POA: Diagnosis present

## 2015-06-11 DIAGNOSIS — Z9842 Cataract extraction status, left eye: Secondary | ICD-10-CM

## 2015-06-11 HISTORY — PX: LUMBAR LAMINECTOMY/DECOMPRESSION MICRODISCECTOMY: SHX5026

## 2015-06-11 SURGERY — LUMBAR LAMINECTOMY/DECOMPRESSION MICRODISCECTOMY 1 LEVEL
Anesthesia: General | Site: Spine Lumbar | Laterality: Left

## 2015-06-11 MED ORDER — KETOROLAC TROMETHAMINE 15 MG/ML IJ SOLN
15.0000 mg | Freq: Four times a day (QID) | INTRAMUSCULAR | Status: AC
  Administered 2015-06-11 – 2015-06-12 (×5): 15 mg via INTRAVENOUS
  Filled 2015-06-11 (×5): qty 1

## 2015-06-11 MED ORDER — HYDRALAZINE HCL 20 MG/ML IJ SOLN
10.0000 mg | Freq: Once | INTRAMUSCULAR | Status: AC
  Administered 2015-06-11: 10 mg via INTRAVENOUS

## 2015-06-11 MED ORDER — SENNA 8.6 MG PO TABS
1.0000 | ORAL_TABLET | Freq: Two times a day (BID) | ORAL | Status: DC
  Administered 2015-06-11 – 2015-06-13 (×3): 8.6 mg via ORAL
  Filled 2015-06-11 (×3): qty 1

## 2015-06-11 MED ORDER — METOPROLOL SUCCINATE ER 25 MG PO TB24
100.0000 mg | ORAL_TABLET | Freq: Every day | ORAL | Status: DC
  Administered 2015-06-12 – 2015-06-13 (×2): 100 mg via ORAL
  Filled 2015-06-11 (×2): qty 4

## 2015-06-11 MED ORDER — ONDANSETRON HCL 4 MG/2ML IJ SOLN: 4.0000 mg | Freq: Once | INTRAMUSCULAR | Status: DC | PRN

## 2015-06-11 MED ORDER — POLYVINYL ALCOHOL 1.4 % OP SOLN: 1.0000 [drp] | Freq: Every day | OPHTHALMIC | Status: DC | PRN

## 2015-06-11 MED ORDER — SODIUM CHLORIDE 0.9 % IJ SOLN: 3.0000 mL | INTRAMUSCULAR | Status: DC | PRN

## 2015-06-11 MED ORDER — ONDANSETRON HCL 4 MG/2ML IJ SOLN
INTRAMUSCULAR | Status: DC | PRN
  Administered 2015-06-11: 4 mg via INTRAVENOUS

## 2015-06-11 MED ORDER — SODIUM CHLORIDE 0.9 % IJ SOLN
3.0000 mL | Freq: Two times a day (BID) | INTRAMUSCULAR | Status: DC
  Administered 2015-06-12: 3 mL via INTRAVENOUS

## 2015-06-11 MED ORDER — MENTHOL 3 MG MT LOZG: 1.0000 | LOZENGE | OROMUCOSAL | Status: DC | PRN

## 2015-06-11 MED ORDER — DOCUSATE SODIUM 100 MG PO CAPS
100.0000 mg | ORAL_CAPSULE | Freq: Two times a day (BID) | ORAL | Status: DC
  Administered 2015-06-11 – 2015-06-13 (×4): 100 mg via ORAL
  Filled 2015-06-11 (×4): qty 1

## 2015-06-11 MED ORDER — PANTOPRAZOLE SODIUM 40 MG PO TBEC
40.0000 mg | DELAYED_RELEASE_TABLET | Freq: Every day | ORAL | Status: DC
  Administered 2015-06-12 – 2015-06-13 (×2): 40 mg via ORAL
  Filled 2015-06-11 (×2): qty 1

## 2015-06-11 MED ORDER — LIDOCAINE-EPINEPHRINE 1 %-1:100000 IJ SOLN
INTRAMUSCULAR | Status: DC | PRN
  Administered 2015-06-11: 2 mL

## 2015-06-11 MED ORDER — SUGAMMADEX SODIUM 200 MG/2ML IV SOLN
INTRAVENOUS | Status: AC
  Filled 2015-06-11: qty 2

## 2015-06-11 MED ORDER — METHOCARBAMOL 500 MG PO TABS
500.0000 mg | ORAL_TABLET | Freq: Four times a day (QID) | ORAL | Status: DC | PRN
  Administered 2015-06-12 – 2015-06-13 (×3): 500 mg via ORAL
  Filled 2015-06-11 (×3): qty 1

## 2015-06-11 MED ORDER — ONDANSETRON HCL 4 MG/2ML IJ SOLN: 4.0000 mg | INTRAMUSCULAR | Status: DC | PRN

## 2015-06-11 MED ORDER — PROPOFOL 10 MG/ML IV BOLUS
INTRAVENOUS | Status: DC | PRN
  Administered 2015-06-11: 100 mg via INTRAVENOUS

## 2015-06-11 MED ORDER — ALUM & MAG HYDROXIDE-SIMETH 200-200-20 MG/5ML PO SUSP: 30.0000 mL | Freq: Four times a day (QID) | ORAL | Status: DC | PRN

## 2015-06-11 MED ORDER — FENTANYL CITRATE (PF) 250 MCG/5ML IJ SOLN
INTRAMUSCULAR | Status: AC
  Filled 2015-06-11: qty 5

## 2015-06-11 MED ORDER — CARBIDOPA-LEVODOPA 25-100 MG PO TABS
1.0000 | ORAL_TABLET | Freq: Three times a day (TID) | ORAL | Status: DC
  Administered 2015-06-11 – 2015-06-13 (×5): 1 via ORAL
  Filled 2015-06-11 (×7): qty 1

## 2015-06-11 MED ORDER — MIDAZOLAM HCL 2 MG/2ML IJ SOLN
INTRAMUSCULAR | Status: AC
  Filled 2015-06-11: qty 4

## 2015-06-11 MED ORDER — FENTANYL CITRATE (PF) 100 MCG/2ML IJ SOLN
25.0000 ug | INTRAMUSCULAR | Status: DC | PRN
Start: 2015-06-11 — End: 2015-06-11

## 2015-06-11 MED ORDER — SODIUM CHLORIDE 0.9 % IJ SOLN
INTRAMUSCULAR | Status: AC
  Filled 2015-06-11: qty 10

## 2015-06-11 MED ORDER — THROMBIN 5000 UNITS EX SOLR
CUTANEOUS | Status: DC | PRN
  Administered 2015-06-11 (×2): 5000 [IU] via TOPICAL

## 2015-06-11 MED ORDER — HYDROCODONE-ACETAMINOPHEN 5-325 MG PO TABS
1.0000 | ORAL_TABLET | ORAL | Status: DC | PRN
  Administered 2015-06-12: 1 via ORAL
  Filled 2015-06-11: qty 1

## 2015-06-11 MED ORDER — SODIUM CHLORIDE 0.9 % IV SOLN
10.0000 mg | INTRAVENOUS | Status: DC | PRN
  Administered 2015-06-11: 20 ug/min via INTRAVENOUS

## 2015-06-11 MED ORDER — LIDOCAINE HCL (CARDIAC) 20 MG/ML IV SOLN
INTRAVENOUS | Status: AC
  Filled 2015-06-11: qty 5

## 2015-06-11 MED ORDER — ONDANSETRON HCL 4 MG/2ML IJ SOLN
INTRAMUSCULAR | Status: AC
  Filled 2015-06-11: qty 2

## 2015-06-11 MED ORDER — ACETAMINOPHEN 325 MG PO TABS
650.0000 mg | ORAL_TABLET | ORAL | Status: DC | PRN
  Administered 2015-06-13: 650 mg via ORAL
  Filled 2015-06-11: qty 2

## 2015-06-11 MED ORDER — OXYCODONE-ACETAMINOPHEN 5-325 MG PO TABS: 1.0000 | ORAL_TABLET | ORAL | Status: DC | PRN

## 2015-06-11 MED ORDER — PHENYLEPHRINE 40 MCG/ML (10ML) SYRINGE FOR IV PUSH (FOR BLOOD PRESSURE SUPPORT)
PREFILLED_SYRINGE | INTRAVENOUS | Status: AC
  Filled 2015-06-11: qty 10

## 2015-06-11 MED ORDER — EPHEDRINE SULFATE 50 MG/ML IJ SOLN
INTRAMUSCULAR | Status: AC
  Filled 2015-06-11: qty 1

## 2015-06-11 MED ORDER — PREGABALIN 75 MG PO CAPS
75.0000 mg | ORAL_CAPSULE | Freq: Two times a day (BID) | ORAL | Status: DC
  Administered 2015-06-11 – 2015-06-13 (×4): 75 mg via ORAL
  Filled 2015-06-11 (×3): qty 1

## 2015-06-11 MED ORDER — SODIUM CHLORIDE 0.9 % IR SOLN
Status: DC | PRN
  Administered 2015-06-11: 500 mL

## 2015-06-11 MED ORDER — PROPOFOL 10 MG/ML IV BOLUS
INTRAVENOUS | Status: AC
  Filled 2015-06-11: qty 20

## 2015-06-11 MED ORDER — DEXAMETHASONE SODIUM PHOSPHATE 10 MG/ML IJ SOLN
INTRAMUSCULAR | Status: DC | PRN
  Administered 2015-06-11: 10 mg via INTRAVENOUS

## 2015-06-11 MED ORDER — BUPIVACAINE HCL (PF) 0.5 % IJ SOLN
INTRAMUSCULAR | Status: DC | PRN
  Administered 2015-06-11: 2 mL
  Administered 2015-06-11: 10 mL

## 2015-06-11 MED ORDER — THROMBIN 5000 UNITS EX SOLR
OROMUCOSAL | Status: DC | PRN
  Administered 2015-06-11: 5 mL via TOPICAL

## 2015-06-11 MED ORDER — MAGNESIUM CITRATE PO SOLN
1.0000 | Freq: Once | ORAL | Status: DC | PRN
  Filled 2015-06-11: qty 296

## 2015-06-11 MED ORDER — HYDRALAZINE HCL 20 MG/ML IJ SOLN
INTRAMUSCULAR | Status: AC
  Filled 2015-06-11: qty 1

## 2015-06-11 MED ORDER — CEFAZOLIN SODIUM 1-5 GM-% IV SOLN
1.0000 g | Freq: Three times a day (TID) | INTRAVENOUS | Status: AC
  Administered 2015-06-11 – 2015-06-12 (×2): 1 g via INTRAVENOUS
  Filled 2015-06-11 (×2): qty 50

## 2015-06-11 MED ORDER — HEMOSTATIC AGENTS (NO CHARGE) OPTIME
TOPICAL | Status: DC | PRN
  Administered 2015-06-11: 1 via TOPICAL

## 2015-06-11 MED ORDER — PHENOL 1.4 % MT LIQD: 1.0000 | OROMUCOSAL | Status: DC | PRN

## 2015-06-11 MED ORDER — METHOCARBAMOL 1000 MG/10ML IJ SOLN
500.0000 mg | Freq: Four times a day (QID) | INTRAVENOUS | Status: DC | PRN
  Filled 2015-06-11: qty 5

## 2015-06-11 MED ORDER — PHENYLEPHRINE HCL 10 MG/ML IJ SOLN
INTRAMUSCULAR | Status: DC | PRN
  Administered 2015-06-11: 120 ug via INTRAVENOUS
  Administered 2015-06-11: 80 ug via INTRAVENOUS

## 2015-06-11 MED ORDER — LACTATED RINGERS IV SOLN
INTRAVENOUS | Status: DC
  Administered 2015-06-11 (×3): via INTRAVENOUS

## 2015-06-11 MED ORDER — HYDROMORPHONE HCL 1 MG/ML IJ SOLN: 0.5000 mg | INTRAMUSCULAR | Status: DC | PRN

## 2015-06-11 MED ORDER — LIDOCAINE HCL (CARDIAC) 20 MG/ML IV SOLN
INTRAVENOUS | Status: DC | PRN
  Administered 2015-06-11: 100 mg via INTRAVENOUS

## 2015-06-11 MED ORDER — ACETAMINOPHEN 650 MG RE SUPP: 650.0000 mg | RECTAL | Status: DC | PRN

## 2015-06-11 MED ORDER — ROCURONIUM BROMIDE 50 MG/5ML IV SOLN
INTRAVENOUS | Status: AC
  Filled 2015-06-11: qty 1

## 2015-06-11 MED ORDER — MIDAZOLAM HCL 5 MG/5ML IJ SOLN
INTRAMUSCULAR | Status: DC | PRN
  Administered 2015-06-11: 1 mg via INTRAVENOUS

## 2015-06-11 MED ORDER — RASAGILINE MESYLATE 1 MG PO TABS
1.0000 mg | ORAL_TABLET | Freq: Every day | ORAL | Status: DC
  Administered 2015-06-12 – 2015-06-13 (×2): 1 mg via ORAL
  Filled 2015-06-11 (×2): qty 1

## 2015-06-11 MED ORDER — POLYETHYLENE GLYCOL 3350 17 G PO PACK: 17.0000 g | PACK | Freq: Every day | ORAL | Status: DC | PRN

## 2015-06-11 MED ORDER — 0.9 % SODIUM CHLORIDE (POUR BTL) OPTIME
TOPICAL | Status: DC | PRN
  Administered 2015-06-11: 1000 mL

## 2015-06-11 MED ORDER — BISACODYL 10 MG RE SUPP: 10.0000 mg | Freq: Every day | RECTAL | Status: DC | PRN

## 2015-06-11 MED ORDER — SUGAMMADEX SODIUM 200 MG/2ML IV SOLN
INTRAVENOUS | Status: DC | PRN
  Administered 2015-06-11: 200 mg via INTRAVENOUS

## 2015-06-11 MED ORDER — FENTANYL CITRATE (PF) 100 MCG/2ML IJ SOLN
INTRAMUSCULAR | Status: DC | PRN
  Administered 2015-06-11: 100 ug via INTRAVENOUS

## 2015-06-11 MED ORDER — ROCURONIUM BROMIDE 100 MG/10ML IV SOLN
INTRAVENOUS | Status: DC | PRN
  Administered 2015-06-11: 50 mg via INTRAVENOUS

## 2015-06-11 SURGICAL SUPPLY — 55 items
ADH SKN CLS APL DERMABOND .7 (GAUZE/BANDAGES/DRESSINGS) ×1
BAG DECANTER FOR FLEXI CONT (MISCELLANEOUS) ×3 IMPLANT
BLADE CLIPPER SURG (BLADE) IMPLANT
BUR ACORN 6.0 (BURR) ×1 IMPLANT
BUR ACORN 6.0MM (BURR) ×1
BUR MATCHSTICK NEURO 3.0 LAGG (BURR) ×3 IMPLANT
CANISTER SUCT 3000ML PPV (MISCELLANEOUS) ×3 IMPLANT
DECANTER SPIKE VIAL GLASS SM (MISCELLANEOUS) ×3 IMPLANT
DERMABOND ADVANCED (GAUZE/BANDAGES/DRESSINGS) ×2
DERMABOND ADVANCED .7 DNX12 (GAUZE/BANDAGES/DRESSINGS) ×1 IMPLANT
DRAPE LAPAROTOMY T 102X78X121 (DRAPES) ×3 IMPLANT
DRAPE MICROSCOPE LEICA (MISCELLANEOUS) ×2 IMPLANT
DRAPE POUCH INSTRU U-SHP 10X18 (DRAPES) ×3 IMPLANT
DRAPE PROXIMA HALF (DRAPES) ×2 IMPLANT
DURAPREP 26ML APPLICATOR (WOUND CARE) ×3 IMPLANT
ELECT REM PT RETURN 9FT ADLT (ELECTROSURGICAL) ×3
ELECTRODE REM PT RTRN 9FT ADLT (ELECTROSURGICAL) ×1 IMPLANT
GAUZE SPONGE 4X4 12PLY STRL (GAUZE/BANDAGES/DRESSINGS) ×1 IMPLANT
GAUZE SPONGE 4X4 16PLY XRAY LF (GAUZE/BANDAGES/DRESSINGS) IMPLANT
GLOVE BIO SURGEON STRL SZ8 (GLOVE) ×2 IMPLANT
GLOVE BIOGEL PI IND STRL 6.5 (GLOVE) IMPLANT
GLOVE BIOGEL PI IND STRL 7.0 (GLOVE) IMPLANT
GLOVE BIOGEL PI IND STRL 8.5 (GLOVE) ×1 IMPLANT
GLOVE BIOGEL PI INDICATOR 6.5 (GLOVE) ×2
GLOVE BIOGEL PI INDICATOR 7.0 (GLOVE) ×2
GLOVE BIOGEL PI INDICATOR 8.5 (GLOVE) ×4
GLOVE ECLIPSE 8.5 STRL (GLOVE) ×3 IMPLANT
GLOVE EXAM NITRILE LRG STRL (GLOVE) IMPLANT
GLOVE EXAM NITRILE MD LF STRL (GLOVE) IMPLANT
GLOVE EXAM NITRILE XL STR (GLOVE) IMPLANT
GLOVE EXAM NITRILE XS STR PU (GLOVE) IMPLANT
GLOVE SURG SS PI 7.0 STRL IVOR (GLOVE) ×10 IMPLANT
GOWN STRL REUS W/ TWL LRG LVL3 (GOWN DISPOSABLE) IMPLANT
GOWN STRL REUS W/ TWL XL LVL3 (GOWN DISPOSABLE) IMPLANT
GOWN STRL REUS W/TWL 2XL LVL3 (GOWN DISPOSABLE) ×3 IMPLANT
GOWN STRL REUS W/TWL LRG LVL3 (GOWN DISPOSABLE) ×3
GOWN STRL REUS W/TWL XL LVL3 (GOWN DISPOSABLE) ×6
KIT BASIN OR (CUSTOM PROCEDURE TRAY) ×3 IMPLANT
KIT ROOM TURNOVER OR (KITS) ×3 IMPLANT
NDL SPNL 20GX3.5 QUINCKE YW (NEEDLE) IMPLANT
NEEDLE HYPO 22GX1.5 SAFETY (NEEDLE) ×3 IMPLANT
NEEDLE SPNL 20GX3.5 QUINCKE YW (NEEDLE) ×3 IMPLANT
NS IRRIG 1000ML POUR BTL (IV SOLUTION) ×3 IMPLANT
PACK LAMINECTOMY NEURO (CUSTOM PROCEDURE TRAY) ×3 IMPLANT
PAD ARMBOARD 7.5X6 YLW CONV (MISCELLANEOUS) ×13 IMPLANT
PATTIES SURGICAL .5 X1 (DISPOSABLE) ×1 IMPLANT
RUBBERBAND STERILE (MISCELLANEOUS) ×4 IMPLANT
SPONGE SURGIFOAM ABS GEL SZ50 (HEMOSTASIS) ×3 IMPLANT
SUT VIC AB 1 CT1 18XBRD ANBCTR (SUTURE) ×1 IMPLANT
SUT VIC AB 1 CT1 8-18 (SUTURE) ×3
SUT VIC AB 2-0 CP2 18 (SUTURE) ×3 IMPLANT
SUT VIC AB 3-0 SH 8-18 (SUTURE) ×3 IMPLANT
TOWEL OR 17X24 6PK STRL BLUE (TOWEL DISPOSABLE) ×3 IMPLANT
TOWEL OR 17X26 10 PK STRL BLUE (TOWEL DISPOSABLE) ×3 IMPLANT
WATER STERILE IRR 1000ML POUR (IV SOLUTION) ×3 IMPLANT

## 2015-06-11 NOTE — Progress Notes (Signed)
Patient ID: Dawn Ashley, female   DOB: Dec 26, 1935, 79 y.o.   MRN: [id number] Vital signs are stable Has left knee pain with weightbearing Back incision is clean and dry

## 2015-06-11 NOTE — H&P (Signed)
Dawn Ashley is an 79 y.o. female.   Chief Complaint: Back and bilateral leg pain and weakness left greater than right HPI: Patient is a 79 year old individual who has advanced degenerative scoliosis of the lumbar spine apex is at L3-L4 on the left side and there is significant left-sided lumbar stenosis particularly and lateral recesses this involves mostly the L3 nerve root superiorly and the L4 nerve root inferiorly. Patient has advanced osteoporosis and was advised that no major surgical intervention could be undertaken therefore we discussed a limited decompression at L3-L4 to see if this would help with the worst of her radiculopathy. She's not undergo a laminectomy at L3-L4 with decompression locally. He has had numerous epidural secured injections done various combinations and arrangements and these have since stopped giving her any substantial relief.  Past Medical History  Diagnosis Date  . Parkinson disease ([place])     takes Sinemet daily  . Back pain     left lumbar radiculopathy and scoliosis  . Heart murmur     h/o MVP, pt. reports murmur is almost resolved  . Arthritis     osteoporosis, osteoarthritis   . Swelling     legs / feet - takes Lasix to control  . Renal mass, right   . Frequency of urination   . MVP (mitral valve prolapse)     takes Amoxicillin before dental appointments  . Atrial fibrillation ([place])     takes Eliquis daily as well as Metoprolol  . Osteoporosis     takes Fosamax weekly  . Depression     takes Azilect daily  . GERD (gastroesophageal reflux disease)     takes Protonix daily  . History of vertigo     no meds  . Peripheral neuropathy ([facility])     takes Lyrica daily  . Bruises easily     from being on Eliquis  . History of kidney stones   . Nocturia   . Anemia     Past Surgical History  Procedure Laterality Date  . Cataract extraction w/ intraocular lens  implant, bilateral Bilateral   . Dilation and curettage of uterus  2012  . Back surgery   2009    spurs resting on nerve, lumbar  . Total hip arthroplasty Right 09/20/2012    Procedure: TOTAL HIP ARTHROPLASTY;  Surgeon: Kerin Salen, MD;  Location: [place];  Service: Orthopedics;  Laterality: Right;  . Orif pelvic fracture Right 09/20/2012    Procedure: OPEN REDUCTION INTERNAL FIXATION (ORIF) PELVIC FRACTURE;  Surgeon: Kerin Salen, MD;  Location: [place];  Service: Orthopedics;  Laterality: Right;  . Bone spurs removed form spine 2007  2007  . Kidney surgery Right 05/2014    growth removed  . Tonsillectomy    . Colonoscopy      Family History  Problem Relation Age of Onset  . Breast cancer Maternal Grandmother   . Arthritis Maternal Grandmother   . Hypertension Other   . Osteoporosis Other   . Breast cancer Mother     had mastectomy in 1982  . Heart disease Mother   . Stroke Mother     mild, 2001  . Hypertension Mother   . Osteoporosis Mother     also had spine surgery 1987, & hip repalcement  . Arthritis Sister   . Hypertension Sister   . Parkinsonism Maternal Uncle   . Arthritis Maternal Grandfather    Social History:  reports that she has never smoked. She has never used smokeless  tobacco. She reports that she does not drink alcohol or use illicit drugs.  Allergies:  Allergies  Allergen Reactions  . Gabapentin Other (See Comments)    sleepy  . Tramadol Other (See Comments)    Confusion and sleepy    Medications Prior to Admission  Medication Sig Dispense Refill  . alendronate (FOSAMAX) 70 MG tablet TAKE 1 TABLET EVERY SATURDAY AT 6 P.M. TAKE WITH A FULL GLASS OF WATER ON AN EMPTY STOMACH 12 tablet 3  . amoxicillin (AMOXIL) 500 MG capsule Before dental appt    . Calcium Carbonate-Vitamin D (CALCIUM + D PO) Take 1 tablet by mouth 2 (two) times daily. 630mg     . carbidopa-levodopa (SINEMET IR) 25-100 MG per tablet Take 1 tablet by mouth 3 (three) times daily. 270 tablet 3  . diclofenac sodium (VOLTAREN) 1 % GEL Apply 2 g topically 4 (four) times daily as  needed (For pain.).    Marland Kitchen metoprolol succinate (TOPROL-XL) 100 MG 24 hr tablet Take 1 tablet (100 mg total) by mouth daily. Take with or immediately following a meal. 90 tablet 0  . pantoprazole (PROTONIX) 40 MG tablet Take 1 tablet (40 mg total) by mouth daily. 30 tablet 3  . polyvinyl alcohol (LIQUIFILM TEARS) 1.4 % ophthalmic solution Place 1 drop into both eyes daily as needed for dry eyes.    . pregabalin (LYRICA) 75 MG capsule Take 75 mg by mouth 2 (two) times daily.     . rasagiline (AZILECT) 1 MG TABS tablet Take 1 tablet (1 mg total) by mouth daily. 90 tablet 3  . apixaban (ELIQUIS) 5 MG TABS tablet Take 1 tablet (5 mg total) by mouth 2 (two) times daily. 60 tablet 6    No results found for this or any previous visit (from the past 48 hour(s)). No results found.  Review of Systems  HENT: Negative.   Eyes: Negative.   Respiratory: Negative.   Cardiovascular: Negative.   Gastrointestinal: Negative.   Genitourinary: Negative.   Musculoskeletal: Positive for back pain.  Skin: Negative.   Neurological: Positive for tingling, sensory change, focal weakness and weakness.  Endo/Heme/Allergies: Negative.   Psychiatric/Behavioral: Negative.     Blood pressure 193/62, pulse 60, temperature 96.8 F (36 C), temperature source Oral, resp. rate 18, height 5' 3.5" (1.613 m), weight 54.114 kg (119 lb 4.8 oz), SpO2 98 %. Physical Exam  Constitutional: She is oriented to person, place, and time. She appears well-developed and well-nourished.  Frail appearing  HENT:  Head: Normocephalic and atraumatic.  Eyes: Conjunctivae and EOM are normal. Pupils are equal, round, and reactive to light.  Neck: Normal range of motion. Neck supple.  Cardiovascular: Normal rate and regular rhythm.   Respiratory: Effort normal and breath sounds normal.  GI: Soft. Bowel sounds are normal.  Musculoskeletal:  Severe lumbar scoliosis with apex to the left at L3-L4. Tender to palpation and percussion throughout  the lumbar spine particularly at the lumbosacral junction.  Neurological: She is alert and oriented to person, place, and time.  Motor function graded at 4-5 iliopsoas quadricep tibialis anterior and gastrocs tone and bulk are generally decreased absent reflexes in the patellae and Achilles both upper extremity reflexes are also subdued in the biceps triceps and brachioradialis.  Skin: Skin is warm and dry.  Psychiatric: She has a normal mood and affect. Her behavior is normal. Judgment and thought content normal.     Assessment/Plan Spondylosis and stenosis with degenerative scoliosis L3-L4.  Decompression L3-L4. With laminectomy.  Paidyn Mcferran  J 06/11/2015, 12:14 PM

## 2015-06-11 NOTE — Op Note (Signed)
Date of surgery: 06/11/2015 Preoperative diagnosis: Degenerative lumbar scoliosis, lumbar stenosis L3-L4 with left lumbar radiculopathy Postoperative diagnosis: Degenerative lumbar scoliosis, lumbar stenosis L3-L4 with left lumbar radiculopathy Procedure: Left sided laminotomy and foraminotomies and decompression of L3 and L4 nerve roots. Surgeon: Kristeen Miss M.D. Assistant: Francesca Jewett M.D. Anesthesia: Gen. endotracheal Indications: The patient is a 79 year old individual is had significant back and bilateral lower extremity pain she has evidence of left-sided stenosis of the severe at the apex of her scoliosis at L3-L4. This is due to marked facetal hypertrophy and involves mostly the L3 and the L4 nerve roots. Having failed multiple efforts at conservative management I advise a simple decompression via laminotomy and foraminotomies at L3-4 on the left side.  Procedure the patient was brought to the operating room supine on a stretcher. After the smooth induction of general endotracheal anesthesia, she was turned prone. The back was prepped with alcohol and DuraPrep and draped in a sterile fashion. L3-4 was localized with a radiograph. A linear incision was made over the L3-4 region and this was carried down to the lumbar dorsal fascia which was opened on the left side of the midline. Subperiosteal dissection was performed to expose interlaminar space at L3-L4. Then a high-speed drill was used to remove the anterior margin lamina of L3 out to the mesial wall the facet. Partial mesial facetectomy was performed. Yellow ligament and this region was then cleared. There is noted be significant mass of yellow ligamentous material in the lateral recess on the left side and this was cleared with a series of curettes and rongeurs. High-speed drill was used to further enlarge the laminotomy. Superior portion of the facet joint was taken up with a 2 mm Kerrison punch. The lateral recess was well decompressed as was  the path of the L3 nerve root superiorly and the L4 nerve root inferiorly. Once this dissection was completed hemostasis was achieved in the soft tissues care was taken to sound the L3 nerve root and found that was free and clearance travel out the foramen L4 nerve root was similarly well decompressed. Hemostasis was doubly checked and then the lumbar dorsal fascia was closed with #1 Vicryls interrupted fashion 20 Vicryls using the subcutaneous tissues 30 Vicryls to close subcutaneous or skin. Dermabond was placed on the skin. Blood loss was estimated at less than 50 mL.

## 2015-06-11 NOTE — Transfer of Care (Signed)
Immediate Anesthesia Transfer of Care Note  Patient: Dawn Ashley  Procedure(s) Performed: Procedure(s): LUMBAR LAMINECTOMY/DECOMPRESSION MICRODISCECTOMY LUMBAR THREE-FOUR (Left)  Patient Location: PACU  Anesthesia Type:General  Level of Consciousness: awake, alert , oriented and patient cooperative  Airway & Oxygen Therapy: Patient Spontanous Breathing and Patient connected to nasal cannula oxygen  Post-op Assessment: Report given to RN, Post -op Vital signs reviewed and stable and Patient moving all extremities X 4  Post vital signs: Reviewed and stable  Last Vitals:  Filed Vitals:   06/11/15 1016  BP: 193/62  Pulse: 60  Temp: 36 C  Resp: 18    Complications: No apparent anesthesia complications

## 2015-06-11 NOTE — Anesthesia Preprocedure Evaluation (Addendum)
Anesthesia Evaluation  Patient identified by MRN, date of birth, ID band Patient awake    Reviewed: Allergy & Precautions, NPO status , Patient's Chart, lab work & pertinent test results, reviewed documented beta blocker date and time   Airway Mallampati: III  TM Distance: >3 FB Neck ROM: Full    Dental  (+) Teeth Intact, Dental Advisory Given   Pulmonary neg pulmonary ROS,    Pulmonary exam normal breath sounds clear to auscultation       Cardiovascular hypertension, Pt. on medications and Pt. on home beta blockers Normal cardiovascular exam+ dysrhythmias (Patient on metoprolol and apixaban) Atrial Fibrillation + Valvular Problems/Murmurs MVP  Rhythm:Regular Rate:Normal  TTE 02/20/15: Study Conclusions  - Left ventricle: The cavity size was normal. Wall thickness wasincreased in a pattern of mild LVH. Systolic function was normal.The estimated ejection fraction was in the range of 60% to 65%.Doppler parameters are consistent with abnormal left ventricularrelaxation (grade 1 diastolic dysfunction). - Aortic valve: There was mild regurgitation. - Mitral valve: There was mild regurgitation. - Pericardium, extracardiac: A trivial pericardial effusion wasidentified.   Neuro/Psych PSYCHIATRIC DISORDERS Depression Parkinson disease   Neuromuscular disease    GI/Hepatic Neg liver ROS, GERD  Medicated,Pancreatic mass   Endo/Other  negative endocrine ROS  Renal/GU negative Renal ROS     Musculoskeletal  (+) Arthritis , Osteoarthritis,    Abdominal   Peds  Hematology  (+) Blood dyscrasia, anemia ,   Anesthesia Other Findings Day of surgery medications reviewed with the patient.  Reproductive/Obstetrics                          Anesthesia Physical Anesthesia Plan  ASA: III  Anesthesia Plan: General   Post-op Pain Management:    Induction: Intravenous  Airway Management Planned: Oral  ETT  Additional Equipment:   Intra-op Plan:   Post-operative Plan: Extubation in OR  Informed Consent: I have reviewed the patients History and Physical, chart, labs and discussed the procedure including the risks, benefits and alternatives for the proposed anesthesia with the patient or authorized representative who has indicated his/her understanding and acceptance.   Dental advisory given  Plan Discussed with: CRNA, Anesthesiologist and Surgeon  Anesthesia Plan Comments: (Risks/benefits of general anesthesia discussed with patient including risk of damage to teeth, lips, gum, and tongue, nausea/vomiting, allergic reactions to medications, and the possibility of heart attack, stroke and death.  All patient questions answered.  Patient wishes to proceed.)       Anesthesia Quick Evaluation

## 2015-06-11 NOTE — Anesthesia Postprocedure Evaluation (Signed)
  Anesthesia Post-op Note  Patient: Dawn Ashley  Procedure(s) Performed: Procedure(s) (LRB): LUMBAR LAMINECTOMY/DECOMPRESSION MICRODISCECTOMY LUMBAR THREE-FOUR (Left)  Patient Location: PACU  Anesthesia Type: General  Level of Consciousness: awake and alert   Airway and Oxygen Therapy: Patient Spontanous Breathing  Post-op Pain: mild  Post-op Assessment: Post-op Vital signs reviewed, Patient's Cardiovascular Status Stable, Respiratory Function Stable, Patent Airway and No signs of Nausea or vomiting  Last Vitals:  Filed Vitals:   06/11/15 1016  BP: 193/62  Pulse: 60  Temp: 36 C  Resp: 18    Post-op Vital Signs: stable   Complications: No apparent anesthesia complications

## 2015-06-11 NOTE — Anesthesia Procedure Notes (Signed)
Procedure Name: Intubation Date/Time: 06/11/2015 1:08 PM Performed by: Carney Living Pre-anesthesia Checklist: Patient identified, Emergency Drugs available, Suction available, Patient being monitored and Timeout performed Patient Re-evaluated:Patient Re-evaluated prior to inductionOxygen Delivery Method: Circle system utilized Preoxygenation: Pre-oxygenation with 100% oxygen Intubation Type: IV induction Ventilation: Mask ventilation without difficulty and Oral airway inserted - appropriate to patient size Laryngoscope Size: Mac and 4 Grade View: Grade I Tube type: Oral Tube size: 7.0 mm Number of attempts: 1 Airway Equipment and Method: Stylet Placement Confirmation: ETT inserted through vocal cords under direct vision,  positive ETCO2 and breath sounds checked- equal and bilateral Secured at: 21 cm Tube secured with: Tape Dental Injury: Teeth and Oropharynx as per pre-operative assessment

## 2015-06-12 ENCOUNTER — Encounter (HOSPITAL_COMMUNITY): Payer: Self-pay | Admitting: Neurological Surgery

## 2015-06-12 MED ORDER — HYDROCODONE-ACETAMINOPHEN 5-325 MG PO TABS: 1.0000 | ORAL_TABLET | ORAL | Status: DC | PRN

## 2015-06-12 MED ORDER — METHOCARBAMOL 500 MG PO TABS: 500.0000 mg | ORAL_TABLET | Freq: Four times a day (QID) | ORAL | Status: DC | PRN

## 2015-06-12 NOTE — Evaluation (Signed)
Occupational Therapy Evaluation Patient Details Name: Dawn Ashley MRN: [id number] DOB: [birth year] Today's Date: 06/12/2015    History of Present Illness 79 yo female s/p L 3-4 lumbar laminectomy / decompression microdiscecomty PMH: Parkinsons,  arthritis, Atrial Fibrillation, ORIF pelvic fx 09/2012, back surg 2009   Clinical Impression   Patient is s/p L3-4 laminectomy / decompression surgery resulting in functional limitations due to the deficits listed below (see OT problem list). PTA living at [place] apartment with plans per patient to d/c SNF [facility]. Patient will benefit from skilled OT acutely to increase independence and safety with ADLS to allow discharge SNF. Pt with fall risk and no recall of back precautions.     Follow Up Recommendations  SNF ([facility] noted as pt request)    Equipment Recommendations  Other (comment) (DEFER snf)    Recommendations for Other Services       Precautions / Restrictions Precautions Precautions: Back;Fall Precaution Comments: back handout provided and reviewed. pt with no recall of precautions Required Braces or Orthoses: Spinal Brace Spinal Brace:  (no brace)      Mobility Bed Mobility Overal bed mobility: Needs Assistance Bed Mobility: Supine to Sit     Supine to sit: Mod assist     General bed mobility comments: pt needed max cues for sequence  Transfers Overall transfer level: Needs assistance Equipment used: Rolling walker (2 wheeled) Transfers: Sit to/from Stand Sit to Stand: Min assist         General transfer comment: cues for hand placement and safety    Balance Overall balance assessment: Needs assistance         Standing balance support: Bilateral upper extremity supported;During functional activity Standing balance-Leahy Scale: Poor                              ADL Overall ADL's : Needs assistance/impaired     Grooming: Oral care;Wash/dry face;Wash/dry  hands;Minimal assistance;Standing Grooming Details (indicate cue type and reason): mod cueing for safety Upper Body Bathing: Minimal assitance   Lower Body Bathing: Moderate assistance           Toilet Transfer: Minimal assistance;Ambulation;Regular Toilet;Grab bars;RW Armed forces technical officer Details (indicate cue type and reason): cues for sequence and safety Toileting- Clothing Manipulation and Hygiene: Minimal assistance Toileting - Clothing Manipulation Details (indicate cue type and reason): pt voiding on pad and unaware that she did not pull pad all the way down with underwear. Pt attempting to wipe with pad still in place. Pt needed total (A) to doff pad     Functional mobility during ADLs: Minimal assistance General ADL Comments: Pt with cognitive deficits this AM question if medication induced. RN and tech made aware of fall risk and chair placed closer to the door for faster access to patient for medical staff. Pt's educated on need for assistance for all ambulation     Vision     Perception     Praxis      Pertinent Vitals/Pain Pain Assessment: Faces Faces Pain Scale: Hurts even more Pain Location: back Pain Descriptors / Indicators: Discomfort Pain Intervention(s): Monitored during session;Repositioned;Premedicated before session     Hand Dominance Right   Extremity/Trunk Assessment Upper Extremity Assessment Upper Extremity Assessment: Overall WFL for tasks assessed   Lower Extremity Assessment Lower Extremity Assessment: Defer to PT evaluation;LLE deficits/detail LLE Deficits / Details: guarding L LE. L knee flexion with static standing   Cervical / Trunk  Assessment Cervical / Trunk Assessment: Kyphotic   Communication Communication Communication: No difficulties   Cognition Arousal/Alertness: Awake/alert Behavior During Therapy: Flat affect Overall Cognitive Status: Impaired/Different from baseline Area of Impairment: Orientation;Memory;Following  commands;Safety/judgement;Awareness;Problem solving Orientation Level: Disoriented to;Time;Situation   Memory: Decreased short-term memory Following Commands: Follows one step commands with increased time Safety/Judgement: Decreased awareness of safety;Decreased awareness of deficits Awareness: Intellectual Problem Solving: Slow processing General Comments: pt startled with OT entering room and waking up stating "where am I?" Pt reoriented to location being [facility] and pt reports reason for admission is "kidney" pt reports her doctor is "Dr Ellene Route " when asked. Pt required reinforcement and education on back precautions and reason for admission is back surgery. Pt states "i am a little confused I think"   General Comments       Exercises       Shoulder Instructions      Home Living Family/patient expects to be discharged to:: Skilled nursing facility                                 Additional Comments: arrangements per patient for "[facility]"      Prior Functioning/Environment Level of Independence: Independent with assistive device(s)        Comments: ambulated to dining hall via elevator with RW     OT Diagnosis: Generalized weakness;Cognitive deficits;Acute pain   OT Problem List: Decreased strength;Decreased activity tolerance;Impaired balance (sitting and/or standing);Decreased cognition;Decreased knowledge of use of DME or AE;Decreased safety awareness;Decreased knowledge of precautions;Pain   OT Treatment/Interventions: Self-care/ADL training;Therapeutic exercise;Therapeutic activities;DME and/or AE instruction;Cognitive remediation/compensation;Patient/family education;Other (comment)    OT Goals(Current goals can be found in the care plan section) Acute Rehab OT Goals Patient Stated Goal: TO GET REHAB OT Goal Formulation: With patient Time For Goal Achievement: 06/26/15 Potential to Achieve Goals: Good  OT Frequency: Min 2X/week   Barriers to D/C:             Co-evaluation              End of Session Equipment Utilized During Treatment: Gait belt;Rolling walker Nurse Communication: Mobility status;Precautions  Activity Tolerance: Patient tolerated treatment well Patient left: in chair;with call bell/phone within reach (educated on the call bells system)   Time: 0728-0756 OT Time Calculation (min): 28 min Charges:  OT General Charges $OT Visit: 1 Procedure OT Evaluation $Initial OT Evaluation Tier I: 1 Procedure OT Treatments $Self Care/Home Management : 8-22 mins G-Codes:    Peri Maris Jun 17, 2015, 9:00 AM  Jeri Modena   OTR/L Pager: [phone] Office: [phone] .

## 2015-06-12 NOTE — Progress Notes (Signed)
Patient has SCD's in place as ordered. SCD was removed this am for patient to ambulate with physical therapy and sit in a recliner per patient request. SCD will be placed when patient get back in bed.

## 2015-06-12 NOTE — NC FL2 (Signed)
[place] LEVEL OF CARE SCREENING TOOL     IDENTIFICATION  Patient Name: Dawn Ashley Birthdate: 1935-08-07 Sex: female Admission Date (Current Location): 06/11/2015  [facility] and [place] Number: Herbalist and Address:  [facility] [place] [address] [place], [place] [address]      Provider Number: [id number]  Attending Physician Name and Address:  Kristeen Miss, MD  Relative Name and Phone Number:       Current Level of Care: Hospital Recommended Level of Care: [place] Prior Approval Number:    Date Approved/Denied:   PASRR Number: [id number]  Discharge Plan: SNF    Current Diagnoses: Patient Active Problem List   Diagnosis Date Noted  . Lumbar stenosis with neurogenic claudication 06/11/2015  . Pancreatic mass 02/16/2015  . Atrial fibrillation with RVR ([place]) 02/09/2015  . Chest pain with moderate risk for cardiac etiology 02/09/2015  . Osteoarthritis of left knee 10/26/2014  . Loss of weight 09/28/2014  . Right renal mass 04/21/2014  . GERD (gastroesophageal reflux disease) 02/09/2014  . Compression fracture of L1 lumbar vertebra (HCC) 08/25/2012  . Osteoporosis with fracture   . Parkinson disease ([place]) 12/12/2010  . HTN, white coat 12/12/2010    Orientation ACTIVITIES/SOCIAL BLADDER RESPIRATION    Self, Time, Situation, Place  Active Continent Normal  BEHAVIORAL SYMPTOMS/MOOD NEUROLOGICAL BOWEL NUTRITION STATUS      Continent Diet  PHYSICIAN VISITS COMMUNICATION OF NEEDS Height & Weight Skin    Verbally 5\' 3"  (160 cm) 119 lbs. Surgical wounds          AMBULATORY STATUS RESPIRATION     (limited assist) Normal      Personal Care Assistance Level of Assistance  Bathing, Dressing Bathing Assistance: Limited assistance   Dressing Assistance: Limited assistance      Functional Limitations Info                [place]  PT (By licensed PT)     PT  Frequency: 5/wk             Additional Factors Info  Code Status, Allergies Code Status Info: FULL             Current Medications (06/12/2015): Current Facility-Administered Medications  Medication Dose Route Frequency Provider Last Rate Last Dose  . acetaminophen (TYLENOL) tablet 650 mg  650 mg Oral Q4H PRN Kristeen Miss, MD       Or  . acetaminophen (TYLENOL) suppository 650 mg  650 mg Rectal Q4H PRN Kristeen Miss, MD      . alum & mag hydroxide-simeth (MAALOX/MYLANTA) 200-200-20 MG/5ML suspension 30 mL  30 mL Oral Q6H PRN Kristeen Miss, MD      . bisacodyl (DULCOLAX) suppository 10 mg  10 mg Rectal Daily PRN Kristeen Miss, MD      . carbidopa-levodopa (SINEMET IR) 25-100 MG per tablet immediate release 1 tablet  1 tablet Oral TID Kristeen Miss, MD   1 tablet at 06/12/15 1007  . docusate sodium (COLACE) capsule 100 mg  100 mg Oral BID Kristeen Miss, MD   100 mg at 06/12/15 1008  . HYDROcodone-acetaminophen (NORCO/VICODIN) 5-325 MG per tablet 1-2 tablet  1-2 tablet Oral Q4H PRN Kristeen Miss, MD      . HYDROmorphone (DILAUDID) injection 0.5-1 mg  0.5-1 mg Intravenous Q2H PRN Kristeen Miss, MD      . ketorolac (TORADOL) 15 MG/ML injection 15 mg  15 mg Intravenous 4 times per day  Kristeen Miss, MD   15 mg at 06/12/15 1216  . magnesium citrate solution 1 Bottle  1 Bottle Oral Once PRN Kristeen Miss, MD      . menthol-cetylpyridinium (CEPACOL) lozenge 3 mg  1 lozenge Oral PRN Kristeen Miss, MD       Or  . phenol (CHLORASEPTIC) mouth spray 1 spray  1 spray Mouth/Throat PRN Kristeen Miss, MD      . methocarbamol (ROBAXIN) tablet 500 mg  500 mg Oral Q6H PRN Kristeen Miss, MD   500 mg at 06/12/15 1008   Or  . methocarbamol (ROBAXIN) 500 mg in dextrose 5 % 50 mL IVPB  500 mg Intravenous Q6H PRN Kristeen Miss, MD      . metoprolol succinate (TOPROL-XL) 24 hr tablet 100 mg  100 mg Oral Daily Kristeen Miss, MD   100 mg at 06/12/15 1007  . ondansetron (ZOFRAN) injection 4 mg  4 mg Intravenous Q4H PRN Kristeen Miss, MD      . oxyCODONE-acetaminophen (PERCOCET/ROXICET) 5-325 MG per tablet 1-2 tablet  1-2 tablet Oral Q4H PRN Kristeen Miss, MD      . pantoprazole (PROTONIX) EC tablet 40 mg  40 mg Oral Daily Kristeen Miss, MD   40 mg at 06/12/15 1008  . polyethylene glycol (MIRALAX / GLYCOLAX) packet 17 g  17 g Oral Daily PRN Kristeen Miss, MD      . pregabalin (LYRICA) capsule 75 mg  75 mg Oral BID Kristeen Miss, MD   75 mg at 06/12/15 1007  . rasagiline (AZILECT) tablet 1 mg  1 mg Oral Daily Kristeen Miss, MD   1 mg at 06/12/15 1006  . senna (SENOKOT) tablet 8.6 mg  1 tablet Oral BID Kristeen Miss, MD   8.6 mg at 06/11/15 2210  . sodium chloride 0.9 % injection 3 mL  3 mL Intravenous Q12H Kristeen Miss, MD   3 mL at 06/11/15 2200  . sodium chloride 0.9 % injection 3 mL  3 mL Intravenous PRN Kristeen Miss, MD       Do not use this list as official medication orders. Please verify with discharge summary.  Discharge Medications:   Medication List    ASK your doctor about these medications        alendronate 70 MG tablet  Commonly known as:  FOSAMAX  TAKE 1 TABLET EVERY SATURDAY AT 6 P.M. TAKE WITH A FULL GLASS OF WATER ON AN EMPTY STOMACH     amoxicillin 500 MG capsule  Commonly known as:  AMOXIL  Before dental appt     apixaban 5 MG Tabs tablet  Commonly known as:  ELIQUIS  Take 1 tablet (5 mg total) by mouth 2 (two) times daily.     CALCIUM + D PO  Take 1 tablet by mouth 2 (two) times daily. 630mg      carbidopa-levodopa 25-100 MG tablet  Commonly known as:  SINEMET IR  Take 1 tablet by mouth 3 (three) times daily.     diclofenac sodium 1 % Gel  Commonly known as:  VOLTAREN  Apply 2 g topically 4 (four) times daily as needed (For pain.).     metoprolol succinate 100 MG 24 hr tablet  Commonly known as:  TOPROL-XL  Take 1 tablet (100 mg total) by mouth daily. Take with or immediately following a meal.     pantoprazole 40 MG tablet  Commonly known as:  PROTONIX  Take 1 tablet (40 mg  total) by mouth daily.  polyvinyl alcohol 1.4 % ophthalmic solution  Commonly known as:  LIQUIFILM TEARS  Place 1 drop into both eyes daily as needed for dry eyes.     pregabalin 75 MG capsule  Commonly known as:  LYRICA  Take 75 mg by mouth 2 (two) times daily.     rasagiline 1 MG Tabs tablet  Commonly known as:  AZILECT  Take 1 tablet (1 mg total) by mouth daily.        Relevant Imaging Results:  Relevant Lab Results:  Recent Labs    Additional Information    Cranford Mon, LCSW

## 2015-06-12 NOTE — Clinical Social Work Placement (Signed)
   CLINICAL SOCIAL WORK PLACEMENT  NOTE  Date:  06/12/2015  Patient Details  Name: Dawn Ashley MRN: [id number] Date of Birth: [birth year]  Clinical Social Work is seeking post-discharge placement for this patient at the [place] level of care (*CSW will initial, date and re-position this form in  chart as items are completed):  No (pt refused list)   Patient/family provided with [place] Work Department's list of facilities offering this level of care within the geographic area requested by the patient (or if unable, by the patient's family).  Yes   Patient/family informed of their freedom to choose among providers that offer the needed level of care, that participate in Medicare, Medicaid or managed care program needed by the patient, have an available bed and are willing to accept the patient.  Yes   Patient/family informed of [facility]'s ownership interest in [facility] and [facility], as well as of the fact that they are under no obligation to receive care at these facilities.  PASRR submitted to EDS on       PASRR number received on       Existing PASRR number confirmed on 06/12/15     FL2 transmitted to all facilities in geographic area requested by pt/family on 06/12/15     FL2 transmitted to all facilities within larger geographic area on       Patient informed that his/her managed care company has contracts with or will negotiate with certain facilities, including the following:            Patient/family informed of bed offers received.  Patient chooses bed at       Physician recommends and patient chooses bed at      Patient to be transferred to   on  .  Patient to be transferred to facility by       Patient family notified on   of transfer.  Name of family member notified:        PHYSICIAN Please sign FL2     Additional Comment:    _______________________________________________ Cranford Mon,  LCSW 06/12/2015, 2:52 PM

## 2015-06-12 NOTE — Progress Notes (Signed)
Patient ID: Dawn Ashley, female   DOB: Jan 26, 1936, 79 y.o.   MRN: [id number] Signs are stable Ambulation is going a bit better Still has left knee pain with weightbearing only but seems to be tolerating ambulation fairly well Aware that patient has a roommate [place] for discharge tomorrow Will prepare paperwork Review chart regarding left knee issues MRI of left knee was performed in May by Dr. Toney Reil Only findings are some moderate disc degenerative changes but no significant pathology otherwise

## 2015-06-12 NOTE — Evaluation (Addendum)
Physical Therapy Evaluation Patient Details Name: Dawn Ashley MRN: [id number] DOB: 05-21-1936 Today's Date: 06/12/2015   History of Present Illness  79 yo female s/p L 3-4 lumbar laminectomy / decompression microdiscecomty PMH: Parkinsons,  arthritis, Atrial Fibrillation, ORIF pelvic fx 09/2012, back surg 2009  Clinical Impression  Pt admitted with above diagnosis. Pt currently with functional limitations due to the deficits listed below (see PT Problem List).  Pt with no real complaints of back pain, but of L knee/quad pain with WB and stand > sit transfer rated at 7/10. At rest, pain 1/10. Pt with some situational confusion and needs cues for maintaining precautions.  Pt will benefit from skilled PT to increase their independence and safety with mobility to allow discharge to the venue listed below. Pt is hoping to go to [facility] because she has been there before for rehab.      Follow Up Recommendations SNF    Equipment Recommendations  None recommended by PT    Recommendations for Other Services       Precautions / Restrictions Precautions Precautions: Back;Fall Precaution Comments: Reviewed precautions. Recalled 2/3 with cueing.  Decreased recall of earlier session with OT. Required Braces or Orthoses: Spinal Brace Spinal Brace:  (no brace)      Mobility  Bed Mobility Overal bed mobility: Needs Assistance Bed Mobility: Supine to Sit     Supine to sit: Mod assist     General bed mobility comments: Pt up in recliner upon arrival  Transfers Overall transfer level: Needs assistance Equipment used: Rolling walker (2 wheeled) Transfers: Sit to/from Stand Sit to Stand: Min guard;Mod assist         General transfer comment: cues for scooting forward and for hand placement- MIn/guard for standing.  With stand > sit, pt with increase L knee pain to 7/10 and needed A to complete transfer and for hand placement  Ambulation/Gait Ambulation/Gait assistance: Min  guard Ambulation Distance (Feet): 110 Feet Assistive device: Rolling walker (2 wheeled) Gait Pattern/deviations: Decreased step length - right;Decreased step length - left;Decreased weight shift to left;Narrow base of support;Antalgic Gait velocity: decreased   General Gait Details: Pt with decreased step length B'ly with R foot turned inward.  L knee flexion throughout all phases of gait.  When cued to extend during stance phase pt reports increased L knee pain to 7/10.  Stairs            Wheelchair Mobility    Modified Rankin (Stroke Patients Only)       Balance Overall balance assessment: Needs assistance Sitting-balance support: Feet supported       Standing balance support: Bilateral upper extremity supported Standing balance-Leahy Scale: Poor                               Pertinent Vitals/Pain Pain Assessment: 0-10 Pain Score: 7  Faces Pain Scale: Hurts even more Pain Location: L quad and knee Pain Descriptors / Indicators: Aching;Sore;Discomfort Pain Intervention(s): Monitored during session;Repositioned    Home Living Family/patient expects to be discharged to:: Skilled nursing facility Living Arrangements: Alone               Additional Comments: Hoping to go to [employer]    Prior Function Level of Independence: Independent with assistive device(s)         Comments: ambulated to dining hall via elevator with rollator     Hand Dominance   Dominant Hand: Right  Extremity/Trunk Assessment   Upper Extremity Assessment: Defer to OT evaluation           Lower Extremity Assessment: LLE deficits/detail;RLE deficits/detail RLE Deficits / Details: R foot turned inward LLE Deficits / Details: hip  flex 4/5 ankle/quad 4+/5, but difficulty extending in standing/WB due to pain  Cervical / Trunk Assessment: Other exceptions;Kyphotic  Communication   Communication: No difficulties  Cognition Arousal/Alertness:  Awake/alert Behavior During Therapy: WFL for tasks assessed/performed Overall Cognitive Status: Within Functional Limits for tasks assessed Area of Impairment: Orientation;Memory;Following commands;Safety/judgement;Awareness;Problem solving Orientation Level: Disoriented to;Time;Situation   Memory: Decreased recall of precautions;Decreased short-term memory Following Commands: Follows one step commands with increased time Safety/Judgement: Decreased awareness of safety;Decreased awareness of deficits Awareness: Intellectual Problem Solving: Slow processing General Comments: Pt seems slightly confused. Mixing up different events from hospital stay.  Decreased recall of earlier session with OT, but when shown precaution sheet she did remember getting it from OT.    General Comments General comments (skin integrity, edema, etc.): Demonstrated ways to incorporate body mechanics into transfers and gait.    Exercises        Assessment/Plan    PT Assessment    PT Diagnosis Difficulty walking;Acute pain   PT Problem List  decreased strength, decreased mobility, decreased balance, pain  PT Treatment Interventions  Gait training, therex, there activity, balance training, precaution education   PT Goals (Current goals can be found in the Care Plan section) Acute Rehab PT Goals Patient Stated Goal: to go to rehab -preferably [facility] because she has been there before PT Goal Formulation: With patient Time For Goal Achievement: 06/19/15 Potential to Achieve Goals: Good    Frequency  5x/week   Barriers to discharge        Co-evaluation               End of Session Equipment Utilized During Treatment: Gait belt Activity Tolerance: Patient tolerated treatment well;Patient limited by pain Patient left: in chair;with call bell/phone within reach Nurse Communication: Mobility status         Time: 9163-8466 PT Time Calculation (min) (ACUTE ONLY): 23 min   Charges:   PT  Evaluation $Initial PT Evaluation Tier I: 1 Procedure PT Treatments $Gait Training: 8-22 mins   PT G Codes:        Neythan Kozlov LUBECK 06/12/2015, 9:54 AM

## 2015-06-12 NOTE — Progress Notes (Signed)
PT Note: Pt L LE tests at 4 to 4+/5 in sitting. During ambulation, she maintains L knee flexion during all phases of gait with guarding present throughout.  When instructed to extend L knee during stance phase, pain increases to 7/10 in quad and knee.  Pt also with 7/10 pain with stand to sit and had difficulty with this transfer.  She denies back pain.  At rest pain 1/0 in L LE.  Reports intermittent tingling in L distal quad.  Will continue to monitor response of L LE from surgery. Thank you for this referral.  Dawn Ashley L. Dawn Ashley, [place] Pager [phone] 06/12/2015

## 2015-06-12 NOTE — Clinical Social Work Note (Signed)
Clinical Social Work Assessment  Patient Details  Name: Dawn Ashley MRN: [id number] Date of Birth: 12/29/1935  Date of referral:  06/12/15               Reason for consult:  Facility Placement                Permission sought to share information with:  Chartered certified accountant granted to share information::  Yes, Verbal Permission Granted  Name::        Agency::  [facility], [facility]  Relationship::     Contact Information:     Housing/Transportation Living arrangements for the past 2 months:  [place] of Information:  Patient Patient Interpreter Needed:  None Criminal Activity/Legal Involvement Pertinent to Current Situation/Hospitalization:  No - Comment as needed Significant Relationships:  Adult Children, Friend Lives with:  Facility Resident Do you feel safe going back to the place where you live?  No (not given current mobility) Need for family participation in patient care:  No ([place]) (pt is independent in decision making/care)  Care giving concerns:  Patient lives in [facility] [facility] facility, [facility], and does not think its safe to return given current mobility- would not have 24 hour assistance   Facilities manager / plan:  CSW spoke with pt about PT recommendation for SNF.  Employment status:  Retired Nurse, adult PT Recommendations:  [place] / Referral to community resources:  [place]  Patient/Family's Response to care:  Pt is agreeable to SNF- states she has been to [facility] for rehab in the past and would like to return if possible.  Patient/Family's Understanding of and Emotional Response to Diagnosis, Current Treatment, and Prognosis:  Pt had no questions or concerns with current treatment/prognosis.  Pt is hopeful to return to her friends at her Independent Living facility soon.  Emotional  Assessment Appearance:  Appears stated age, Well-Groomed Attitude/Demeanor/Rapport:    Affect (typically observed):  Appropriate, Pleasant Orientation:  Oriented to Self, Oriented to Place, Oriented to  Time, Oriented to Situation Alcohol / Substance use:  Not Applicable Psych involvement (Current and /or in the community):  No (Comment)  Discharge Needs  Concerns to be addressed:  Care Coordination Readmission within the last 30 days:  No Current discharge risk:  Physical Impairment, Lives alone Barriers to Discharge:  Continued Medical Work up   [employer], LCSW 06/12/2015, 2:47 PM

## 2015-06-12 NOTE — Discharge Summary (Signed)
Physician Discharge Summary  Patient ID: Dawn Ashley MRN: [id number] DOB/AGE: 77-Jun-1937 77 y.o.  Admit date: 06/11/2015 Discharge date: 06/12/2015  Admission Diagnoses: Degenerative lumbar scoliosis, lumbar stenosis with neurogenic claudication, left lumbar radiculopathy  Discharge Diagnoses: Degenerative lumbar scoliosis, lumbar stenosis with neurogenic claudication, left lumbar radiculopathy. Left knee pain secondary to arthritis left knee Active Problems:   Lumbar stenosis with neurogenic claudication   Discharged Condition: fair  Hospital Course: Patient was admitted to undergo surgical decompression on the left side at L3-L4. She tolerated surgery well. She is a bit debilitated and will require some inpatient rehabilitation and is discharged to [facility].  Consults: None  Significant Diagnostic Studies: None  Treatments: surgery: Laminectomy L3-4 left with decompression of L3 and L4 nerve roots  Discharge Exam: Blood pressure 117/43, pulse 80, temperature 98 F (36.7 C), temperature source Oral, resp. rate 20, height 5' 3.5" (1.613 m), weight 54.114 kg (119 lb 4.8 oz), SpO2 93 %. Incision is clean and dry motor function is intact in lower extremities. Left knee tender on medial aspect palpation.  Disposition: Skilled nursing facility, [facility]  Discharge Instructions    Call MD for:  redness, tenderness, or signs of infection (pain, swelling, redness, odor or green/yellow discharge around incision site)    Complete by:  As directed      Call MD for:  severe uncontrolled pain    Complete by:  As directed      Call MD for:  temperature >100.4    Complete by:  As directed      Diet - low sodium heart healthy    Complete by:  As directed      Discharge instructions    Complete by:  As directed   Okay to shower. Do not apply salves or appointments to incision. No heavy lifting with the upper extremities greater than 15 pounds.     Increase activity slowly     Complete by:  As directed             Medication List    TAKE these medications        alendronate 70 MG tablet  Commonly known as:  FOSAMAX  TAKE 1 TABLET EVERY SATURDAY AT 6 P.M. TAKE WITH A FULL GLASS OF WATER ON AN EMPTY STOMACH     amoxicillin 500 MG capsule  Commonly known as:  AMOXIL  Before dental appt     apixaban 5 MG Tabs tablet  Commonly known as:  ELIQUIS  Take 1 tablet (5 mg total) by mouth 2 (two) times daily.     CALCIUM + D PO  Take 1 tablet by mouth 2 (two) times daily. 630mg      carbidopa-levodopa 25-100 MG tablet  Commonly known as:  SINEMET IR  Take 1 tablet by mouth 3 (three) times daily.     diclofenac sodium 1 % Gel  Commonly known as:  VOLTAREN  Apply 2 g topically 4 (four) times daily as needed (For pain.).     HYDROcodone-acetaminophen 5-325 MG tablet  Commonly known as:  NORCO/VICODIN  Take 1-2 tablets by mouth every 4 (four) hours as needed (mild pain).     methocarbamol 500 MG tablet  Commonly known as:  ROBAXIN  Take 1 tablet (500 mg total) by mouth every 6 (six) hours as needed for muscle spasms.     metoprolol succinate 100 MG 24 hr tablet  Commonly known as:  TOPROL-XL  Take 1 tablet (100 mg total) by mouth daily.  Take with or immediately following a meal.     pantoprazole 40 MG tablet  Commonly known as:  PROTONIX  Take 1 tablet (40 mg total) by mouth daily.     polyvinyl alcohol 1.4 % ophthalmic solution  Commonly known as:  LIQUIFILM TEARS  Place 1 drop into both eyes daily as needed for dry eyes.     pregabalin 75 MG capsule  Commonly known as:  LYRICA  Take 75 mg by mouth 2 (two) times daily.     rasagiline 1 MG Tabs tablet  Commonly known as:  AZILECT  Take 1 tablet (1 mg total) by mouth daily.         SignedEarleen Newport 06/12/2015, 5:16 PM

## 2015-06-13 ENCOUNTER — Telehealth: Payer: Self-pay | Admitting: *Deleted

## 2015-06-13 NOTE — Care Management Note (Signed)
Case Management Note  Patient Details  Name: Dawn Ashley MRN: [id number] Date of Birth: 1935/11/05  Subjective/Objective:    Laminectomy L3-L4 with decompression                Action/Plan: Dc to SNF for rehab.  Expected Discharge Date:  06/13/2015               Expected Discharge Plan:  Skilled Nursing Facility  In-House Referral:  Clinical Social Work  Discharge planning Services  CM Consult  Post Acute Care Choice:    Choice offered to:     DME Arranged:    DME Agency:     HH Arranged:    [place] Agency:     Status of Service:  Completed, signed off  Medicare Important Message Given:    Date Medicare IM Given:    Medicare IM give by:    Date Additional Medicare IM Given:    Additional Medicare Important Message give by:     If discussed at [place] of Stay Meetings, dates discussed:    Additional Comments: Chart reviewed. UR complete.  Erenest Rasher, RN 06/13/2015, 3:24 PM

## 2015-06-13 NOTE — Telephone Encounter (Signed)
Pt was on TCM list d/c 06/12/15 sent to [facility]...[name]

## 2015-06-13 NOTE — Progress Notes (Signed)
Pt. discharged to [facility]. Report called in to [facility]. Prescriptions and discharge instructions given with verbalization of understanding. Incision site on back with no s/s of infection - no swelling, redness, bleeding, and/or drainage noted. Opportunity given to ask questions but no question asked. Pt. transported out of this unit by [facility]. Patient's son aware of patient discharged to [facility] and instruction also given to patient's son.

## 2015-06-13 NOTE — Progress Notes (Signed)
Physical Therapy Treatment Patient Details Name: Dawn Ashley MRN: [id number] DOB: 15-Jan-1936 Today's Date: 06/13/2015    History of Present Illness 79 yo female s/p L 3-4 lumbar laminectomy / decompression microdiscecomty PMH: Parkinsons,  arthritis, Atrial Fibrillation, ORIF pelvic fx 09/2012, back surg 2009    PT Comments    Pt able to recall 2/3 back precautions and perform bed mobility with MIN A and cues for proper body mechanics.  Con't to recommend SNF for short term rehab.  Follow Up Recommendations  SNF     Equipment Recommendations  None recommended by PT    Recommendations for Other Services       Precautions / Restrictions Precautions Precautions: Back;Fall Precaution Comments: recalled 2/3 precautions without cueing    Mobility  Bed Mobility Overal bed mobility: Needs Assistance Bed Mobility: Rolling;Sidelying to Sit Rolling: Min assist Sidelying to sit: Min assist       General bed mobility comments: Cues for proper body mechanics.  Transfers Overall transfer level: Needs assistance Equipment used: Rolling walker (2 wheeled) Transfers: Sit to/from Stand Sit to Stand: Min guard         General transfer comment: Cues for technique.  Improved stand >sit with decrease pain with transfer  Ambulation/Gait Ambulation/Gait assistance: Min guard Ambulation Distance (Feet): 125 Feet Assistive device: Rolling walker (2 wheeled) Gait Pattern/deviations: Decreased stance time - left;Decreased step length - right;Narrow base of support;Trunk flexed Gait velocity: decreased   General Gait Details: Pt with decreased steps with decreased L knee extension in stance phase.  R foot turned inward and scolosis with flexed posture with cueing to look up.   Stairs            Wheelchair Mobility    Modified Rankin (Stroke Patients Only)       Balance Overall balance assessment: Needs assistance Sitting-balance support: Feet supported Sitting  balance-Leahy Scale: Good     Standing balance support: Bilateral upper extremity supported Standing balance-Leahy Scale: Poor                      Cognition Arousal/Alertness: Awake/alert Behavior During Therapy: WFL for tasks assessed/performed Overall Cognitive Status: Within Functional Limits for tasks assessed       Memory: Decreased recall of precautions Following Commands: Follows one step commands consistently;Follows one step commands with increased time     Problem Solving: Requires verbal cues;Requires tactile cues      Exercises      General Comments General comments (skin integrity, edema, etc.): Pt seems clearer today.      Pertinent Vitals/Pain Pain Assessment: 0-10 Pain Score: 6  Pain Location: L knee and L lower back Pain Descriptors / Indicators: Aching;Sore Pain Intervention(s): Limited activity within patient's tolerance;Monitored during session    Home Living                      Prior Function            PT Goals (current goals can now be found in the care plan section) Acute Rehab PT Goals Patient Stated Goal: to go to rehab -preferably [facility] because she has been there before PT Goal Formulation: With patient Time For Goal Achievement: 06/19/15 Potential to Achieve Goals: Good Progress towards PT goals: Progressing toward goals    Frequency  Min 5X/week    PT Plan Current plan remains appropriate    Co-evaluation             End  of Session Equipment Utilized During Treatment: Gait belt Activity Tolerance: Patient tolerated treatment well;Patient limited by pain Patient left: in chair;with call bell/phone within reach     Time: 1210-1224 PT Time Calculation (min) (ACUTE ONLY): 14 min  Charges:  $Gait Training: 8-22 mins                    G Codes:      Lenard Kampf LUBECK 06/13/2015, 12:45 PM

## 2015-06-13 NOTE — Progress Notes (Signed)
PT Cancellation Note  Patient Details Name: Dawn Ashley MRN: [id number] DOB: [birth year]   Cancelled Treatment:    Reason Eval/Treat Not Completed: Patient at procedure or test/unavailable. Pt eating breakfast.  Will check back as schedule permits.     Ivery Michalski LUBECK 06/13/2015, 9:48 AM

## 2015-06-13 NOTE — Progress Notes (Signed)
Patient will discharge to [facility] Anticipated discharge date: 06/13/15 Transportation by [facility]- scheduled 1:45pm  CSW signing off.  Domenica Reamer, [place] Social Worker [phone]

## 2015-06-15 ENCOUNTER — Encounter: Payer: Self-pay | Admitting: Internal Medicine

## 2015-06-15 NOTE — Progress Notes (Deleted)
Patient ID: Dawn Ashley, female   DOB: 07-03-1936, 79 y.o.   MRN: [id number]     [place]  PCP: Hoyt Koch, MD  Code Status: DNR  Allergies  Allergen Reactions  . Gabapentin Other (See Comments)    sleepy  . Tramadol Other (See Comments)    Confusion and sleepy    Chief Complaint  Patient presents with  . New Admit To SNF    New Admission      HPI:  79 y.o. patient is here for short term rehabilitation post hospital admission from   Review of Systems:  Constitutional: Negative for fever, chills, malaise/fatigue and diaphoresis.  HENT: Negative for headache, congestion, nasal discharge, hearing loss, earache, sore throat, difficulty swallowing.   Eyes: Negative for eye pain, blurred vision, double vision and discharge.  Respiratory: Negative for cough, shortness of breath and wheezing.   Cardiovascular: Negative for chest pain, palpitations, leg swelling.  Gastrointestinal: Negative for heartburn, nausea, vomiting, abdominal pain, loss of appetite, melena, diarrhea and constipation.  Genitourinary: Negative for dysuria, urgency, frequency, hematuria, incontinence and flank pain.  Musculoskeletal: Negative for back pain, falls, joint pain and myalgias. assistive device used Skin: Negative for itching, sores and rash.  Neurological: Negative for weakness,dizziness, tingling, focal weakness Psychiatric/Behavioral: Negative for depression, anxiety, insomnia and memory loss.    Past Medical History  Diagnosis Date  . Parkinson disease ([place])     takes Sinemet daily  . Back pain     left lumbar radiculopathy and scoliosis  . Heart murmur     h/o MVP, pt. reports murmur is almost resolved  . Arthritis     osteoporosis, osteoarthritis   . Swelling     legs / feet - takes Lasix to control  . Renal mass, right   . Frequency of urination   . MVP (mitral valve prolapse)     takes Amoxicillin before dental appointments  . Atrial fibrillation  ([place])     takes Eliquis daily as well as Metoprolol  . Osteoporosis     takes Fosamax weekly  . Depression     takes Azilect daily  . GERD (gastroesophageal reflux disease)     takes Protonix daily  . History of vertigo     no meds  . Peripheral neuropathy ([facility])     takes Lyrica daily  . Bruises easily     from being on Eliquis  . History of kidney stones   . Nocturia   . Anemia    Past Surgical History  Procedure Laterality Date  . Cataract extraction w/ intraocular lens  implant, bilateral Bilateral   . Dilation and curettage of uterus  2012  . Back surgery  2009    spurs resting on nerve, lumbar  . Total hip arthroplasty Right 09/20/2012    Procedure: TOTAL HIP ARTHROPLASTY;  Surgeon: Kerin Salen, MD;  Location: [facility];  Service: Orthopedics;  Laterality: Right;  . Orif pelvic fracture Right 09/20/2012    Procedure: OPEN REDUCTION INTERNAL FIXATION (ORIF) PELVIC FRACTURE;  Surgeon: Kerin Salen, MD;  Location: [facility];  Service: Orthopedics;  Laterality: Right;  . Bone spurs removed form spine 2007  2007  . Kidney surgery Right 05/2014    growth removed  . Tonsillectomy    . Colonoscopy    . Lumbar laminectomy/decompression microdiscectomy Left 06/11/2015    Procedure: LUMBAR LAMINECTOMY/DECOMPRESSION MICRODISCECTOMY LUMBAR THREE-FOUR;  Surgeon: Kristeen Miss, MD;  Location: [facility];  Service: Neurosurgery;  Laterality: Left;   Social History:   reports that she has never smoked. She has never used smokeless tobacco. She reports that she does not drink alcohol or use illicit drugs.  Family History  Problem Relation Age of Onset  . Breast cancer Maternal Grandmother   . Arthritis Maternal Grandmother   . Hypertension Other   . Osteoporosis Other   . Breast cancer Mother     had mastectomy in 1982  . Heart disease Mother   . Stroke Mother     mild, 2001  . Hypertension Mother   . Osteoporosis Mother     also had spine surgery 1987, & hip repalcement  . Arthritis  Sister   . Hypertension Sister   . Parkinsonism Maternal Uncle   . Arthritis Maternal Grandfather     Medications:   Medication List       This list is accurate as of: 06/15/15 11:48 AM.  Always use your most recent med list.               alendronate 70 MG tablet  Commonly known as:  FOSAMAX  TAKE 1 TABLET EVERY SATURDAY AT 6 P.M. TAKE WITH A FULL GLASS OF WATER ON AN EMPTY STOMACH     amoxicillin 500 MG capsule  Commonly known as:  AMOXIL  Before dental appt     apixaban 5 MG Tabs tablet  Commonly known as:  ELIQUIS  Take 1 tablet (5 mg total) by mouth 2 (two) times daily.     CALCIUM + D PO  Take 1 tablet by mouth 2 (two) times daily. 630mg      carbidopa-levodopa 25-100 MG tablet  Commonly known as:  SINEMET IR  Take 1 tablet by mouth 3 (three) times daily.     HYDROcodone-acetaminophen 5-325 MG tablet  Commonly known as:  NORCO/VICODIN  Take 1-2 tablets by mouth every 4 (four) hours as needed (mild pain).     methocarbamol 500 MG tablet  Commonly known as:  ROBAXIN  Take 1 tablet (500 mg total) by mouth every 6 (six) hours as needed for muscle spasms.     metoprolol succinate 100 MG 24 hr tablet  Commonly known as:  TOPROL-XL  Take 1 tablet (100 mg total) by mouth daily. Take with or immediately following a meal.     pantoprazole 40 MG tablet  Commonly known as:  PROTONIX  Take 1 tablet (40 mg total) by mouth daily.     polyvinyl alcohol 1.4 % ophthalmic solution  Commonly known as:  LIQUIFILM TEARS  Place 1 drop into both eyes daily as needed for dry eyes.     pregabalin 75 MG capsule  Commonly known as:  LYRICA  Take 75 mg by mouth 2 (two) times daily.     rasagiline 1 MG Tabs tablet  Commonly known as:  AZILECT  Take 1 tablet (1 mg total) by mouth daily.         Physical Exam: *** Filed Vitals:   06/15/15 1139  BP: 118/61  Pulse: 79  Temp: 98.1 F (36.7 C)  TempSrc: Oral  Resp: 18  SpO2: 97%    General- elderly female, well  built, in no acute distress Head- normocephalic, atraumatic Ears- left ear normal tympanic membrane and normal external ear canal , right ear normal tympanic membrane and normal external ear canal Nose- normal nasal mucosa, no maxillary or frontal sinus tenderness, no nasal discharge Throat- moist mucus membrane, normal oropharynx, dentition is  Eyes- PERRLA, EOMI,  no pallor, no icterus, no discharge, normal conjunctiva, normal sclera Neck- no cervical lymphadenopathy, no supraclavicular lymphadenopathy, no thyromegaly, no jugular vein distension, no carotid bruit Chest- no chest wall deformities, no chest wall tenderness Breast- normal appearance, no masses or lumps on palpation, normal nipple and areola exam, no axillary lymphadenopathy Cardiovascular- normal s1,s2, no murmurs/ rubs/ gallops, dorsalis pedis and radial pulses, leg edema Respiratory- bilateral clear to auscultation, no wheeze, no rhonchi, no crackles, no use of accessory muscles Abdomen- bowel sounds present, soft, non tender, no organomegaly, no abdominal bruits, no guarding or rigidity, no CVA tenderness Pelvic exam- normal pelvic exam, no adenexal or cervical motion tenderness Musculoskeletal- able to move all 4 extremities, no spinal and paraspinal tenderness, normal back curvature, steady gait, no use of assistive device, normal range of motion Neurological- no focal deficit, alert and oriented to person, place and time, normal reflexes, normal muscle strength, normal sensation to fine touch and vibration Skin- warm and dry Nails- hypertrophy, ingrown Psychiatry- normal mood and affect    Labs reviewed: Basic Metabolic Panel:  Recent Labs  01/26/15 0853 02/09/15 1105 06/04/15 1041  NA  --  142 143  K  --  3.9 3.8  CL  --  110 108  CO2  --  26 26  GLUCOSE  --  104* 97  BUN 29* 31* 21*  CREATININE 0.84 0.86 0.81  CALCIUM  --  9.3 9.6   Liver Function Tests: No results for input(s): AST, ALT, ALKPHOS, BILITOT,  PROT, ALBUMIN in the last 8760 hours. No results for input(s): LIPASE, AMYLASE in the last 8760 hours. No results for input(s): AMMONIA in the last 8760 hours. CBC:  Recent Labs  02/09/15 1105 02/26/15 0853 06/04/15 1041  WBC 5.7 5.8 5.5  HGB 10.0* 10.8* 10.9*  HCT 32.8* 35.1* 34.6*  MCV 91.6 90.9 92.5  PLT 144* 206 172   Cardiac Enzymes: No results for input(s): CKTOTAL, CKMB, CKMBINDEX, TROPONINI in the last 8760 hours. BNP: Invalid input(s): POCBNP CBG: No results for input(s): GLUCAP in the last 8760 hours.  Radiological Exams: No results found.  EKG: Independently reviewed. ***  Assessment/Plan No problem-specific assessment & plan notes found for this encounter.      Goals of care: short term rehabilitation   Labs/tests ordered:  Family/ staff Communication: reviewed care plan with patient and nursing supervisor    Blanchie Serve, MD  [facility] [phone] (Monday-Friday 8 am - 5 pm) [phone] (afterhours)

## 2015-06-19 ENCOUNTER — Non-Acute Institutional Stay (SKILLED_NURSING_FACILITY): Payer: Medicare Other | Admitting: Internal Medicine

## 2015-06-19 DIAGNOSIS — I4891 Unspecified atrial fibrillation: Secondary | ICD-10-CM

## 2015-06-19 DIAGNOSIS — M48062 Spinal stenosis, lumbar region with neurogenic claudication: Secondary | ICD-10-CM

## 2015-06-19 DIAGNOSIS — M4806 Spinal stenosis, lumbar region: Secondary | ICD-10-CM

## 2015-06-19 DIAGNOSIS — T814XXA Infection following a procedure, initial encounter: Secondary | ICD-10-CM | POA: Diagnosis not present

## 2015-06-19 DIAGNOSIS — M8080XS Other osteoporosis with current pathological fracture, unspecified site, sequela: Secondary | ICD-10-CM | POA: Diagnosis not present

## 2015-06-19 DIAGNOSIS — IMO0001 Reserved for inherently not codable concepts without codable children: Secondary | ICD-10-CM

## 2015-06-19 DIAGNOSIS — D62 Acute posthemorrhagic anemia: Secondary | ICD-10-CM | POA: Diagnosis not present

## 2015-06-19 DIAGNOSIS — K219 Gastro-esophageal reflux disease without esophagitis: Secondary | ICD-10-CM

## 2015-06-19 DIAGNOSIS — E46 Unspecified protein-calorie malnutrition: Secondary | ICD-10-CM | POA: Diagnosis not present

## 2015-06-19 DIAGNOSIS — G20 Parkinson's disease: Secondary | ICD-10-CM | POA: Diagnosis not present

## 2015-06-19 DIAGNOSIS — R2681 Unsteadiness on feet: Secondary | ICD-10-CM | POA: Diagnosis not present

## 2015-06-19 NOTE — Progress Notes (Signed)
This encounter was created in error - please disregard.

## 2015-06-19 NOTE — Progress Notes (Signed)
Patient ID: Dawn Ashley, female   DOB: 01-Feb-1936, 79 y.o.   MRN: [id number]     [facility]   PCP: Hoyt Koch, MD  Code Status: DNR  Allergies  Allergen Reactions  . Gabapentin Other (See Comments)    sleepy  . Tramadol Other (See Comments)    Confusion and sleepy    Chief Complaint  Patient presents with  . New Admit To SNF     HPI:  79 y.o. patient is here for short term rehabilitation post hospital admission from 06/11/15-06/12/15 with lumbar stenosis with radiculopathy. She underwent laminectomy of L3-4 with decompression of L3-4. She is seen in her room today. Her pain is under control with current pain regimen. Movement exacerbates it some but she clinically feels improved since prior to surgery. Her bowel movement has been regulated.   Review of Systems:  Constitutional: Negative for fever, chills, diaphoresis.  HENT: Negative for headache, congestion, nasal discharge.  Eyes: Negative for eye pain, blurred vision, double vision and discharge.  Respiratory: Negative for cough, shortness of breath and wheezing.   Cardiovascular: Negative for chest pain, palpitations, leg swelling.  Gastrointestinal: Negative for heartburn, nausea, vomiting, abdominal pain. Bowel movement last night Genitourinary: Negative for dysuria, flank pain.  Musculoskeletal: Negative for falls in the facility Skin: Negative for itching, rash.  Neurological: Negative for dizziness, tingling, focal weakness. Positive for generalized weakness. Psychiatric/Behavioral: Negative for depression.    Past Medical History  Diagnosis Date  . Parkinson disease ([place])     takes Sinemet daily  . Back pain     left lumbar radiculopathy and scoliosis  . Heart murmur     h/o MVP, pt. reports murmur is almost resolved  . Arthritis     osteoporosis, osteoarthritis   . Swelling     legs / feet - takes Lasix to control  . Renal mass, right   . Frequency of  urination   . MVP (mitral valve prolapse)     takes Amoxicillin before dental appointments  . Atrial fibrillation ([place])     takes Eliquis daily as well as Metoprolol  . Osteoporosis     takes Fosamax weekly  . Depression     takes Azilect daily  . GERD (gastroesophageal reflux disease)     takes Protonix daily  . History of vertigo     no meds  . Peripheral neuropathy ([facility])     takes Lyrica daily  . Bruises easily     from being on Eliquis  . History of kidney stones   . Nocturia   . Anemia    Past Surgical History  Procedure Laterality Date  . Cataract extraction w/ intraocular lens  implant, bilateral Bilateral   . Dilation and curettage of uterus  2012  . Back surgery  2009    spurs resting on nerve, lumbar  . Total hip arthroplasty Right 09/20/2012    Procedure: TOTAL HIP ARTHROPLASTY;  Surgeon: Kerin Salen, MD;  Location: [facility];  Service: Orthopedics;  Laterality: Right;  . Orif pelvic fracture Right 09/20/2012    Procedure: OPEN REDUCTION INTERNAL FIXATION (ORIF) PELVIC FRACTURE;  Surgeon: Kerin Salen, MD;  Location: [facility];  Service: Orthopedics;  Laterality: Right;  . Bone spurs removed form spine 2007  2007  . Kidney surgery Right 05/2014    growth removed  . Tonsillectomy    . Colonoscopy    . Lumbar laminectomy/decompression microdiscectomy Left 06/11/2015    Procedure:  LUMBAR LAMINECTOMY/DECOMPRESSION MICRODISCECTOMY LUMBAR THREE-FOUR;  Surgeon: Kristeen Miss, MD;  Location: [facility];  Service: Neurosurgery;  Laterality: Left;   Social History:   reports that she has never smoked. She has never used smokeless tobacco. She reports that she does not drink alcohol or use illicit drugs.  Family History  Problem Relation Age of Onset  . Breast cancer Maternal Grandmother   . Arthritis Maternal Grandmother   . Hypertension Other   . Osteoporosis Other   . Breast cancer Mother     had mastectomy in 1982  . Heart disease Mother   . Stroke Mother     mild,  2001  . Hypertension Mother   . Osteoporosis Mother     also had spine surgery 1987, & hip repalcement  . Arthritis Sister   . Hypertension Sister   . Parkinsonism Maternal Uncle   . Arthritis Maternal Grandfather     Medications:   Medication List       This list is accurate as of: 06/19/15 12:15 PM.  Always use your most recent med list.               alendronate 70 MG tablet  Commonly known as:  FOSAMAX  TAKE 1 TABLET EVERY SATURDAY AT 6 P.M. TAKE WITH A FULL GLASS OF WATER ON AN EMPTY STOMACH     amoxicillin 500 MG capsule  Commonly known as:  AMOXIL  Before dental appt     apixaban 5 MG Tabs tablet  Commonly known as:  ELIQUIS  Take 1 tablet (5 mg total) by mouth 2 (two) times daily.     CALCIUM + D PO  Take 1 tablet by mouth 2 (two) times daily. 630mg      carbidopa-levodopa 25-100 MG tablet  Commonly known as:  SINEMET IR  Take 1 tablet by mouth 3 (three) times daily.     HYDROcodone-acetaminophen 5-325 MG tablet  Commonly known as:  NORCO/VICODIN  Take 1-2 tablets by mouth every 4 (four) hours as needed (mild pain).     methocarbamol 500 MG tablet  Commonly known as:  ROBAXIN  Take 1 tablet (500 mg total) by mouth every 6 (six) hours as needed for muscle spasms.     metoprolol succinate 100 MG 24 hr tablet  Commonly known as:  TOPROL-XL  Take 1 tablet (100 mg total) by mouth daily. Take with or immediately following a meal.     pantoprazole 40 MG tablet  Commonly known as:  PROTONIX  Take 1 tablet (40 mg total) by mouth daily.     polyvinyl alcohol 1.4 % ophthalmic solution  Commonly known as:  LIQUIFILM TEARS  Place 1 drop into both eyes daily as needed for dry eyes.     pregabalin 75 MG capsule  Commonly known as:  LYRICA  Take 75 mg by mouth 2 (two) times daily.     rasagiline 1 MG Tabs tablet  Commonly known as:  AZILECT  Take 1 tablet (1 mg total) by mouth daily.         Physical Exam: Filed Vitals:   06/19/15 1214  BP: 118/61    Pulse: 79  Temp: 98.1 F (36.7 C)  Resp: 18  SpO2: 97%    General- elderly female, well built, in no acute distress Head- normocephalic, atraumatic Nose- normal nasal mucosa, no maxillary or frontal sinus tenderness, no nasal discharge Throat- moist mucus membrane Eyes- PERRLA, EOMI, no pallor, no icterus, no discharge, normal conjunctiva, normal sclera Neck-  no cervical lymphadenopathy Cardiovascular- normal s1,s2, no murmurs, palpable dorsalis pedis and radial pulses, no leg edema Respiratory- bilateral clear to auscultation, no wheeze, no rhonchi, no crackles, no use of accessory muscles Abdomen- bowel sounds present, soft, non tender Musculoskeletal- able to move all 4 extremities, generalized weakness and unsteady gait Neurological- no focal deficit, alert and oriented to person, place and time Skin- warm and dry, some yellow drainage noted on her dressing, when removed surgical incision with glue, erythema and induration around upper incision area noted.  Psychiatry- normal mood and affect    Labs reviewed: Basic Metabolic Panel:  Recent Labs  01/26/15 0853 02/09/15 1105 06/04/15 1041  NA  --  142 143  K  --  3.9 3.8  CL  --  110 108  CO2  --  26 26  GLUCOSE  --  104* 97  BUN 29* 31* 21*  CREATININE 0.84 0.86 0.81  CALCIUM  --  9.3 9.6   CBC:  Recent Labs  02/09/15 1105 02/26/15 0853 06/04/15 1041  WBC 5.7 5.8 5.5  HGB 10.0* 10.8* 10.9*  HCT 32.8* 35.1* 34.6*  MCV 91.6 90.9 92.5  PLT 144* 206 172     Assessment/Plan  Unsteady gait Will have patient work with PT/OT as tolerated to regain strength and restore function.  Fall precautions are in place.  Lumbar spinal stenosis S/p lumbar decompression and laminectomy. Will need to make f/u appointment with neurosurgery. Continue norco 5-325 mg 1-2 tab q4h prn pain with robaxin 500 mg q6h prn muscle spasm. Back precautions. Will have her work with physical therapy and occupational therapy team to help  with gait training and muscle strengthening exercises.fall precautions. Skin care. Encourage to be out of bed.   Surgical incision infection Start doxycycline 100 mg q12h for 1 week, will have neurosurgery assess further. Get cbc with diff and cmp in am. Monitor vital signs  Blood loss anemia Monitor cbc  Protein calorie malnutrition Stable, continue procel supplementafib Rate controlled. Continue toprol xl 100 mg daily and eliquis   Osteoporosis Fall precautions. Continue weekly fosamax with her calcium vitamin d supplement  Parkinson's disease Continue sinemet IR, rasagiline and lyrica home regimen, no changes made  gerd Continue protonix 40 mg daily, symptom controlled   Goals of care: short term rehabilitation   Labs/tests ordered: cbc with diff  Family/ staff Communication: reviewed care plan with patient and nursing supervisor    Blanchie Serve, MD  [facility] [phone] (Monday-Friday 8 am - 5 pm) [phone] (afterhours)

## 2015-07-11 ENCOUNTER — Other Ambulatory Visit: Payer: Self-pay

## 2015-07-11 DIAGNOSIS — Z1231 Encounter for screening mammogram for malignant neoplasm of breast: Secondary | ICD-10-CM

## 2015-07-16 ENCOUNTER — Ambulatory Visit: Payer: Medicare Other | Admitting: Neurology

## 2015-07-16 ENCOUNTER — Telehealth: Payer: Self-pay

## 2015-07-16 ENCOUNTER — Ambulatory Visit: Payer: Medicare Other | Admitting: Internal Medicine

## 2015-07-16 NOTE — Telephone Encounter (Signed)
Patient cancelled same day as appt.  

## 2015-07-18 ENCOUNTER — Telehealth: Payer: Self-pay | Admitting: Neurology

## 2015-07-18 NOTE — Telephone Encounter (Signed)
Patient's son is calling. He states the patient is hallucinating and that she needs around the clock care. The patient has an appointment scheduled for next Tuesday but he feels like the patient needs to be seen sooner. He is very concerned. Please call.

## 2015-07-18 NOTE — Telephone Encounter (Signed)
I spoke to son, we had a cancellation for tomorrow and were able to move appt to tomorrow. Son is aware if there is an emergency to call 911 or take patient to ED.

## 2015-07-19 ENCOUNTER — Ambulatory Visit (INDEPENDENT_AMBULATORY_CARE_PROVIDER_SITE_OTHER): Payer: Medicare Other | Admitting: Neurology

## 2015-07-19 ENCOUNTER — Encounter: Payer: Self-pay | Admitting: Neurology

## 2015-07-19 VITALS — BP 132/72 | HR 72 | Resp 16

## 2015-07-19 DIAGNOSIS — Z9660 Presence of unspecified orthopedic joint implant: Secondary | ICD-10-CM

## 2015-07-19 DIAGNOSIS — M545 Low back pain, unspecified: Secondary | ICD-10-CM

## 2015-07-19 DIAGNOSIS — R600 Localized edema: Secondary | ICD-10-CM | POA: Diagnosis not present

## 2015-07-19 DIAGNOSIS — M25551 Pain in right hip: Secondary | ICD-10-CM | POA: Diagnosis not present

## 2015-07-19 DIAGNOSIS — G20 Parkinson's disease: Secondary | ICD-10-CM

## 2015-07-19 DIAGNOSIS — Z96649 Presence of unspecified artificial hip joint: Secondary | ICD-10-CM

## 2015-07-19 DIAGNOSIS — M4120 Other idiopathic scoliosis, site unspecified: Secondary | ICD-10-CM

## 2015-07-19 MED ORDER — CARBIDOPA-LEVODOPA 25-100 MG PO TABS: 1.0000 | ORAL_TABLET | Freq: Four times a day (QID) | ORAL | Status: DC

## 2015-07-19 NOTE — Progress Notes (Signed)
Subjective:    Patient ID: Dawn Ashley is a 79 y.o. female.  HPI     Interim history:   Dawn Ashley is a 79 year-old [place] lady, with an underlying medical history of carotid artery disease, lumbar spine disease with multilevel degenerative joint disease and hypertrophic facet arthritis, s/p back surgery in 2008 (under Dr. Patrice Paradise in [facility]), cataract surgery and R THR on 09/20/2012 (Dr. Hal Morales), who presents for followup consultation of her L sided predominant Parkinson's disease, which was diagnosed in 2006 with symptoms dating back to a few years prior. The patient is accompanied by her son today. Of note, she had canceled her appointment for 07/16/2015 and her son then requested a sooner appointment. I last saw her on 03/22/2015, at which time she reported ongoing issues with LBP and L thigh pain. She sees Dr. Ellene Route for this. She had injections into her lower back and knee, which she felt were not helpful. She was no longer and mobile and had taken oral steroids as well. She was on Lyrica. She was diagnosed with A. fib in July 2016. She was actually at her urologist's office when she was noted to have an irregular pulse. She was sent to the emergency room on 02/09/15 and she was found to be in new onset A. fib with RVR. She was started on Eliquis and saw Dr. Percival Spanish. Parkinson's-wise, she felt quite stable. She had no recent falls. She did have a controlled forced sit down event as she missed a chair one time. She was using her walker at all times. I suggested she continue with Sinemet 3 times a day and Azilect once daily. She was off of Mirapex.  Today, 07/19/2015: She reports still being in pain. Unfortunately, she had some setbacks since I last saw her. She was able to go through a lower back surgery on 06/11/2015. She had significant issues with low back pain, she has a history of lumbar spinal stenosis and scoliosis an ongoing issues with left knee pain. On 06/11/15 she had Laminectomy L3-4 left  with decompression of L3 and L4 nerve roots. She was then an inpatient rehabilitation at [facility] for about 3 weeks. She was to the point where she was able to walk with a walker and assistance with the therapist. She has been at her own place at [facility] an independent living but started having hallucinations, these became significantly worse a couple of nights ago where she thought people were in the house and her room she felt was in disarray. 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 He organized for a sitter around-the-clock with the help of a sitter service through [employer]. She has not taken any hydrocodone yesterday and also stopped her Robaxin. She had also had more constipation when she was on the pain medication. Unfortunately, she has significant pain affecting her left leg particularly the left thigh and her left knee. She has seen Dr. Ellene Route and follow-up and is scheduled to have another x-ray of her left hip and left knee today. Her situation is complicated by the pain limiting her mobility. As far as her Parkinson's medications go she still is on Azilect once daily and Sinemet 3 times a day. She does not always drink enough water. Appetite is good, sleep is reasonable but she is tired during the day. She can no longer walk with a walker. She has been confined to her wheelchair at this point.  Previously:  I saw her  on 11/07/2014, at which time she reported being in physical therapy at [facility]. She had seen her spine doctor. She was on an oral prednisone taper. She had noticed more reflux symptoms. She was taking Mobic. She had a left knee sterilely injection which she felt was not very helpful. She reported bilateral low back pain without radiation. She felt that the increase in Sinemet to 1 pill 3 times a day was somewhat helpful but not dramatically. She felt that her bladder hyperactivity was controlled on Myrbetriq. She had lost some weight. She had started  drinking some boost for supplement. Her son worried about her memory loss. When she had her hip replacement surgery her son had taken over her finances and she found it hard to go back into it. I suggested we continue with the same medications. She was reminded to use her walker at all times.  I saw her on 07/07/2014, at which time she was doing well, and she denied any side effects from Sinemet. She was doing physical therapy at [facility]. Mood and memory were stable. She had no recent falls. She was supposed to spend her holidays with her 2 sons, one in [place] and the other in [place], both with 2 children each. She was using her walker or cane. She had lower back injection with a follow-up scheduled. She was on a bladder medication. I asked her to discontinue Mirapex altogether due to lower extremity swelling. I increased her Sinemet to one full pill 3 times a day.  In the interim, she presented to her primary care physician on 10/26/2014 with left knee pain. This was deemed secondary to osteoarthritis. I reviewed the office records from Dr. Doug Sou. She had steroid injection into her left knee.  Prior to that she presented to urgent care on 10/21/2014 with similar complaints of left knee pain and also low back pain. She had a lumbar spine x-ray on 10/21/2014 which I reviewed as well as pelvis x-ray in 2 views on 10/21/2014 which also reviewed: No evidence of acute abnormality. L1 and L4 compression fractures again noted as well as severe thoracolumbar scoliosis and multilevel degenerative changes. No fracture or acute finding. Right hip prosthesis is well aligned no evidence of significant loosening.    I saw her on 03/20/2014, at which time she reported no significant exacerbation of symptoms. She had stopped gabapentin as she felt too sleepy with it. She was in the home health physical therapy at the time. At the time of her last visit I asked her to reduce Mirapex because of lower  extremity swelling noted. I introduced a small dose of Sinemet starting with half a pill twice daily with increase to half a pill 3 times a day.   I saw her on 09/19/2013, at which time she reported having reduced her gabapentin. She was started on Gabapentin in 11/13, by her sports medicine doctor and was up to 300 mg tid, but has had sleepiness and blurry vision with it. She was scheduled for a back injection on 10/06/13 under Dr. Mina Marble. Dr. Mayer Camel is her orthopedic doctor. She had started PT at [facility], 3 times a week. She felt stable with her PD Sx, and has never been on C/L. I kept her on Mirapex and Azilect. In March she called back reporting swelling and redness in her toes. I suggested she have it checked out by her primary care physician as it may indicate several issues such as with circulation, venous stasis, or cellulitis.  She has been seeing her primary care physician for lower extremity swelling.  I saw her on 03/08/2013, at which time I felt she was quite stable and that she had recovered well from her recent total hip replacement surgery. She was complaining of daytime somnolence and I suggested changing her Mirapex immediate release to long-acting. However, she called back a few days later reporting that the long-acting Mirapex was not covered by her insurance and we changed her back to immediate release Mirapex.    I first met her on 12/06/12 at which time I did not change her medications. She previously followed with Dr. Morene Antu and was last seen by him on 09/08/2012, which time he felt that her sleepiness was in part d/t tramadol and stopped it. He also obtained blood work including CBC, CMP, TSH and urinalysis as well as B12 level and ordered a brain MRI. She had problems with her gait which he primarily attributed to right hip pain.   Her blood work showed mild elevated alkaline phosphatase and BUN of 28 and creatinine of 1.17. Her son was called back and advised that she should  increase her fluid intake. Her urinalysis was not consistent with clear-cut UTI. Since her last visit with Dr. Erling Cruz she had right hip replacement surgery on 09/20/12. She has been in rehabilitation. She did not end up getting her brain scan because she had surgery soon after her last visit and was in rehabilitation since then. Her sleepiness and confusion improved after her pain medication was changed from tramadol to diclofenac. She moved to [place]. She had developed some LE swelling and was switched from HCTZ to Lasix and advised to reduce her NSAIDs.    Her Past Medical History Is Significant For: Past Medical History  Diagnosis Date  . Parkinson disease ([place])     takes Sinemet daily  . Back pain     left lumbar radiculopathy and scoliosis  . Heart murmur     h/o MVP, pt. reports murmur is almost resolved  . Arthritis     osteoporosis, osteoarthritis   . Swelling     legs / feet - takes Lasix to control  . Renal mass, right   . Frequency of urination   . MVP (mitral valve prolapse)     takes Amoxicillin before dental appointments  . Atrial fibrillation ([place])     takes Eliquis daily as well as Metoprolol  . Osteoporosis     takes Fosamax weekly  . Depression     takes Azilect daily  . GERD (gastroesophageal reflux disease)     takes Protonix daily  . History of vertigo     no meds  . Peripheral neuropathy ([facility])     takes Lyrica daily  . Bruises easily     from being on Eliquis  . History of kidney stones   . Nocturia   . Anemia     Her Past Surgical History Is Significant For: Past Surgical History  Procedure Laterality Date  . Cataract extraction w/ intraocular lens  implant, bilateral Bilateral   . Dilation and curettage of uterus  2012  . Back surgery  2009/ 12/16    spurs resting on nerve, lumbar  . Total hip arthroplasty Right 09/20/2012    Procedure: TOTAL HIP ARTHROPLASTY;  Surgeon: Kerin Salen, MD;  Location: [facility];  Service:  Orthopedics;  Laterality: Right;  . Orif pelvic fracture Right 09/20/2012    Procedure: OPEN REDUCTION INTERNAL FIXATION (  ORIF) PELVIC FRACTURE;  Surgeon: Kerin Salen, MD;  Location: [facility];  Service: Orthopedics;  Laterality: Right;  . Bone spurs removed form spine 2007  2007  . Kidney surgery Right 05/2014    growth removed  . Tonsillectomy    . Colonoscopy    . Lumbar laminectomy/decompression microdiscectomy Left 06/11/2015    Procedure: LUMBAR LAMINECTOMY/DECOMPRESSION MICRODISCECTOMY LUMBAR THREE-FOUR;  Surgeon: Kristeen Miss, MD;  Location: [facility];  Service: Neurosurgery;  Laterality: Left;    Her Family History Is Significant For: Family History  Problem Relation Age of Onset  . Breast cancer Maternal Grandmother   . Arthritis Maternal Grandmother   . Hypertension Other   . Osteoporosis Other   . Breast cancer Mother     had mastectomy in 1982  . Heart disease Mother   . Stroke Mother     mild, 2001  . Hypertension Mother   . Osteoporosis Mother     also had spine surgery 1987, & hip repalcement  . Arthritis Sister   . Hypertension Sister   . Parkinsonism Maternal Uncle   . Arthritis Maternal Grandfather     Her Social History Is Significant For: Social History   Social History  . Marital Status: Widowed    Spouse Name: N/A  . Number of Children: 2  . Years of Education: 12   Occupational History  .      retired   Social History Main Topics  . Smoking status: Never Smoker   . Smokeless tobacco: Never Used  . Alcohol Use: No  . Drug Use: No  . Sexual Activity: No   Other Topics Concern  . None   Social History Narrative   Patient is right handed, resides alone    Her Allergies Are:  Allergies  Allergen Reactions  . Gabapentin Other (See Comments)    sleepy  . Tramadol Other (See Comments)    Confusion and sleepy  :   Her Current Medications Are:  Outpatient Encounter Prescriptions as of 07/19/2015  Medication Sig  . alendronate  (FOSAMAX) 70 MG tablet TAKE 1 TABLET EVERY SATURDAY AT 6 P.M. TAKE WITH A FULL GLASS OF WATER ON AN EMPTY STOMACH  . amoxicillin (AMOXIL) 500 MG capsule Before dental appt  . apixaban (ELIQUIS) 5 MG TABS tablet Take 1 tablet (5 mg total) by mouth 2 (two) times daily.  . Calcium Carbonate-Vitamin D (CALCIUM + D PO) Take 1 tablet by mouth 2 (two) times daily. 660m  . carbidopa-levodopa (SINEMET IR) 25-100 MG per tablet Take 1 tablet by mouth 3 (three) times daily.  .Marland KitchenHYDROcodone-acetaminophen (NORCO/VICODIN) 5-325 MG tablet Take 1-2 tablets by mouth every 4 (four) hours as needed (mild pain).  . methocarbamol (ROBAXIN) 500 MG tablet Take 1 tablet (500 mg total) by mouth every 6 (six) hours as needed for muscle spasms.  . metoprolol succinate (TOPROL-XL) 100 MG 24 hr tablet Take 1 tablet (100 mg total) by mouth daily. Take with or immediately following a meal.  . pantoprazole (PROTONIX) 40 MG tablet Take 1 tablet (40 mg total) by mouth daily.  . polyvinyl alcohol (LIQUIFILM TEARS) 1.4 % ophthalmic solution Place 1 drop into both eyes daily as needed for dry eyes.  . pregabalin (LYRICA) 75 MG capsule Take 75 mg by mouth 2 (two) times daily.   . rasagiline (AZILECT) 1 MG TABS tablet Take 1 tablet (1 mg total) by mouth daily.   No facility-administered encounter medications on file as of 07/19/2015.  :  Review of Systems:  Out of a complete 14 point review of systems, all are reviewed and negative with the exception of these symptoms as listed below:  Review of Systems  Neurological:       Patient is here for f/u. Per son patient has had increased hallucinations and has started to drop things more.     Objective:  Neurologic Exam  Physical Exam Physical Examination:   Filed Vitals:   07/19/15 1307  BP: 132/72  Pulse: 72  Resp: 16   General Examination: The patient is a very pleasant 79 y.o. female in no acute distress.she is situated in her wheelchair. She is leaning to the left. She  does not have any active hallucinations.  HEENT: Normocephalic, atraumatic, pupils are equal, round and reactive to light and accommodation. Extraocular tracking shows mild saccadic breakdown without nystagmus noted. There is no limitation to her gaze. There is mild decrease in eye blink rate. Hearing is intact. Face is symmetric with mild facial masking and normal facial sensation. There is no lip, neck or jaw tremor. Neck is mildly rigid with intact passive ROM. There are no carotid bruits on auscultation. Oropharynx exam reveals moderate to severe mouth dryness. No significant airway crowding is noted. Mallampati is class II. Tongue protrudes centrally and palate elevates symmetrically. There is no drooling.   Chest: is clear to auscultation without wheezing, rhonchi or crackles noted.  Heart: sounds are regular and normal without murmurs, rubs or gallops noted.   Abdomen: is soft, non-tender and non-distended with normal bowel sounds appreciated on auscultation.  Extremities: There is1+ pitting edema in both lower extremities. This is a little worse on the right. This has overall become worse than last time.    Skin: is warm and dry with no trophic changes noted.  Musculoskeletal: exam reveals: she reports low back pain. She has evidence of scoliosis and kyphosis. She has left knee pain.   Neurologically:  Mental status: The patient is awake and alert, paying good attention. She is able to provide some of her history but details are provided by her son. She is oriented to: person, place, situation, day of week, month of year and year. Her memory, attention, language and knowledge are fairly well preserved but she has trouble relating some of her historical details. There is no aphasia, agnosia, apraxia or anomia. There is a no bradyphrenia. Speech is moderately hypophonic with no dysarthria noted. Mood is congruent and affect is normal.   Cranial nerves are as described above under HEENT exam.  In addition, shoulder shrug is normal with equal shoulder height noted.   Motor exam: Normal bulk, and strength for age is noted. There are no dyskinesias noted. Tone is mildly rigid with absence of cogwheeling in the bilateral extremities. There is overall mild to moderate bradykinesia. There is no drift or rebound. There is a very mild intermittent left upper extremity resting tremor, unchanged. She has no other tremors. She did not bring her 4 wheeled walker with seat today. Reflexes are 1+ in the upper extremities and 1+ in the lower extremities. Fine motor skills exam reveals: mild to moderate impairment bilaterally, left side more moderately impaired, right-sided little better..    Cerebellar testing shows no dysmetria or intention tremor on finger to nose testing. Heel to shin is not possible on the L.   Sensory exam is intact to light touch in the UEs and LEs.    Gait, station and balance exam: She stands up with Significant difficulty  and requires assistance. She does push herself up but cannot let go of her wheelchair armrests. She has a moderately stooped posture with kyphoscoliosis and leans to the L. After a few moments of standing, she starts feeling a pain that affects her left anterior thigh and knee area. She took a couple of short steps but felt that the pain would not let her walk anymore. I pulled the wheelchair back behind her.  Assessment and Plan:   In summary, ZANOBIA GRIEBEL is a very pleasant 79 year old female with a history of left-sided predominant Parkinson's disease diagnosed in 2006 but with symptoms dating back to a few years prior to that. She has had a fairly slow course over the years. She has in the interim been diagnosed with new onset A. Fib and was placed on Eliquis. She has in the interim had low back surgery on 06/11/2015. I reviewed the discharge summary. She has had some problems with residual pain unfortunately. She had hallucinations while on pain medication  and Robaxin. I think her situation is complicated because of her advancing age and Parkinson's disease as well as recent surgery and medication side effects. I would like for her to be off of any sedating pain medications or muscle relaxants as much is possible but pain limitation is a big problem at this time. She has remained fairly stable motor-wise, nevertheless, I suggested we try her on a slightly higher dose of Sinemet, one pill 4 times a day at 4 hourly intervals, at 8, 12, 4 PM and 8 PM. For constipation I suggested that she increase her water intake and use an as needed laxative and over-the-counter stool softener. For her back pain and knee pain she has had x-rays and injection treatment and had been on Mobic, all of which essentially failed and ultimately she needed surgery. Unfortunately, she is still in significant amount of pain. She has been on Lyrica twice daily. She will see Dr. Ellene Route and follow up soon and is supposed to have additional x-rays today. I would like to see her back in 3 months, sooner if needed. I have asked her son to keep me posted via email or phone calls as well. I adjusted her Sinemet prescription today. I answered all her questions today and they were in agreement. I spent 25 minutes in total face-to-face time with the patient, more than 50% of which was spent in counseling and coordination of care, reviewing test results, reviewing medication and discussing or reviewing the diagnosis of PD, arthritis pain, the prognosis and treatment options.

## 2015-07-19 NOTE — Patient Instructions (Signed)
We will try an increased dose of Sinemet to 1 pill 4 times a day, at 8, 12, 4 PM and 8 PM, your sitter can remind you.   I think your pain is a big limitation, but narcotic pain medication can cause worsening of hallucinations and constipation.   Drink plenty of water, about 6 glasses a day.   Eat healthy, nutritious and fiber rich food.   [place] a stool softener and if needed a laxative.

## 2015-07-24 ENCOUNTER — Ambulatory Visit: Payer: Medicare Other | Admitting: Neurology

## 2015-07-25 ENCOUNTER — Telehealth: Payer: Self-pay | Admitting: Neurology

## 2015-07-25 ENCOUNTER — Encounter: Payer: Self-pay | Admitting: Neurology

## 2015-07-25 NOTE — Telephone Encounter (Signed)
Please call [facility] to give VO for Sinemet 25-100 milligrams strength: one pill 4 times a day at 4 hourly intervals, at 8, 12, 4 PM and 8 PM

## 2015-07-25 NOTE — Telephone Encounter (Signed)
See email sent from My chart. Rn replied to the message per Dr. Rexene Alberts.

## 2015-07-26 ENCOUNTER — Other Ambulatory Visit: Payer: Self-pay | Admitting: Cardiology

## 2015-07-26 NOTE — Telephone Encounter (Signed)
Rx(s) sent to pharmacy electronically.  

## 2015-08-01 ENCOUNTER — Ambulatory Visit (INDEPENDENT_AMBULATORY_CARE_PROVIDER_SITE_OTHER): Payer: Medicare Other | Admitting: Geriatric Medicine

## 2015-08-01 DIAGNOSIS — Z111 Encounter for screening for respiratory tuberculosis: Secondary | ICD-10-CM | POA: Diagnosis not present

## 2015-08-03 ENCOUNTER — Ambulatory Visit
Admission: RE | Admit: 2015-08-03 | Discharge: 2015-08-03 | Disposition: A | Discharge status: Home / Self Care | Payer: Medicare Other | Source: Ambulatory Visit

## 2015-08-03 ENCOUNTER — Ambulatory Visit (INDEPENDENT_AMBULATORY_CARE_PROVIDER_SITE_OTHER): Payer: Medicare Other | Admitting: *Deleted

## 2015-08-03 ENCOUNTER — Other Ambulatory Visit: Payer: Self-pay | Admitting: Internal Medicine

## 2015-08-03 DIAGNOSIS — Z1231 Encounter for screening mammogram for malignant neoplasm of breast: Secondary | ICD-10-CM

## 2015-08-03 DIAGNOSIS — Z23 Encounter for immunization: Secondary | ICD-10-CM

## 2015-08-03 LAB — TB SKIN TEST
INDURATION: 0 mm
TB SKIN TEST: NEGATIVE

## 2015-08-03 MED ORDER — SULFAMETHOXAZOLE-TRIMETHOPRIM 800-160 MG PO TABS: 1.0000 | ORAL_TABLET | Freq: Two times a day (BID) | ORAL | Status: DC

## 2015-08-08 ENCOUNTER — Encounter: Payer: Self-pay | Admitting: Internal Medicine

## 2015-08-08 ENCOUNTER — Other Ambulatory Visit: Payer: Self-pay | Admitting: Geriatric Medicine

## 2015-08-08 MED ORDER — PANTOPRAZOLE SODIUM 40 MG PO TBEC: 40.0000 mg | DELAYED_RELEASE_TABLET | Freq: Every day | ORAL | Status: DC

## 2015-08-09 MED ORDER — MIRABEGRON ER 50 MG PO TB24: 50.0000 mg | ORAL_TABLET | Freq: Every day | ORAL | Status: AC

## 2015-08-21 ENCOUNTER — Encounter: Payer: Self-pay | Admitting: Internal Medicine

## 2015-08-22 ENCOUNTER — Encounter: Payer: Self-pay | Admitting: Internal Medicine

## 2015-08-22 ENCOUNTER — Other Ambulatory Visit (HOSPITAL_COMMUNITY): Payer: Self-pay | Admitting: Interventional Radiology

## 2015-08-22 ENCOUNTER — Other Ambulatory Visit: Payer: Self-pay | Admitting: Radiology

## 2015-08-22 DIAGNOSIS — N2889 Other specified disorders of kidney and ureter: Secondary | ICD-10-CM

## 2015-08-22 NOTE — Telephone Encounter (Signed)
Please check with facility if received, it was filled out and should have been faxed yesterday. If not refax form.

## 2015-08-23 NOTE — Telephone Encounter (Signed)
Amy can you call a verbal order for voltaren gel 2g applied TID prn for pain.

## 2015-08-27 ENCOUNTER — Encounter: Payer: Self-pay | Admitting: Internal Medicine

## 2015-08-28 IMAGING — DX DG CHEST 1V PORT
1 series · 1 of 1 positions shown · non-contrast
Comparison: 04/21/2014

CLINICAL DATA: Follow-up small right pneumothorax status post right
renal mass cryoablation.

EXAM:
PORTABLE CHEST - 1 VIEW

[chest ap]
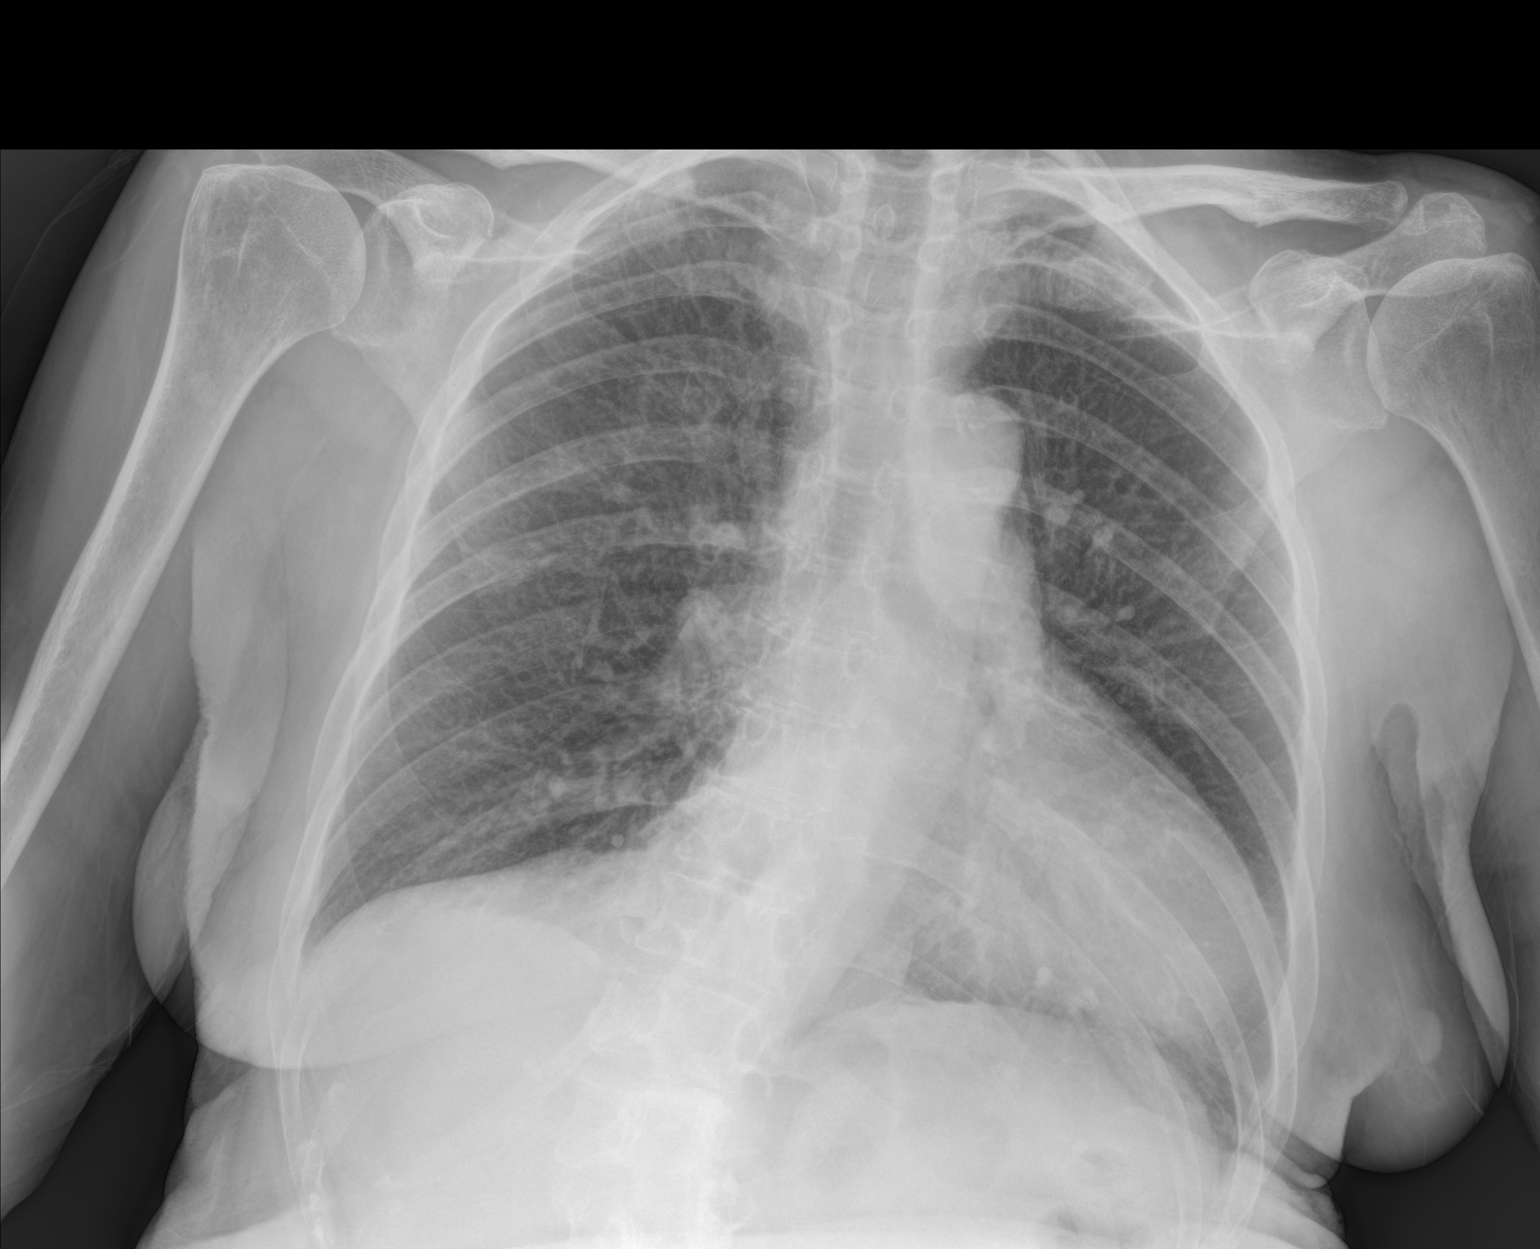

[1 of 1 positions shown; findings below may reference images not displayed]

FINDINGS: The cardiac silhouette remains mildly enlarged. Lungs are well
inflated and clear. No pleural effusion or pneumothorax is
identified. Thoracolumbar dextroscoliosis is partially visualized.
IMPRESSION: No pneumothorax identified.

## 2015-09-13 ENCOUNTER — Ambulatory Visit: Payer: Medicare Other | Admitting: Cardiology

## 2015-09-18 ENCOUNTER — Encounter: Payer: Self-pay | Admitting: Neurology

## 2015-09-21 ENCOUNTER — Ambulatory Visit: Payer: Medicare Other | Admitting: Cardiology

## 2015-09-27 NOTE — Progress Notes (Signed)
Cardiology Office Note   Date:  09/28/2015   ID:  Laxmi, Mazzara 1936-06-05, MRN [id number]  PCP:  Hoyt Koch, MD  Cardiologist:   Minus Breeding, MD   No chief complaint on file.     History of Present Illness: Dawn Ashley is a 80 y.o. female who presents for follow-up of atrial fibrillation. She was found to be in this incidentally last year when she was in the emergency room. She never felt the atrial fibrillation. She was treated with anticoagulation. She was in sinus rhythm when I saw her previously. She's not had any documented atrial fibrillation since that time. She's had spine surgery since I saw her. Unfortunately this hasn't really helped her back pain. She gets around slowly with a walker. She's not noticing any palpitations, presyncope or syncope. She's had no chest pressure, neck or arm discomfort. She's had no weight gain or edema. She's had no problems taking anticoagulation.   Past Medical History  Diagnosis Date  . Parkinson disease ([place])     takes Sinemet daily  . Back pain     left lumbar radiculopathy and scoliosis  . Heart murmur     h/o MVP, pt. reports murmur is almost resolved  . Arthritis     osteoporosis, osteoarthritis   . Swelling     legs / feet - takes Lasix to control  . Renal mass, right   . Frequency of urination   . MVP (mitral valve prolapse)     takes Amoxicillin before dental appointments  . Atrial fibrillation ([place])     takes Eliquis daily as well as Metoprolol  . Osteoporosis     takes Fosamax weekly  . Depression     takes Azilect daily  . GERD (gastroesophageal reflux disease)     takes Protonix daily  . History of vertigo     no meds  . Peripheral neuropathy ([facility])     takes Lyrica daily  . Bruises easily     from being on Eliquis  . History of kidney stones   . Nocturia   . Anemia     Past Surgical History  Procedure Laterality Date  . Cataract extraction w/ intraocular lens  implant, bilateral  Bilateral   . Dilation and curettage of uterus  2012  . Back surgery  2009/ 12/16    spurs resting on nerve, lumbar  . Total hip arthroplasty Right 09/20/2012    Procedure: TOTAL HIP ARTHROPLASTY;  Surgeon: Kerin Salen, MD;  Location: [facility];  Service: Orthopedics;  Laterality: Right;  . Orif pelvic fracture Right 09/20/2012    Procedure: OPEN REDUCTION INTERNAL FIXATION (ORIF) PELVIC FRACTURE;  Surgeon: Kerin Salen, MD;  Location: [facility];  Service: Orthopedics;  Laterality: Right;  . Bone spurs removed form spine 2007  2007  . Kidney surgery Right 05/2014    growth removed  . Tonsillectomy    . Colonoscopy    . Lumbar laminectomy/decompression microdiscectomy Left 06/11/2015    Procedure: LUMBAR LAMINECTOMY/DECOMPRESSION MICRODISCECTOMY LUMBAR THREE-FOUR;  Surgeon: Kristeen Miss, MD;  Location: [facility];  Service: Neurosurgery;  Laterality: Left;     Current Outpatient Prescriptions  Medication Sig Dispense Refill  . alendronate (FOSAMAX) 70 MG tablet TAKE 1 TABLET EVERY SATURDAY AT 6 P.M. TAKE WITH A FULL GLASS OF WATER ON AN EMPTY STOMACH 12 tablet 3  . amoxicillin (AMOXIL) 500 MG capsule Before dental appt    . apixaban (ELIQUIS) 5 MG TABS  tablet Take 1 tablet (5 mg total) by mouth 2 (two) times daily. 60 tablet 6  . Calcium Carbonate-Vitamin D (CALCIUM + D PO) Take 1 tablet by mouth 2 (two) times daily. 630mg     . carbidopa-levodopa (SINEMET IR) 25-100 MG tablet Take 1 tablet by mouth 4 (four) times daily. 360 tablet 3  . methocarbamol (ROBAXIN) 500 MG tablet Take 1 tablet (500 mg total) by mouth every 6 (six) hours as needed for muscle spasms. 40 tablet 3  . metoprolol succinate (TOPROL-XL) 100 MG 24 hr tablet TAKE 1 TABLET DAILY TAKE WITH OR IMMEDIATELY FOLLOWING A MEAL 90 tablet 2  . mirabegron ER (MYRBETRIQ) 50 MG TB24 tablet Take 1 tablet (50 mg total) by mouth daily. 30 tablet   . pantoprazole (PROTONIX) 40 MG tablet Take 1 tablet (40 mg total) by mouth daily. 90 tablet 3  .  polyvinyl alcohol (LIQUIFILM TEARS) 1.4 % ophthalmic solution Place 1 drop into both eyes daily as needed for dry eyes.    . pregabalin (LYRICA) 75 MG capsule Take 75 mg by mouth 2 (two) times daily.     . rasagiline (AZILECT) 1 MG TABS tablet Take 1 tablet (1 mg total) by mouth daily. 90 tablet 3  . sulfamethoxazole-trimethoprim (BACTRIM DS,SEPTRA DS) 800-160 MG tablet Take 1 tablet by mouth 2 (two) times daily. 10 tablet 0   No current facility-administered medications for this visit.    Allergies:   Gabapentin and Tramadol    ROS:  Please see the history of present illness.   Otherwise, review of systems are positive for numbness in the legs.   All other systems are reviewed and negative.    PHYSICAL EXAM: VS:  BP 138/58 mmHg  Pulse 80  Ht 5' 3.5" (1.613 m)  Wt 126 lb 1 oz (57.182 kg)  BMI 21.98 kg/m2 , BMI Body mass index is 21.98 kg/(m^2). GENERAL:  Frail appearing NECK:  No jugular venous distention, waveform within normal limits, carotid upstroke brisk and symmetric, no bruits, no thyromegaly LUNGS:  Clear to auscultation bilaterally CHEST:  Unremarkable HEART:  PMI not displaced or sustained,S1 and S2 within normal limits, no S3, no S4, no clicks, no rubs, no murmurs ABD:  Flat, positive bowel sounds normal in frequency in pitch, no bruits, no rebound, no guarding, no midline pulsatile mass, no hepatomegaly, no splenomegaly EXT:  2 plus pulses upper and decreased DP/PT, no edema, no cyanosis no clubbing    EKG:  EKG is not ordered today.    Recent Labs: 06/04/2015: BUN 21*; Creatinine, Ser 0.81; Hemoglobin 10.9*; Platelets 172; Potassium 3.8; Sodium 143    Lipid Panel     Wt Readings from Last 3 Encounters:  09/28/15 126 lb 1 oz (57.182 kg)  06/11/15 119 lb 4.8 oz (54.114 kg)  06/04/15 119 lb 4.8 oz (54.114 kg)      Other studies Reviewed: Additional studies/ records that were reviewed today include: Labs. Review of the above records demonstrates:  Please  see elsewhere in the note.     ASSESSMENT AND PLAN:  ATRIAL FIB:  This is asymptomatic and she is now in sinus by exam.  Ms. MERCADIES TRUXILLO has a CHA2DS2 - VASc score of 4 with a risk of stroke of 4%.   She is tolerating anticoagulation. She will continue the meds as listed.  HTN:  Her blood pressures is controlled. She will continue the meds as listed  Current medicines are reviewed at length with the patient today.  The  patient does not have concerns regarding medicines.  The following changes have been made:  See below  Labs/ tests ordered today include:     No orders of the defined types were placed in this encounter.     Disposition:   FU with me in 12 months.    Signed, Minus Breeding, MD  09/28/2015 11:35 AM    [place]

## 2015-09-28 ENCOUNTER — Ambulatory Visit (INDEPENDENT_AMBULATORY_CARE_PROVIDER_SITE_OTHER): Payer: Medicare Other | Admitting: Cardiology

## 2015-09-28 ENCOUNTER — Encounter: Payer: Self-pay | Admitting: Cardiology

## 2015-09-28 VITALS — BP 138/58 | HR 80 | Ht 63.5 in | Wt 126.1 lb

## 2015-09-28 DIAGNOSIS — I480 Paroxysmal atrial fibrillation: Secondary | ICD-10-CM | POA: Diagnosis not present

## 2015-09-28 NOTE — Patient Instructions (Signed)
Dr Percival Spanish recommends that you continue on your current medications as directed. Please refer to the Current Medication list given to you today.  Dr Percival Spanish recommends that you schedule a follow-up appointment in 1 year. You will receive a reminder letter in the mail two months in advance. If you don't receive a letter, please call our office to schedule the follow-up appointment.  If you need a refill on your cardiac medications before your next appointment, please call your pharmacy.

## 2015-10-01 ENCOUNTER — Telehealth: Payer: Self-pay | Admitting: Internal Medicine

## 2015-10-01 NOTE — Telephone Encounter (Signed)
Can double book at 10:30 on Wednesday.

## 2015-10-01 NOTE — Telephone Encounter (Signed)
i called and booked the appt as advised. The patient is in assisted living. Has no orders for any pain medication at this point other than the lyrica, which reads "upon request". It was thought that she was to be taking the lyrica twice per day.   Are you able to RX something in the meantime, even if it is only until Wednesday when she gets here?

## 2015-10-01 NOTE — Telephone Encounter (Signed)
i spoke to Dawn Ashley,  She clarified and said that the pain appears to be when she first wakes up. When she is doing things with her mind not it, the pain doesn't seem to be at that level. [name] is asking if we could set up a pain management type appointment with you. She wants to talk about the lyrica at that appointment.   - are you willing to fit the patient in at any point this week?  - if so, are you willing to send in something for the patient in the meantime?

## 2015-10-01 NOTE — Telephone Encounter (Signed)
Patients daughter called to schedule an appt. No access. She states that the patient is in "excrutiating" back pain. The patient claims that it hurts so bad that she is unable to get out of bed. Please advise.

## 2015-10-01 NOTE — Telephone Encounter (Signed)
Is she still taking the lyrica? That dose could be increased to help more with pain as long as she is not having any side effects from that. Would recommend acute visit.

## 2015-10-02 NOTE — Telephone Encounter (Signed)
Yesterday order was faxed for tylenol pending visit.

## 2015-10-03 ENCOUNTER — Encounter: Payer: Self-pay | Admitting: Internal Medicine

## 2015-10-03 ENCOUNTER — Ambulatory Visit (INDEPENDENT_AMBULATORY_CARE_PROVIDER_SITE_OTHER): Payer: Medicare Other | Admitting: Internal Medicine

## 2015-10-03 VITALS — BP 192/90 | HR 70 | Temp 97.7°F | Resp 14 | Ht 63.5 in | Wt 125.0 lb

## 2015-10-03 DIAGNOSIS — M4806 Spinal stenosis, lumbar region: Secondary | ICD-10-CM | POA: Diagnosis not present

## 2015-10-03 DIAGNOSIS — Z23 Encounter for immunization: Secondary | ICD-10-CM | POA: Diagnosis not present

## 2015-10-03 DIAGNOSIS — IMO0001 Reserved for inherently not codable concepts without codable children: Secondary | ICD-10-CM

## 2015-10-03 DIAGNOSIS — R030 Elevated blood-pressure reading, without diagnosis of hypertension: Secondary | ICD-10-CM | POA: Diagnosis not present

## 2015-10-03 DIAGNOSIS — S32010A Wedge compression fracture of first lumbar vertebra, initial encounter for closed fracture: Secondary | ICD-10-CM | POA: Diagnosis not present

## 2015-10-03 DIAGNOSIS — M48062 Spinal stenosis, lumbar region with neurogenic claudication: Secondary | ICD-10-CM

## 2015-10-03 MED ORDER — PREGABALIN 150 MG PO CAPS: 150.0000 mg | ORAL_CAPSULE | Freq: Two times a day (BID) | ORAL | Status: DC

## 2015-10-03 MED ORDER — LIDOCAINE 5 % EX PTCH: 1.0000 | MEDICATED_PATCH | CUTANEOUS | Status: AC

## 2015-10-03 NOTE — Progress Notes (Signed)
Pre visit review using our clinic review tool, if applicable. No additional management support is needed unless otherwise documented below in the visit note. 

## 2015-10-03 NOTE — Patient Instructions (Signed)
We have sent in a stronger lyrica prescription to see if it helps more.   We have also sent in a patch that is called lidoderm and helps with pain. Apply daily, it is okay to cut in 2 and place in 2 spots.   We will also get your referral for the pain doctor started in case we cannot help your pain.

## 2015-10-05 NOTE — Progress Notes (Signed)
   Subjective:    Patient ID: Dawn Ashley, female    DOB: [birth year], 80 y.o.   MRN: [id number]  HPI The patient is a 80 YO female coming in for back and hip pain. She notices it worst first thing in the morning or with walking. She is taking lyrica sometimes but cannot tell a difference in it. She has not done well with tramadol or hydrocodone in the past. Her back doctor recently gave her a patch to try for the pain called fentanyl but after the pharmacist's advice they did not get it filled. Has not fallen and uses walker with seat to ambulate. Tylenol does not help. Some OTC cream helps but only for a short time.   Review of Systems  Constitutional: Negative for fever, activity change, appetite change and fatigue.  Respiratory: Negative.   Cardiovascular: Negative.   Gastrointestinal: Negative.   Musculoskeletal: Positive for back pain, arthralgias and gait problem. Negative for myalgias.  Skin: Negative.   Neurological: Positive for weakness. Negative for dizziness, syncope and numbness.  Psychiatric/Behavioral: Negative.       Objective:   Physical Exam  Constitutional: She appears well-developed and well-nourished.  HENT:  Head: Normocephalic and atraumatic.  Eyes: EOM are normal.  Cardiovascular: Normal rate and regular rhythm.   Pulmonary/Chest: Effort normal and breath sounds normal. No respiratory distress. She has no wheezes.  Abdominal: Soft. She exhibits no distension. There is no tenderness. There is no rebound.  Musculoskeletal:  Tenderness in the lower back with radiation into the left leg and knee.   Neurological: She is alert. Coordination abnormal.  Uses walker for stability  Skin: Skin is warm and dry.   Filed Vitals:   10/03/15 1044  BP: 192/90  Pulse: 70  Temp: 97.7 F (36.5 C)  TempSrc: Oral  Resp: 14  Height: 5' 3.5" (1.613 m)  Weight: 125 lb (56.7 kg)  SpO2: 98%      Assessment & Plan:

## 2015-10-05 NOTE — Assessment & Plan Note (Signed)
Will increase lyrica since she is not having any drowsiness from it. Also rx for lidoderm patches to help with the previous compression fracture. Advised them that it was a wise decision to avoid the fentanyl patch as it is stronger than the hydrocodone which she was not able to tolerate. Okay to use tylenol if it helps still. Okay to continue with the OTC cream since it is helping some. Voltaren gel helped some but now she is not having as much relief with it.

## 2015-10-05 NOTE — Assessment & Plan Note (Signed)
BP monitored at her place and not high. On metoprolol.

## 2015-10-10 ENCOUNTER — Encounter: Payer: Self-pay | Admitting: Internal Medicine

## 2015-10-15 ENCOUNTER — Telehealth: Payer: Self-pay

## 2015-10-15 ENCOUNTER — Encounter: Payer: Self-pay | Admitting: Internal Medicine

## 2015-10-15 NOTE — Telephone Encounter (Signed)
PA initiated via CoverMyMeds [employer] [id number]

## 2015-10-16 NOTE — Telephone Encounter (Signed)
So, is the PA denied? Would recommend for them to buy at least 1-2 patches to try for efficacy. Can we request an exception?

## 2015-10-16 NOTE — Telephone Encounter (Signed)
See pt email 10/10/2015 and phone note 10/15/2015

## 2015-10-16 NOTE — Telephone Encounter (Signed)
PA for Lidoderm has been denied, please advise on alternative medication. Thanks!

## 2015-10-16 NOTE — Telephone Encounter (Signed)
PA initiated thru incorrect company.  New PA initiated thru [facility]

## 2015-10-16 NOTE — Telephone Encounter (Signed)
Can you call with updates or send mychart message to them.

## 2015-10-17 ENCOUNTER — Encounter: Payer: Self-pay | Admitting: Internal Medicine

## 2015-10-22 ENCOUNTER — Encounter: Payer: Self-pay | Admitting: Internal Medicine

## 2015-10-23 ENCOUNTER — Telehealth: Payer: Self-pay | Admitting: Radiology

## 2015-10-23 NOTE — Telephone Encounter (Signed)
Left msg on H # requesting patient call to schedule 18 mo follow up appointment with Dr. Kathlene Cote.  Karley Pho Riki Rusk, RN 10/23/2015 2:51 PM

## 2015-10-24 NOTE — Telephone Encounter (Signed)
Pt's son advised that PA was denied. He declined to pursue anything further at this time but will advised me/office of he changes his mind

## 2015-10-25 MED ORDER — PREGABALIN 75 MG PO CAPS: 75.0000 mg | ORAL_CAPSULE | Freq: Two times a day (BID) | ORAL | Status: DC

## 2015-10-25 NOTE — Telephone Encounter (Signed)
Please fax to facility. See prior mychart notes for the fax number.

## 2015-10-29 ENCOUNTER — Ambulatory Visit: Payer: Medicare Other | Admitting: Neurology

## 2015-10-29 ENCOUNTER — Telehealth: Payer: Self-pay

## 2015-10-29 NOTE — Telephone Encounter (Signed)
Patient did not show to appt today  

## 2015-10-30 ENCOUNTER — Encounter: Payer: Self-pay | Admitting: Radiology

## 2015-10-31 ENCOUNTER — Encounter: Payer: Self-pay | Admitting: Neurology

## 2015-11-13 LAB — BUN: BUN: 26 mg/dL — AB (ref 7–25)

## 2015-11-13 LAB — CREATININE WITH EST GFR
Creat: 0.88 mg/dL (ref 0.60–0.93)
GFR, EST NON AFRICAN AMERICAN: 64 mL/min (ref >=60)
GFR, Est African American: 73 mL/min (ref >=60)

## 2015-11-20 ENCOUNTER — Ambulatory Visit (INDEPENDENT_AMBULATORY_CARE_PROVIDER_SITE_OTHER): Payer: Medicare Other | Admitting: Neurology

## 2015-11-20 ENCOUNTER — Ambulatory Visit (HOSPITAL_COMMUNITY)
Admission: RE | Admit: 2015-11-20 | Discharge: 2015-11-20 | Disposition: A | Discharge status: Home / Self Care | Payer: Medicare Other | Source: Ambulatory Visit | Attending: Interventional Radiology | Admitting: Interventional Radiology

## 2015-11-20 ENCOUNTER — Encounter: Payer: Self-pay | Admitting: Neurology

## 2015-11-20 ENCOUNTER — Ambulatory Visit
Admission: RE | Admit: 2015-11-20 | Discharge: 2015-11-20 | Disposition: A | Discharge status: Home / Self Care | Payer: Medicare Other | Source: Ambulatory Visit | Attending: Interventional Radiology | Admitting: Interventional Radiology

## 2015-11-20 ENCOUNTER — Encounter (HOSPITAL_COMMUNITY): Payer: Self-pay

## 2015-11-20 VITALS — BP 152/78 | HR 78 | Resp 14 | Ht 63.5 in | Wt 126.0 lb

## 2015-11-20 DIAGNOSIS — M5442 Lumbago with sciatica, left side: Secondary | ICD-10-CM | POA: Diagnosis not present

## 2015-11-20 DIAGNOSIS — M79652 Pain in left thigh: Secondary | ICD-10-CM | POA: Diagnosis not present

## 2015-11-20 DIAGNOSIS — M7989 Other specified soft tissue disorders: Secondary | ICD-10-CM

## 2015-11-20 DIAGNOSIS — N200 Calculus of kidney: Secondary | ICD-10-CM | POA: Diagnosis not present

## 2015-11-20 DIAGNOSIS — N2889 Other specified disorders of kidney and ureter: Secondary | ICD-10-CM

## 2015-11-20 DIAGNOSIS — G20 Parkinson's disease: Secondary | ICD-10-CM | POA: Diagnosis not present

## 2015-11-20 DIAGNOSIS — M25562 Pain in left knee: Secondary | ICD-10-CM | POA: Diagnosis not present

## 2015-11-20 MED ORDER — IOPAMIDOL (ISOVUE-300) INJECTION 61%
100.0000 mL | Freq: Once | INTRAVENOUS | Status: AC | PRN
  Administered 2015-11-20: 100 mL via INTRAVENOUS

## 2015-11-20 NOTE — Progress Notes (Signed)
Subjective:    Patient ID: Dawn Ashley is a 80 y.o. female.  HPI     Interim history:   Dawn Ashley is a 80 year-old [place] lady, with an underlying complex medical history of arthritis, osteoporosis, leg swelling, renal mass, depression, reflux disease, history of vertigo, carotid artery disease, lumbar spine disease with multilevel degenerative joint disease and hypertrophic facet arthritis, s/p back surgeries (in 2008 under Dr. Patrice Paradise in [facility] and again more recently in November 2016 under Dr. Ellene Route), cataract surgery and R THR on 09/20/2012 (under Dr. Hal Morales), paroxysmal A. fib (followed by Dr. Luna Kitchens presents for followup consultation of her L sided predominant Parkinson's disease, which was diagnosed in 2006 with symptoms dating back to a few years prior. The patient is accompanied by her son, Dawn Ashley, again today. Of note, she had to miss an appointment on 10/29/15 due to feeling unwell. I last saw her on 07/19/2015, at which time she reported still being in pain. She had some setbacks since I last saw her before then. She had back surgery with Dr. Ellene Route on 06/11/2015 due to her history of spinal stenosis of the lumbar spine, scoliosis, and underwent laminectomy at L 3-4 on the left with decompression of L3 and L4 nerve roots. She was transferred to inpatient rehabilitation at [facility] and was there for about 3 weeks. She was at 1. able to walk with a walker and assistance with the therapist. However, she had a left knee pain as well. She lives at [facility] in independent living but started having hallucinations, , which became significantly worse and she also developed delusions, at which point she needed a sitter around-the-clock. She had discontinued the Robaxin and hydrocodone. She had more issues with constipation with and she was on pain medication. She was supposed to have another x-ray of her left hip and left knee. Her situation was overall complicated by mobility limitations  secondary to pain, pain medication causing hallucinations and delusions, perhaps not always hydrating well enough, and having had Parkinson's disease for at least 10 years. I suggested we increase Sinemet to 1 pill 4 times a day and keep the Azilect the same.  Today, 11/20/2015: She reports being in pain despite different meds tried and surgery. She has started seeing Dr. Balinda Quails with preferred management (recommended by PCP). She has started using Butrans patch and tizanidine 2 mg strength one pill twice daily as needed. Of note, she still is on Lyrica 75 mg twice daily. She does not always drink enough water. She has not had an actual fall but had a forced sit down event when she missed a chair. She did not hurt herself thankfully. Unfortunately she has struggled with pain and side effects from pain medications tried. Hydrocodone caused hallucinations. She was not tried on fentanyl but it was suggested at one point. She has been on Lyrica but could not tolerate higher doses. She has been on Lidoderm patch, and Voltaren gel helps very minimally. I had a email conversation with her son regarding these issues and I suggested consultation with a pain management doctor. She has in the interim seen her cardiologist on 09/28/2015 and I reviewed that note. She has a history of paroxysmal A. fib and is on anticoagulation for that. She has occasional double vision which is brief. This is binocular. Eye checkup recently was fine. She has had bilateral cataract repairs.  Previously:  Of note, she had canceled her appointment for 07/16/2015 and her son then requested  a sooner appointment. I last saw her on 03/22/2015, at which time she reported ongoing issues with LBP and L thigh pain. She sees Dr. Ellene Route for this. She had injections into her lower back and knee, which she felt were not helpful. She was no longer and mobile and had taken oral steroids as well. She was on Lyrica. She was diagnosed with A. fib in  July 2016. She was actually at her urologist's office when she was noted to have an irregular pulse. She was sent to the emergency room on 02/09/15 and she was found to be in new onset A. fib with RVR. She was started on Eliquis and saw Dr. Percival Spanish. Parkinson's-wise, she felt quite stable. She had no recent falls. She did have a controlled forced sit down event as she missed a chair one time. She was using her walker at all times. I suggested she continue with Sinemet 3 times a day and Azilect once daily. She was off of Mirapex.  I saw her on 11/07/2014, at which time she reported being in physical therapy at [facility]. She had seen her spine doctor. She was on an oral prednisone taper. She had noticed more reflux symptoms. She was taking Mobic. She had a left knee sterilely injection which she felt was not very helpful. She reported bilateral low back pain without radiation. She felt that the increase in Sinemet to 1 pill 3 times a day was somewhat helpful but not dramatically. She felt that her bladder hyperactivity was controlled on Myrbetriq. She had lost some weight. She had started drinking some boost for supplement. Her son worried about her memory loss. When she had her hip replacement surgery her son had taken over her finances and she found it hard to go back into it. I suggested we continue with the same medications. She was reminded to use her walker at all times.  I saw her on 07/07/2014, at which time she was doing well, and she denied any side effects from Sinemet. She was doing physical therapy at [facility]. Mood and memory were stable. She had no recent falls. She was supposed to spend her holidays with her 2 sons, one in [place] and the other in [place], both with 2 children each. She was using her walker or cane. She had lower back injection with a follow-up scheduled. She was on a bladder medication. I asked her to discontinue Mirapex altogether due to lower extremity swelling.  I increased her Sinemet to one full pill 3 times a day.  In the interim, she presented to her primary care physician on 10/26/2014 with left knee pain. This was deemed secondary to osteoarthritis. I reviewed the office records from Dr. Doug Sou. She had steroid injection into her left knee.  Prior to that she presented to urgent care on 10/21/2014 with similar complaints of left knee pain and also low back pain. She had a lumbar spine x-ray on 10/21/2014 which I reviewed as well as pelvis x-ray in 2 views on 10/21/2014 which also reviewed: No evidence of acute abnormality. L1 and L4 compression fractures again noted as well as severe thoracolumbar scoliosis and multilevel degenerative changes. No fracture or acute finding. Right hip prosthesis is well aligned no evidence of significant loosening.    I saw her on 03/20/2014, at which time she reported no significant exacerbation of symptoms. She had stopped gabapentin as she felt too sleepy with it. She was in the home health physical therapy at the time. At  the time of her last visit I asked her to reduce Mirapex because of lower extremity swelling noted. I introduced a small dose of Sinemet starting with half a pill twice daily with increase to half a pill 3 times a day.   I saw her on 09/19/2013, at which time she reported having reduced her gabapentin. She was started on Gabapentin in 11/13, by her sports medicine doctor and was up to 300 mg tid, but has had sleepiness and blurry vision with it. She was scheduled for a back injection on 10/06/13 under Dr. Mina Marble. Dr. Mayer Camel is her orthopedic doctor. She had started PT at [facility], 3 times a week. She felt stable with her PD Sx, and has never been on C/L. I kept her on Mirapex and Azilect. In March she called back reporting swelling and redness in her toes. I suggested she have it checked out by her primary care physician as it may indicate several issues such as with circulation, venous stasis, or  cellulitis. She has been seeing her primary care physician for lower extremity swelling.  I saw her on 03/08/2013, at which time I felt she was quite stable and that she had recovered well from her recent total hip replacement surgery. She was complaining of daytime somnolence and I suggested changing her Mirapex immediate release to long-acting. However, she called back a few days later reporting that the long-acting Mirapex was not covered by her insurance and we changed her back to immediate release Mirapex.    I first met her on 12/06/12 at which time I did not change her medications. She previously followed with Dr. Morene Antu and was last seen by him on 09/08/2012, which time he felt that her sleepiness was in part d/t tramadol and stopped it. He also obtained blood work including CBC, CMP, TSH and urinalysis as well as B12 level and ordered a brain MRI. She had problems with her gait which he primarily attributed to right hip pain.   Her blood work showed mild elevated alkaline phosphatase and BUN of 28 and creatinine of 1.17. Her son was called back and advised that she should increase her fluid intake. Her urinalysis was not consistent with clear-cut UTI. Since her last visit with Dr. Erling Cruz she had right hip replacement surgery on 09/20/12. She has been in rehabilitation. She did not end up getting her brain scan because she had surgery soon after her last visit and was in rehabilitation since then. Her sleepiness and confusion improved after her pain medication was changed from tramadol to diclofenac. She moved to [place]. She had developed some LE swelling and was switched from HCTZ to Lasix and advised to reduce her NSAIDs.     Her Past Medical History Is Significant For: Past Medical History  Diagnosis Date  . Parkinson disease ([place])     takes Sinemet daily  . Back pain     left lumbar radiculopathy and scoliosis  . Heart murmur     h/o MVP, pt. reports murmur is  almost resolved  . Arthritis     osteoporosis, osteoarthritis   . Swelling     legs / feet - takes Lasix to control  . Renal mass, right   . Frequency of urination   . MVP (mitral valve prolapse)     takes Amoxicillin before dental appointments  . Atrial fibrillation ([place])     takes Eliquis daily as well as Metoprolol  . Osteoporosis  takes Fosamax weekly  . Depression     takes Azilect daily  . GERD (gastroesophageal reflux disease)     takes Protonix daily  . History of vertigo     no meds  . Peripheral neuropathy ([facility])     takes Lyrica daily  . Bruises easily     from being on Eliquis  . History of kidney stones   . Nocturia   . Anemia     Her Past Surgical History Is Significant For: Past Surgical History  Procedure Laterality Date  . Cataract extraction w/ intraocular lens  implant, bilateral Bilateral   . Dilation and curettage of uterus  2012  . Back surgery  2009/ 12/16    spurs resting on nerve, lumbar  . Total hip arthroplasty Right 09/20/2012    Procedure: TOTAL HIP ARTHROPLASTY;  Surgeon: Kerin Salen, MD;  Location: [place];  Service: Orthopedics;  Laterality: Right;  . Orif pelvic fracture Right 09/20/2012    Procedure: OPEN REDUCTION INTERNAL FIXATION (ORIF) PELVIC FRACTURE;  Surgeon: Kerin Salen, MD;  Location: [place];  Service: Orthopedics;  Laterality: Right;  . Bone spurs removed form spine 2007  2007  . Kidney surgery Right 05/2014    growth removed  . Tonsillectomy    . Colonoscopy    . Lumbar laminectomy/decompression microdiscectomy Left 06/11/2015    Procedure: LUMBAR LAMINECTOMY/DECOMPRESSION MICRODISCECTOMY LUMBAR THREE-FOUR;  Surgeon: Kristeen Miss, MD;  Location: [facility];  Service: Neurosurgery;  Laterality: Left;    Her Family History Is Significant For: Family History  Problem Relation Age of Onset  . Breast cancer Maternal Grandmother   . Arthritis Maternal Grandmother   . Hypertension Other   . Osteoporosis Other   . Breast  cancer Mother     had mastectomy in 1982  . Heart disease Mother   . Stroke Mother     mild, 2001  . Hypertension Mother   . Osteoporosis Mother     also had spine surgery 1987, & hip repalcement  . Arthritis Sister   . Hypertension Sister   . Parkinsonism Maternal Uncle   . Arthritis Maternal Grandfather     Her Social History Is Significant For: Social History   Social History  . Marital Status: Widowed    Spouse Name: N/A  . Number of Children: 2  . Years of Education: 12   Occupational History  .      retired   Social History Main Topics  . Smoking status: Never Smoker   . Smokeless tobacco: Never Used  . Alcohol Use: No  . Drug Use: No  . Sexual Activity: No   Other Topics Concern  . None   Social History Narrative   Patient is right handed, resides alone    Her Allergies Are:  Allergies  Allergen Reactions  . Gabapentin Other (See Comments)    sleepy  . Tramadol Other (See Comments)    Confusion and sleepy  :   Her Current Medications Are:  Outpatient Encounter Prescriptions as of 11/20/2015  Medication Sig  . alendronate (FOSAMAX) 70 MG tablet TAKE 1 TABLET EVERY SATURDAY AT 6 P.M. TAKE WITH A FULL GLASS OF WATER ON AN EMPTY STOMACH  . amoxicillin (AMOXIL) 500 MG capsule Before dental appt  . apixaban (ELIQUIS) 5 MG TABS tablet Take 1 tablet (5 mg total) by mouth 2 (two) times daily.  Marland Kitchen BUTRANS 5 MCG/HR PTWK patch   . Calcium Carbonate-Vitamin D (CALCIUM + D PO) Take 1  tablet by mouth 2 (two) times daily. '630mg'$   . carbidopa-levodopa (SINEMET IR) 25-100 MG tablet Take 1 tablet by mouth 4 (four) times daily.  Marland Kitchen lidocaine (LIDODERM) 5 % Place 1 patch onto the skin daily. Remove & Discard patch within 12 hours or as directed by MD  . LYRICA 150 MG capsule   . metoprolol succinate (TOPROL-XL) 100 MG 24 hr tablet TAKE 1 TABLET DAILY TAKE WITH OR IMMEDIATELY FOLLOWING A MEAL  . mirabegron ER (MYRBETRIQ) 50 MG TB24 tablet Take 1 tablet (50 mg total) by  mouth daily.  . pantoprazole (PROTONIX) 40 MG tablet Take 1 tablet (40 mg total) by mouth daily.  . polyvinyl alcohol (LIQUIFILM TEARS) 1.4 % ophthalmic solution Place 1 drop into both eyes daily as needed for dry eyes.  . rasagiline (AZILECT) 1 MG TABS tablet Take 1 tablet (1 mg total) by mouth daily.  Marland Kitchen tiZANidine (ZANAFLEX) 2 MG tablet   . [DISCONTINUED] pregabalin (LYRICA) 75 MG capsule Take 1 capsule (75 mg total) by mouth 2 (two) times daily.   No facility-administered encounter medications on file as of 11/20/2015.  :  Review of Systems:  Out of a complete 14 point review of systems, all are reviewed and negative with the exception of these symptoms as listed below:        Review of Systems  Neurological:       Patient reports that she is still having lower left back pain that radiates into L knee.  Patient has been to a pain clinic. Started 2 medications: Butrans patch and tizanidine, patient and son would like to know Dr. Guadelupe Sabin opinion of this.     Objective:  Neurologic Exam  Physical Exam Physical Examination:   Filed Vitals:   11/20/15 0838  BP: 152/78  Pulse: 78  Resp: 14   General Examination: The patient is a very pleasant 80 y.o. female in no acute distress.she is situated in her wheelchair. She is leaning to the left, not new, but perhaps a little worse. She does not have any active hallucinations.  HEENT: Normocephalic, atraumatic, pupils are equal, round and reactive to light and accommodation. Extraocular tracking shows mild saccadic breakdown without nystagmus noted.she has no double vision currently. She is status post bilateral cataract extractions. There is no limitation to her gaze. There is moderate decrease in eye blink rate. Hearing is intact. Face is symmetric with mild facial masking and normal facial sensation. There is no lip, neck or jaw tremor. Neck is mildly rigid with intact passive ROM. There are no carotid bruits on auscultation. Oropharynx  exam reveals moderate to severe mouth dryness. No significant airway crowding is noted. Mallampati is class II. Tongue protrudes centrally and palate elevates symmetrically. There is no drooling.   Chest: is clear to auscultation without wheezing, rhonchi or crackles noted.  Heart: sounds are regular and normal without murmurs, rubs or gallops noted.   Abdomen: is soft, non-tender and non-distended with normal bowel sounds appreciated on auscultation.  Extremities: There is 2 to 3+ pitting edema in both lower extremities. This is significantly worse on the right. This has overall become worse than last time.    Skin: is warm and dry with no trophic changes noted.  Musculoskeletal: exam reveals: she reports low back pain. She has evidence of scoliosis and kyphosis. She has left knee pain.   Neurologically:  Mental status: The patient is awake and alert, paying good attention. She is able to provide some of her history but details  are provided by her son. She is oriented to: person, place, situation, day of week, month of year and year. Her memory, attention, language and knowledge are fairly well preserved but she has trouble relating some of her historical details. There is no aphasia, agnosia, apraxia or anomia. There is a no bradyphrenia. Speech is moderately hypophonic with no dysarthria noted. Mood is congruent and affect is normal.   Cranial nerves are as described above under HEENT exam. In addition, shoulder shrug is normal with equal shoulder height noted.   Motor exam: Normal bulk, and strength for age is noted. There are no dyskinesias noted. Tone is mildly rigid with absence of cogwheeling in the bilateral extremities. There is overall moderate bradykinesia. There is no drift or rebound. There is a very mild intermittent left upper extremity resting tremor, unchanged. She has no other tremors. She has her 4 wheeled walker with seat today. Reflexes are 1+ in the upper extremities and 1+  in the lower extremities. Fine motor skills exam reveals: mild to moderate impairment bilaterally, left side more moderately impaired, right-sided little better, overall stable, but bradykinesia has become worse.   Cerebellar testing shows no dysmetria or intention tremor on finger to nose testing. Heel to shin is not possible on the L.   Sensory exam is intact to light touch in the UEs and LEs.    Gait, station and balance exam: She stands up with moderate difficulty and requires mild assistance. She does push herself up. She has a moderately stooped posture with kyphoscoliosis and leans to the L. After a few moments of standing, she starts feeling a pain that affects her left anterior thigh and knee area. She took a couple of short steps but felt that the pain would not let her walk anymore. I pulled the wheelchair back behind her.  Assessment and Plan:   In summary, ZOOEY SCHREURS is a very pleasant 80 year old female with a history of left-sided predominant Parkinson's disease diagnosed in 2006 but with symptoms dating back to a few years prior to that. She has had a fairly slow course over the years. She has in the interim been diagnosed with new onset A. Fib and was placed on Eliquis. She has in the interim had low back surgery on 06/11/2015. She has more recently started seeing a pain management doctor and has started [employer] patch and ties amidine. Parkinson's-wise she is fairly stable with subtle worsening of her facial masking, and worsening of her slowness and perhaps her posture with leaning more to the left. She still has residual pain and is working with her new doctor on this. She has appointments scheduled soon for perhaps another procedure and more testing. She is advised to continue with her Sinemet 4 times a day and Azilect once daily. I did not see any reason to change this today and plus, we want to keep her other medications stable as she is still working with her pain management  doctor actively. Her swelling has clearly become worse, right side more pronounced than left and I wonder if it is the Lyrica causing this. I don't see any other medications that could cause this. She does not have any other symptoms. She does have compression stockings which she did not use today. She is advised to discuss with her primary care physician the possibility of reducing or even coming off of Lyrica eventually. Her situation is of course challenging because of her Parkinson's disease, having had surgery, abnormal posture, medication  intolerances, and overall advancing age. I would like to see her back in 4 months, sooner if needed. I have asked her son to keep me posted via email or phone calls as well. I answered all their questions today and they were in agreement. I spent 25 minutes in total face-to-face time with the patient, more than 50% of which was spent in counseling and coordination of care, reviewing test results, reviewing medication and discussing or reviewing the diagnosis of PD, arthritis pain, the prognosis and treatment options.

## 2015-11-20 NOTE — Progress Notes (Signed)
Chief Complaint: Status post cryoablation of a right renal neoplasm on 04/21/2014.   History of Present Illness: Dawn Ashley is a 80 y.o. female status post CT-guided cryoablation of a right renal neoplasm on 04/21/2014. Since prior follow-up on 02/15/2015, the patient has undergone lumbar decompression surgery at L3-4 by Dr. Ellene Route. The procedure was performed on 06/11/2015. Mrs. Briel states that the procedure was only marginally of benefit for symptoms of chronic low back pain and left lower extremity radiculopathy. Suspicion was raised of a nodular area of abnormal enhancement along the margin of prior right renal ablation on a CT dated 08/23/2014. This appeared relatively stable on 02/15/2015 and was chosen to be followed. She remains asymptomatic with respect to the right kidney and urinary tract.   Past Medical History  Diagnosis Date  . Parkinson disease ([place])     takes Sinemet daily  . Back pain     left lumbar radiculopathy and scoliosis  . Heart murmur     h/o MVP, pt. reports murmur is almost resolved  . Arthritis     osteoporosis, osteoarthritis   . Swelling     legs / feet - takes Lasix to control  . Renal mass, right   . Frequency of urination   . MVP (mitral valve prolapse)     takes Amoxicillin before dental appointments  . Atrial fibrillation ([place])     takes Eliquis daily as well as Metoprolol  . Osteoporosis     takes Fosamax weekly  . Depression     takes Azilect daily  . GERD (gastroesophageal reflux disease)     takes Protonix daily  . History of vertigo     no meds  . Peripheral neuropathy ([facility])     takes Lyrica daily  . Bruises easily     from being on Eliquis  . History of kidney stones   . Nocturia   . Anemia     Past Surgical History  Procedure Laterality Date  . Cataract extraction w/ intraocular lens  implant, bilateral Bilateral   . Dilation and curettage of uterus  2012  . Back surgery  2009/ 12/16    spurs resting on nerve, lumbar    . Total hip arthroplasty Right 09/20/2012    Procedure: TOTAL HIP ARTHROPLASTY;  Surgeon: Kerin Salen, MD;  Location: [facility];  Service: Orthopedics;  Laterality: Right;  . Orif pelvic fracture Right 09/20/2012    Procedure: OPEN REDUCTION INTERNAL FIXATION (ORIF) PELVIC FRACTURE;  Surgeon: Kerin Salen, MD;  Location: [facility];  Service: Orthopedics;  Laterality: Right;  . Bone spurs removed form spine 2007  2007  . Kidney surgery Right 05/2014    growth removed  . Tonsillectomy    . Colonoscopy    . Lumbar laminectomy/decompression microdiscectomy Left 06/11/2015    Procedure: LUMBAR LAMINECTOMY/DECOMPRESSION MICRODISCECTOMY LUMBAR THREE-FOUR;  Surgeon: Kristeen Miss, MD;  Location: [facility];  Service: Neurosurgery;  Laterality: Left;    Allergies: Gabapentin and Tramadol  Medications: Prior to Admission medications   Medication Sig Start Date End Date Taking? Authorizing Provider  alendronate (FOSAMAX) 70 MG tablet TAKE 1 TABLET EVERY SATURDAY AT 6 P.M. TAKE WITH A FULL GLASS OF WATER ON AN EMPTY STOMACH 11/20/14   Hoyt Koch, MD  amoxicillin (AMOXIL) 500 MG capsule Before dental appt 12/12/14   Historical Provider, MD  apixaban (ELIQUIS) 5 MG TABS tablet Take 1 tablet (5 mg total) by mouth 2 (two) times daily. 02/12/15  Minus Breeding, MD  BUTRANS 5 MCG/HR [place] patch  11/06/15   Historical Provider, MD  Calcium Carbonate-Vitamin D (CALCIUM + D PO) Take 1 tablet by mouth 2 (two) times daily. 644m    Historical Provider, MD  carbidopa-levodopa (SINEMET IR) 25-100 MG tablet Take 1 tablet by mouth 4 (four) times daily. 07/19/15   SStar Age MD  lidocaine (LIDODERM) 5 % Place 1 patch onto the skin daily. Remove & Discard patch within 12 hours or as directed by MD 10/03/15   EHoyt Koch MD  LYRICA 150 MG capsule  10/03/15   Historical Provider, MD  metoprolol succinate (TOPROL-XL) 100 MG 24 hr tablet TAKE 1 TABLET DAILY TAKE WITH OR IMMEDIATELY FOLLOWING A MEAL 07/26/15    JMinus Breeding MD  mirabegron ER (MYRBETRIQ) 50 MG TB24 tablet Take 1 tablet (50 mg total) by mouth daily. 08/09/15   EHoyt Koch MD  pantoprazole (PROTONIX) 40 MG tablet Take 1 tablet (40 mg total) by mouth daily. 08/08/15   EHoyt Koch MD  polyvinyl alcohol (LIQUIFILM TEARS) 1.4 % ophthalmic solution Place 1 drop into both eyes daily as needed for dry eyes.    Historical Provider, MD  rasagiline (AZILECT) 1 MG TABS tablet Take 1 tablet (1 mg total) by mouth daily. 02/20/15   SStar Age MD  tiZANidine (ZANAFLEX) 2 MG tablet  11/06/15   Historical Provider, MD     Family History  Problem Relation Age of Onset  . Breast cancer Maternal Grandmother   . Arthritis Maternal Grandmother   . Hypertension Other   . Osteoporosis Other   . Breast cancer Mother     had mastectomy in 1982  . Heart disease Mother   . Stroke Mother     mild, 2001  . Hypertension Mother   . Osteoporosis Mother     also had spine surgery 1987, & hip repalcement  . Arthritis Sister   . Hypertension Sister   . Parkinsonism Maternal Uncle   . Arthritis Maternal Grandfather     Social History   Social History  . Marital Status: Widowed    Spouse Name: N/A  . Number of Children: 2  . Years of Education: 12   Occupational History  .      retired   Social History Main Topics  . Smoking status: Never Smoker   . Smokeless tobacco: Never Used  . Alcohol Use: No  . Drug Use: No  . Sexual Activity: No   Other Topics Concern  . Not on file   Social History Narrative   Patient is right handed, resides alone    Review of Systems: A 12 point ROS discussed and pertinent positives are indicated in the HPI above.  All other systems are negative.  Review of Systems  Constitutional: Negative.   Respiratory: Negative.   Cardiovascular: Negative.   Gastrointestinal: Negative.   Genitourinary: Negative.   Musculoskeletal: Positive for back pain and gait problem.  Neurological: Negative.      Vital Signs: BP 131/58 mmHg  Pulse 65  Temp(Src) 97.8 F (36.6 C) (Oral)  Resp 14  SpO2 98%  Physical Exam  Constitutional: She is oriented to person, place, and time. No distress.  Abdominal: Soft. She exhibits no distension. There is no tenderness. There is no rebound and no guarding.  Musculoskeletal: She exhibits no edema.  Neurological: She is alert and oriented to person, place, and time.  Skin: She is not diaphoretic.  Nursing note and vitals reviewed.  Imaging: Ct Abd Wo & W Cm  11/20/2015  CLINICAL DATA:  Right renal mass, status post cryoablation EXAM: CT ABDOMEN WITHOUT AND WITH CONTRAST TECHNIQUE: Multidetector CT imaging of the abdomen was performed following the standard protocol before and following the bolus administration of intravenous contrast. CONTRAST:  171m ISOVUE-300 IOPAMIDOL (ISOVUE-300) INJECTION 61% COMPARISON:  02/15/2015 FINDINGS: Lower chest: Lung bases are essentially clear. Right posterior Bochdalek's hernia. Hepatobiliary: Liver is within normal limits. No suspicious/enhancing hepatic lesions. Layering tiny gallstone (series 2/ image 40). No associated inflammatory changes. No intrahepatic or extrahepatic ductal dilatation. Pancreas: Within normal limits. Spleen: Within normal limits. Adrenals/Urinary Tract: Adrenal glands are within normal limits. 1.3 x 1.5 x 1.5 cm enhancing lesion along the lateral right upper kidney (series 4/ image 33), previously 0.9 x 1.2 x 1.2 cm when measured in a similar fashion, worrisome for residual right renal neoplasm such as renal cell carcinoma. Four nonobstructing left renal calculi measuring up to 9 mm in the interpolar region (series 2/ image 38). No hydronephrosis. Stomach/Bowel: Stomach is notable for a moderate hiatal hernia. Visualized bowel is grossly unremarkable. Vascular/Lymphatic: No evidence abdominal aortic aneurysm. Atherosclerotic calcifications of the abdominal aorta and branch vessels. Single right renal  artery and vein.  No renal vein invasion. No suspicious abdominal lymphadenopathy. Other: No abdominal ascites. Musculoskeletal: Degenerative changes of the visualized thoracolumbar spine with upper lumbar dextroscoliosis. Superior endplate compression fracture deformities at L1 and L4. IMPRESSION: 1.5 cm enhancing lesion along the lateral right upper kidney, previously 1.2 cm, worrisome for residual right renal neoplasm such as renal cell carcinoma. Single right renal artery and vein.  No renal vein invasion. Four nonobstructing left renal calculi measuring up to 9 mm. No hydronephrosis. Electronically Signed   By: SJulian HyM.D.   On: 11/20/2015 14:36    Labs:  CBC:  Recent Labs  02/09/15 1105 02/26/15 0853 06/04/15 1041  WBC 5.7 5.8 5.5  HGB 10.0* 10.8* 10.9*  HCT 32.8* 35.1* 34.6*  PLT 144* 206 172    BMP:  Recent Labs  01/26/15 0853 02/09/15 1105 06/04/15 1041 11/12/15 0858  NA  --  142 143  --   K  --  3.9 3.8  --   CL  --  110 108  --   CO2  --  26 26  --   GLUCOSE  --  104* 97  --   BUN 29* 31* 21* 26*  CALCIUM  --  9.3 9.6  --   CREATININE 0.84 0.86 0.81 0.88  GFRNONAA 67 >60 >60 64  GFRAA 78 >60 >60 73     Assessment and Plan:  I met with Mrs. [place] her son. We reviewed all of the post ablation CT studies including a follow-up performed today. This was read as being suspicious for nodular tumor recurrence with slight enlargement of a previously identified nodular area of enhancement of the lateral right renal cortex. When comparing imaging studies, this only appears minimally larger compared to the January, 2016 study. Maximal diameter of nodular tissue currently is approximately 1.6 cm compared to approximately 1.5 cm on the January study when making comparable measurements. Renal function remains normal and stable.  Treatment options for the area of nodular enhancement were reviewed including continued surveillance or repeat percutaneous ablation,  potentially with thermal ablation rather than cryoablation. This region has changed very little since January, 2016 and I told Mrs. [place] her son that we could continue to follow this region and consider re-ablation at  a later date, especially since she is still dealing with significant back pain which affects her ability to ambulate. When offered the option of a six-month follow-up CT scan, the patient and her son would like to pursue that rather than immediately schedule another ablation procedure.  We will schedule a follow-up CT study in mid October and I will review findings at that time with Mrs. Kirkwood and her son.  Electronically SignedAletta Edouard T 11/20/2015, 5:23 PM   I spent a total of  15 Minutes in face to face in clinical consultation, greater than 50% of which was counseling/coordinating care post cryoablation of a right renal neoplasm.

## 2015-11-20 NOTE — Patient Instructions (Signed)
Please call PCP's office to notify her about the worse leg swelling. Lyrica can cause swelling, and this has become worse on my exam.  Use your compression stockings. Elevate legs when possible. Keep using your walker, turn more slowly.  We will keep your parkinson's medications the same for now. I will recheck with you in 4 months, sooner if needed.

## 2015-11-24 ENCOUNTER — Emergency Department (HOSPITAL_COMMUNITY): Payer: Medicare Other

## 2015-11-24 ENCOUNTER — Emergency Department (HOSPITAL_COMMUNITY)
Admission: EM | Admit: 2015-11-24 | Discharge: 2015-11-25 | Disposition: A | Discharge status: Home / Self Care | Payer: Medicare Other | Attending: Emergency Medicine | Admitting: Emergency Medicine

## 2015-11-24 ENCOUNTER — Encounter (HOSPITAL_COMMUNITY): Payer: Self-pay

## 2015-11-24 DIAGNOSIS — Z79899 Other long term (current) drug therapy: Secondary | ICD-10-CM | POA: Insufficient documentation

## 2015-11-24 DIAGNOSIS — Z7901 Long term (current) use of anticoagulants: Secondary | ICD-10-CM | POA: Insufficient documentation

## 2015-11-24 DIAGNOSIS — K219 Gastro-esophageal reflux disease without esophagitis: Secondary | ICD-10-CM | POA: Diagnosis not present

## 2015-11-24 DIAGNOSIS — G629 Polyneuropathy, unspecified: Secondary | ICD-10-CM | POA: Diagnosis not present

## 2015-11-24 DIAGNOSIS — I4891 Unspecified atrial fibrillation: Secondary | ICD-10-CM | POA: Insufficient documentation

## 2015-11-24 DIAGNOSIS — W01198A Fall on same level from slipping, tripping and stumbling with subsequent striking against other object, initial encounter: Secondary | ICD-10-CM | POA: Insufficient documentation

## 2015-11-24 DIAGNOSIS — M810 Age-related osteoporosis without current pathological fracture: Secondary | ICD-10-CM | POA: Diagnosis not present

## 2015-11-24 DIAGNOSIS — Y9301 Activity, walking, marching and hiking: Secondary | ICD-10-CM | POA: Insufficient documentation

## 2015-11-24 DIAGNOSIS — M1990 Unspecified osteoarthritis, unspecified site: Secondary | ICD-10-CM | POA: Diagnosis not present

## 2015-11-24 DIAGNOSIS — Y92129 Unspecified place in nursing home as the place of occurrence of the external cause: Secondary | ICD-10-CM

## 2015-11-24 DIAGNOSIS — Y999 Unspecified external cause status: Secondary | ICD-10-CM | POA: Diagnosis not present

## 2015-11-24 DIAGNOSIS — Z96641 Presence of right artificial hip joint: Secondary | ICD-10-CM | POA: Insufficient documentation

## 2015-11-24 DIAGNOSIS — D68318 Other hemorrhagic disorder due to intrinsic circulating anticoagulants, antibodies, or inhibitors: Secondary | ICD-10-CM | POA: Insufficient documentation

## 2015-11-24 DIAGNOSIS — F329 Major depressive disorder, single episode, unspecified: Secondary | ICD-10-CM | POA: Diagnosis not present

## 2015-11-24 DIAGNOSIS — Z23 Encounter for immunization: Secondary | ICD-10-CM | POA: Insufficient documentation

## 2015-11-24 DIAGNOSIS — S0101XA Laceration without foreign body of scalp, initial encounter: Secondary | ICD-10-CM | POA: Diagnosis not present

## 2015-11-24 DIAGNOSIS — Z792 Long term (current) use of antibiotics: Secondary | ICD-10-CM | POA: Diagnosis not present

## 2015-11-24 DIAGNOSIS — S0191XA Laceration without foreign body of unspecified part of head, initial encounter: Secondary | ICD-10-CM | POA: Diagnosis present

## 2015-11-24 DIAGNOSIS — W19XXXA Unspecified fall, initial encounter: Secondary | ICD-10-CM

## 2015-11-24 DIAGNOSIS — G20 Parkinson's disease: Secondary | ICD-10-CM | POA: Diagnosis not present

## 2015-11-24 DIAGNOSIS — Y9289 Other specified places as the place of occurrence of the external cause: Secondary | ICD-10-CM | POA: Diagnosis not present

## 2015-11-24 MED ORDER — TETANUS-DIPHTH-ACELL PERTUSSIS 5-2.5-18.5 LF-MCG/0.5 IM SUSP
0.5000 mL | Freq: Once | INTRAMUSCULAR | Status: AC
  Administered 2015-11-24: 0.5 mL via INTRAMUSCULAR
  Filled 2015-11-24: qty 0.5

## 2015-11-24 NOTE — Discharge Instructions (Signed)

## 2015-11-24 NOTE — ED Provider Notes (Signed)
CSN: [id number]     Arrival date & time 11/24/15  1750 History   First MD Initiated Contact with Patient 11/24/15 1815     Chief Complaint  Patient presents with  . Fall  . Head Laceration     (Consider location/radiation/quality/duration/timing/severity/associated sxs/prior Treatment) The history is provided by the patient, medical records and the EMS personnel. No language interpreter was used.     Dawn Ashley is a 80 y.o. female  with a hx of Parkinson disease, chronic low back pain, bilateral peripheral edema, A. fib, GERD, kidney stones presents to the Emergency Department complaining of acute laceration to the left side of the head after fall. Patient lives at [facility] assisted living. She reports she was walking in her with her walker when she lost her balance and fell. She denies loss of consciousness or syncope. Patient reports she did not attempt to get off of the floor so that the staff would find her in the position that she fell. She was found to 30 minutes after falling.    Record review shows that patient is taking Eliquis, however review of her MAR from [facility] does not list this as a current medication. Patient does not know if she is taking a blood thinner.  Pt's son has arrived at bedside who reports that pt was restarted on her Eliquis last week and has been compliant with the medication.     Past Medical History  Diagnosis Date  . Parkinson disease ([place])     takes Sinemet daily  . Back pain     left lumbar radiculopathy and scoliosis  . Heart murmur     h/o MVP, pt. reports murmur is almost resolved  . Arthritis     osteoporosis, osteoarthritis   . Swelling     legs / feet - takes Lasix to control  . Renal mass, right   . Frequency of urination   . MVP (mitral valve prolapse)     takes Amoxicillin before dental appointments  . Atrial fibrillation ([place])     takes Eliquis daily as well as Metoprolol  . Osteoporosis     takes Fosamax weekly   . Depression     takes Azilect daily  . GERD (gastroesophageal reflux disease)     takes Protonix daily  . History of vertigo     no meds  . Peripheral neuropathy ([facility])     takes Lyrica daily  . Bruises easily     from being on Eliquis  . History of kidney stones   . Nocturia   . Anemia    Past Surgical History  Procedure Laterality Date  . Cataract extraction w/ intraocular lens  implant, bilateral Bilateral   . Dilation and curettage of uterus  2012  . Back surgery  2009/ 12/16    spurs resting on nerve, lumbar  . Total hip arthroplasty Right 09/20/2012    Procedure: TOTAL HIP ARTHROPLASTY;  Surgeon: Kerin Salen, MD;  Location: [place];  Service: Orthopedics;  Laterality: Right;  . Orif pelvic fracture Right 09/20/2012    Procedure: OPEN REDUCTION INTERNAL FIXATION (ORIF) PELVIC FRACTURE;  Surgeon: Kerin Salen, MD;  Location: [place];  Service: Orthopedics;  Laterality: Right;  . Bone spurs removed form spine 2007  2007  . Kidney surgery Right 05/2014    growth removed  . Tonsillectomy    . Colonoscopy    . Lumbar laminectomy/decompression microdiscectomy Left 06/11/2015    Procedure: LUMBAR LAMINECTOMY/DECOMPRESSION MICRODISCECTOMY  LUMBAR THREE-FOUR;  Surgeon: Kristeen Miss, MD;  Location: [place] [facility];  Service: Neurosurgery;  Laterality: Left;   Family History  Problem Relation Age of Onset  . Breast cancer Maternal Grandmother   . Arthritis Maternal Grandmother   . Hypertension Other   . Osteoporosis Other   . Breast cancer Mother     had mastectomy in 1982  . Heart disease Mother   . Stroke Mother     mild, 2001  . Hypertension Mother   . Osteoporosis Mother     also had spine surgery 1987, & hip repalcement  . Arthritis Sister   . Hypertension Sister   . Parkinsonism Maternal Uncle   . Arthritis Maternal Grandfather    Social History  Substance Use Topics  . Smoking status: Never Smoker   . Smokeless tobacco: Never Used  . Alcohol Use: No   OB History     No data available     Review of Systems  Constitutional: Negative for fever, diaphoresis, appetite change, fatigue and unexpected weight change.  HENT: Negative for mouth sores.   Eyes: Negative for visual disturbance.  Respiratory: Negative for cough, chest tightness, shortness of breath and wheezing.   Cardiovascular: Negative for chest pain.  Gastrointestinal: Negative for nausea, vomiting, abdominal pain, diarrhea and constipation.  Endocrine: Negative for polydipsia, polyphagia and polyuria.  Genitourinary: Negative for dysuria, urgency, frequency and hematuria.  Musculoskeletal: Negative for back pain and neck stiffness.  Skin: Positive for wound. Negative for rash.  Allergic/Immunologic: Negative for immunocompromised state.  Neurological: Positive for headaches. Negative for syncope and light-headedness.  Hematological: Does not bruise/bleed easily.  Psychiatric/Behavioral: Negative for sleep disturbance. The patient is not nervous/anxious.       Allergies  Gabapentin and Tramadol  Home Medications   Prior to Admission medications   Medication Sig Start Date End Date Taking? Authorizing Provider  alendronate (FOSAMAX) 70 MG tablet TAKE 1 TABLET EVERY SATURDAY AT 6 P.M. TAKE WITH A FULL GLASS OF WATER ON AN EMPTY STOMACH 11/20/14  Yes Hoyt Koch, MD  amoxicillin (AMOXIL) 500 MG capsule Take 2,000 mg by mouth as needed (Prior to dental appointments.).  12/12/14  Yes Historical Provider, MD  apixaban (ELIQUIS) 5 MG TABS tablet Take 1 tablet (5 mg total) by mouth 2 (two) times daily. 02/12/15  Yes Minus Breeding, MD  BUTRANS 5 MCG/HR PTWK patch Place 5 mcg onto the skin once a week.  11/06/15  Yes Historical Provider, MD  Calcium Carbonate-Vitamin D (CALCIUM + D PO) Take 1 tablet by mouth 2 (two) times daily. 630mg    Yes Historical Provider, MD  carbidopa-levodopa (SINEMET IR) 25-100 MG tablet Take 1 tablet by mouth 4 (four) times daily. 07/19/15  Yes Star Age, MD   LYRICA 75 MG capsule Take 75 mg by mouth 2 (two) times daily. 11/23/15  Yes Historical Provider, MD  metoprolol succinate (TOPROL-XL) 100 MG 24 hr tablet TAKE 1 TABLET DAILY TAKE WITH OR IMMEDIATELY FOLLOWING A MEAL 07/26/15  Yes Minus Breeding, MD  mirabegron ER (MYRBETRIQ) 50 MG TB24 tablet Take 1 tablet (50 mg total) by mouth daily. 08/09/15  Yes Hoyt Koch, MD  pantoprazole (PROTONIX) 40 MG tablet Take 1 tablet (40 mg total) by mouth daily. 08/08/15  Yes Hoyt Koch, MD  rasagiline (AZILECT) 1 MG TABS tablet Take 1 tablet (1 mg total) by mouth daily. 02/20/15  Yes Star Age, MD  lidocaine (LIDODERM) 5 % Place 1 patch onto the skin daily. Remove & Discard  patch within 12 hours or as directed by MD Patient not taking: Reported on 11/24/2015 10/03/15   Hoyt Koch, MD   BP 169/68 mmHg  Pulse 65  Temp(Src) 97.3 F (36.3 C) (Oral)  Resp 18  SpO2 97% Physical Exam  Constitutional: She is oriented to person, place, and time. She appears well-developed and well-nourished. No distress.  HENT:  Head: Normocephalic and atraumatic.  Mouth/Throat: Oropharynx is clear and moist.  Eyes: Conjunctivae and EOM are normal. Pupils are equal, round, and reactive to light. No scleral icterus.  No horizontal, vertical or rotational nystagmus  Neck: Normal range of motion. Neck supple.  Full active and passive ROM without pain No midline or paraspinal tenderness No nuchal rigidity or meningeal signs  Cardiovascular: Normal rate, regular rhythm, normal heart sounds and intact distal pulses.   Pulmonary/Chest: Effort normal and breath sounds normal. No respiratory distress. She has no wheezes. She has no rales. She exhibits tenderness ( right chest tenderness).  Equal chest rise Clear and equal breath sounds No deformity, crepitus, flail segment or ecchymosis  Abdominal: Soft. Bowel sounds are normal. There is no tenderness. There is no rebound and no guarding.  Musculoskeletal:  Normal range of motion. She exhibits edema.  Full range of motion of bilateral shoulders, elbows, wrists and fingers Full range of motion of bilateral hips, knees, ankles and toes 2+ pitting edema noted to the left lower extremity, patient reports baseline  Lymphadenopathy:    She has no cervical adenopathy.  Neurological: She is alert and oriented to person, place, and time. She has normal reflexes. No cranial nerve deficit. She exhibits normal muscle tone. Coordination normal.  Mental Status:  Alert, oriented, thought content appropriate. Speech fluent without evidence of aphasia. Able to follow 2 step commands without difficulty.  Cranial Nerves:  II:  Peripheral visual fields grossly normal, pupils equal, round, reactive to light III,IV, VI: ptosis not present, extra-ocular motions intact bilaterally  V,VII: smile symmetric, facial light touch sensation equal VIII: hearing grossly normal bilaterally  IX,X: midline uvula rise  XI: bilateral shoulder shrug equal and strong XII: midline tongue extension  Motor:  5/5 in upper and lower extremities bilaterally including strong and equal grip strength and dorsiflexion/plantar flexion Sensory: Pinprick and light touch normal in all extremities.  Deep Tendon Reflexes: 2+ and symmetric  Cerebellar: normal finger-to-nose with bilateral upper extremities Gait: normal gait and balance CV: distal pulses palpable throughout   Skin: Skin is warm and dry. No rash noted. She is not diaphoretic.  Psychiatric: She has a normal mood and affect. Her behavior is normal. Judgment and thought content normal.  Nursing note and vitals reviewed.   ED Course  .Marland KitchenLaceration Repair Date/Time: 11/24/2015 9:24 PM Performed by: Abigail Butts Authorized by: Abigail Butts Consent: Verbal consent obtained. Risks and benefits: risks, benefits and alternatives were discussed Consent given by: patient Patient understanding: patient states understanding  of the procedure being performed Patient consent: the patient's understanding of the procedure matches consent given Procedure consent: procedure consent matches procedure scheduled Relevant documents: relevant documents present and verified Site marked: the operative site was marked Imaging studies: imaging studies available Required items: required blood products, implants, devices, and special equipment available Patient identity confirmed: verbally with patient Time out: Immediately prior to procedure a "time out" was called to verify the correct patient, procedure, equipment, support staff and site/side marked as required. Body area: head/neck Location details: scalp Laceration length: 3.5 cm Foreign bodies: no foreign bodies Tendon involvement: none  Nerve involvement: none Vascular damage: no Patient sedated: no Preparation: Patient was prepped and draped in the usual sterile fashion. Irrigation solution: saline Irrigation method: syringe Amount of cleaning: extensive Skin closure: staples Number of sutures: 3 Approximation: close Approximation difficulty: simple Dressing: 4x4 sterile gauze Patient tolerance: Patient tolerated the procedure well with no immediate complications   (including critical care time) Labs Review Labs Reviewed - No data to display  Imaging Review Dg Ribs Unilateral W/chest Right  11/24/2015  CLINICAL DATA:  Post unwitnessed fall with right rib pain. EXAM: RIGHT RIBS AND CHEST - 3+ VIEW COMPARISON:  Chest radiograph 04/22/2014 FINDINGS: No fracture or other bone lesions are seen involving the right ribs. There is no evidence of pneumothorax or pleural effusion. Both lungs are clear. Cardiomediastinal contours are unchanged, partially distorted by scoliotic curvature in the thoracic spine. IMPRESSION: No fracture of the right ribs or acute intrathoracic abnormality. Electronically Signed   By: Jeb Levering M.D.   On: 11/24/2015 20:14   Ct Head Wo  Contrast  11/24/2015  CLINICAL DATA:  Parkinson disease.  Fall.  Head trauma. EXAM: CT HEAD WITHOUT CONTRAST CT CERVICAL SPINE WITHOUT CONTRAST TECHNIQUE: Multidetector CT imaging of the head and cervical spine was performed following the standard protocol without intravenous contrast. Multiplanar CT image reconstructions of the cervical spine were also generated. COMPARISON:  None. FINDINGS: CT HEAD FINDINGS Small to moderate left frontoparietal scalp contusion with associated subcutaneous emphysema suggesting overlying laceration. No evidence of parenchymal hemorrhage or extra-axial fluid collection. No mass lesion, mass effect, or midline shift. No CT evidence of acute infarction. Intracranial atherosclerosis. Nonspecific mild subcortical and periventricular white matter hypodensity, most in keeping with chronic small vessel ischemic change. Cerebral volume is age appropriate. No ventriculomegaly. The visualized paranasal sinuses are essentially clear. The mastoid air cells are unopacified. No evidence of calvarial fracture. CT CERVICAL SPINE FINDINGS Tiny osseous fragments at the posterior tip of the C7 spinous process could represent tiny avulsion fragments. Otherwise no fracture is detected in the cervical spine. No prevertebral soft tissue swelling. There is straightening of the cervical spine, usually due to positioning and/or muscle spasm. Dens is well positioned between the lateral masses of C1. The lateral masses appear well-aligned. Moderate degenerative disc disease throughout the cervical spine, most prominent at C5-6 and C6-7. Moderate bilateral facet arthropathy. Mild-to-moderate degenerative foraminal stenosis on right at C6-7. Minimal 2 mm anterolisthesis at C4-5. Minimal 2 mm retrolisthesis at C5-6. Minimal 2 mm retrolisthesis at C6-7. Visualized mastoid air cells appear clear. No evidence of intra-axial hemorrhage in the visualized brain. No gross cervical canal hematoma. No significant  pulmonary nodules at the visualized lung apices. No cervical adenopathy or other significant neck soft tissue abnormality. IMPRESSION: 1. Small moderate left frontoparietal scalp contusion with subcutaneous emphysema, suggesting overlying laceration. 2. No evidence of acute intracranial abnormality. No evidence of calvarial fracture. 3. Mild chronic small vessel ischemia. 4. Possible tiny avulsion fracture fragments at the posterior tip of the C7 spinous process. Otherwise no cervical spine fracture. 5. Moderate degenerative changes throughout the cervical spine as described. Minimal multilevel spondylolisthesis in the cervical spine is probably degenerative. Electronically Signed   By: Ilona Sorrel M.D.   On: 11/24/2015 20:07   Ct Cervical Spine Wo Contrast  11/24/2015  CLINICAL DATA:  Parkinson disease.  Fall.  Head trauma. EXAM: CT HEAD WITHOUT CONTRAST CT CERVICAL SPINE WITHOUT CONTRAST TECHNIQUE: Multidetector CT imaging of the head and cervical spine was performed following the standard protocol without intravenous  contrast. Multiplanar CT image reconstructions of the cervical spine were also generated. COMPARISON:  None. FINDINGS: CT HEAD FINDINGS Small to moderate left frontoparietal scalp contusion with associated subcutaneous emphysema suggesting overlying laceration. No evidence of parenchymal hemorrhage or extra-axial fluid collection. No mass lesion, mass effect, or midline shift. No CT evidence of acute infarction. Intracranial atherosclerosis. Nonspecific mild subcortical and periventricular white matter hypodensity, most in keeping with chronic small vessel ischemic change. Cerebral volume is age appropriate. No ventriculomegaly. The visualized paranasal sinuses are essentially clear. The mastoid air cells are unopacified. No evidence of calvarial fracture. CT CERVICAL SPINE FINDINGS Tiny osseous fragments at the posterior tip of the C7 spinous process could represent tiny avulsion fragments.  Otherwise no fracture is detected in the cervical spine. No prevertebral soft tissue swelling. There is straightening of the cervical spine, usually due to positioning and/or muscle spasm. Dens is well positioned between the lateral masses of C1. The lateral masses appear well-aligned. Moderate degenerative disc disease throughout the cervical spine, most prominent at C5-6 and C6-7. Moderate bilateral facet arthropathy. Mild-to-moderate degenerative foraminal stenosis on right at C6-7. Minimal 2 mm anterolisthesis at C4-5. Minimal 2 mm retrolisthesis at C5-6. Minimal 2 mm retrolisthesis at C6-7. Visualized mastoid air cells appear clear. No evidence of intra-axial hemorrhage in the visualized brain. No gross cervical canal hematoma. No significant pulmonary nodules at the visualized lung apices. No cervical adenopathy or other significant neck soft tissue abnormality. IMPRESSION: 1. Small moderate left frontoparietal scalp contusion with subcutaneous emphysema, suggesting overlying laceration. 2. No evidence of acute intracranial abnormality. No evidence of calvarial fracture. 3. Mild chronic small vessel ischemia. 4. Possible tiny avulsion fracture fragments at the posterior tip of the C7 spinous process. Otherwise no cervical spine fracture. 5. Moderate degenerative changes throughout the cervical spine as described. Minimal multilevel spondylolisthesis in the cervical spine is probably degenerative. Electronically Signed   By: Ilona Sorrel M.D.   On: 11/24/2015 20:07   I have personally reviewed and evaluated these images and lab results as part of my medical decision-making.    MDM   Final diagnoses:  Fall at nursing home, initial encounter  Scalp laceration, initial encounter  Parkinson disease ([place])  Anticoagulated   JEWELZ PAPANIA presents with laceration to the left scalp after mechanical fall.  Pt is adamant that she did not have any syncope.  Pressure irrigation performed. Wound explored and  base of wound visualized in a bloodless field without evidence of foreign body.  Laceration occurred < 8 hours prior to repair which was well tolerated. Tdap updated.  Pt has no comorbidities to effect normal wound healing. Pt discharged without antibiotics.  Discussed suture home care with patient and answered questions. Pt to follow-up for wound check and suture removal in 5days; they are to return to the ED sooner for signs of infection. Pt is hemodynamically stable with no complaints prior to dc. CT scan shows possible avulsion fracture to the tip of the C7 spinous process. Patient does not have pinpoint tenderness here. This is likely an incidental finding.  No rib fractures on chest x-ray.  The patient was discussed with and seen by Dr. Jeneen Rinks who agrees with the treatment plan.      Jarrett Soho Myrene Bougher, PA-C 11/24/15 2129  Tanna Furry, MD 12/07/15 [phone]

## 2015-11-24 NOTE — ED Notes (Signed)
Per EMS, pt from [facility].  Pt fell striking behind left ear.  Laceration.  No LOC.  Not witnessed.  Pt found on floor approx 30 min post fall.  Bleeding controlled with wrap.  No blood thinners.  Vitals:  196/88, hr 74, resp 16, 98% ra

## 2015-11-24 NOTE — ED Notes (Signed)
Bed: [facility] Expected date:  Expected time:  Means of arrival:  Comments: EMS fall 

## 2015-11-24 NOTE — ED Notes (Signed)
Patient transported to CT 

## 2015-11-29 ENCOUNTER — Encounter: Payer: Self-pay | Admitting: Internal Medicine

## 2015-11-30 ENCOUNTER — Telehealth: Payer: Self-pay | Admitting: Internal Medicine

## 2015-11-30 ENCOUNTER — Encounter: Payer: Self-pay | Admitting: Family

## 2015-11-30 ENCOUNTER — Ambulatory Visit: Payer: Medicare Other | Admitting: Internal Medicine

## 2015-11-30 ENCOUNTER — Ambulatory Visit: Payer: Medicare Other | Admitting: Family

## 2015-11-30 ENCOUNTER — Ambulatory Visit (INDEPENDENT_AMBULATORY_CARE_PROVIDER_SITE_OTHER): Payer: Medicare Other | Admitting: Family

## 2015-11-30 VITALS — BP 170/90 | HR 77 | Temp 98.0°F | Resp 16 | Ht 63.5 in | Wt 123.8 lb

## 2015-11-30 DIAGNOSIS — IMO0001 Reserved for inherently not codable concepts without codable children: Secondary | ICD-10-CM

## 2015-11-30 DIAGNOSIS — R030 Elevated blood-pressure reading, without diagnosis of hypertension: Secondary | ICD-10-CM

## 2015-11-30 DIAGNOSIS — S0191XD Laceration without foreign body of unspecified part of head, subsequent encounter: Secondary | ICD-10-CM

## 2015-11-30 DIAGNOSIS — S0191XA Laceration without foreign body of unspecified part of head, initial encounter: Secondary | ICD-10-CM | POA: Insufficient documentation

## 2015-11-30 NOTE — Assessment & Plan Note (Signed)
Laceration appears well healed with no evidence of infection and good approximation. Staples removed without complication and tolerated well by patient. Advised Tylenol and ice as needed for discomfort. Basic wound care of soap and water for the wound. Follow up if symptoms worsen.

## 2015-11-30 NOTE — Progress Notes (Signed)
Subjective:    Patient ID: Dawn Ashley, female    DOB: 08/06/1935, 80 y.o.   MRN: [id number]  Chief Complaint  Patient presents with  . Suture / Staple Removal    HPI:  Dawn Ashley is a 80 y.o. female who  has a past medical history of Parkinson disease ([place]); Back pain; Heart murmur; Arthritis; Swelling; Renal mass, right; Frequency of urination; MVP (mitral valve prolapse); Atrial fibrillation ([place]); Osteoporosis; Depression; GERD (gastroesophageal reflux disease); History of vertigo; Peripheral neuropathy ([place]); Bruises easily; History of kidney stones; Nocturia; and Anemia. and presents today for an acute office visit.   This is a new problem. Recently experienced a fall while ambulating when she lost her balance and fell. There was no loss of consciousness but experienced an acute laceration to the left side of her which was repaired with staples. Denies headaches. There was concern because she is on Eliquis for anticoagulation. Denies any fevers, chills or other signs of infection. Presents for staple removal.  Allergies  Allergen Reactions  . Gabapentin Other (See Comments)    sleepy  . Tramadol Other (See Comments)    Confusion and sleepy     Current Outpatient Prescriptions on File Prior to Visit  Medication Sig Dispense Refill  . alendronate (FOSAMAX) 70 MG tablet TAKE 1 TABLET EVERY SATURDAY AT 6 P.M. TAKE WITH A FULL GLASS OF WATER ON AN EMPTY STOMACH 12 tablet 3  . amoxicillin (AMOXIL) 500 MG capsule Take 2,000 mg by mouth as needed (Prior to dental appointments.).     Marland Kitchen apixaban (ELIQUIS) 5 MG TABS tablet Take 1 tablet (5 mg total) by mouth 2 (two) times daily. 60 tablet 6  . BUTRANS 5 MCG/HR PTWK patch Place 5 mcg onto the skin once a week.     . Calcium Carbonate-Vitamin D (CALCIUM + D PO) Take 1 tablet by mouth 2 (two) times daily. 630mg     . carbidopa-levodopa (SINEMET IR) 25-100 MG tablet Take 1 tablet by mouth 4 (four) times daily. 360 tablet 3  . lidocaine  (LIDODERM) 5 % Place 1 patch onto the skin daily. Remove & Discard patch within 12 hours or as directed by MD 30 patch 3  . LYRICA 75 MG capsule Take 75 mg by mouth 2 (two) times daily.    . metoprolol succinate (TOPROL-XL) 100 MG 24 hr tablet TAKE 1 TABLET DAILY TAKE WITH OR IMMEDIATELY FOLLOWING A MEAL 90 tablet 2  . mirabegron ER (MYRBETRIQ) 50 MG TB24 tablet Take 1 tablet (50 mg total) by mouth daily. 30 tablet   . pantoprazole (PROTONIX) 40 MG tablet Take 1 tablet (40 mg total) by mouth daily. 90 tablet 3  . rasagiline (AZILECT) 1 MG TABS tablet Take 1 tablet (1 mg total) by mouth daily. 90 tablet 3   No current facility-administered medications on file prior to visit.     Review of Systems  Constitutional: Negative for fever and chills.  Respiratory: Negative for chest tightness and shortness of breath.   Cardiovascular: Negative for chest pain, palpitations and leg swelling.  Skin: Positive for wound.  Neurological: Negative for dizziness, weakness and headaches.      Objective:    BP 170/90 mmHg  Pulse 77  Temp(Src) 98 F (36.7 C) (Oral)  Resp 16  Ht 5' 3.5" (1.613 m)  Wt 123 lb 12.8 oz (56.155 kg)  BMI 21.58 kg/m2  SpO2 97% Nursing note and vital signs reviewed.   Physical Exam  Constitutional: She is  oriented to person, place, and time. She appears well-developed and well-nourished. No distress.  HENT:  Laceration with good approximation, well healed and no evidence of infection. 3 staples are present. There is some discoloration and bruising noted behind the left ear.   Cardiovascular: Normal rate, regular rhythm, normal heart sounds and intact distal pulses.   Pulmonary/Chest: Effort normal and breath sounds normal.  Neurological: She is alert and oriented to person, place, and time.  Skin: Skin is warm and dry.  Psychiatric: She has a normal mood and affect. Her behavior is normal. Judgment and thought content normal.       Assessment & Plan:   Problem List  Items Addressed This Visit      Cardiovascular and Mediastinum   HTN, white coat    Blood pressure elevated today. Reports taking medications as prescribed and notes home reading this morning was 126/78. Continue to monitor.         Other   Laceration of head - Primary    Laceration appears well healed with no evidence of infection and good approximation. Staples removed without complication and tolerated well by patient. Advised Tylenol and ice as needed for discomfort. Basic wound care of soap and water for the wound. Follow up if symptoms worsen.          I am having Dawn Ashley maintain her Calcium Carbonate-Vitamin D (CALCIUM + D PO), alendronate, amoxicillin, apixaban, rasagiline, carbidopa-levodopa, metoprolol succinate, pantoprazole, mirabegron ER, lidocaine, BUTRANS, and LYRICA.   Follow-up: Return if symptoms worsen or fail to improve.  Mauricio Po, FNP

## 2015-11-30 NOTE — Telephone Encounter (Signed)
FYI:  OT evaluation was completed today . Will send order over for signature.

## 2015-11-30 NOTE — Progress Notes (Signed)
Pre visit review using our clinic review tool, if applicable. No additional management support is needed unless otherwise documented below in the visit note. 

## 2015-11-30 NOTE — Assessment & Plan Note (Signed)
Blood pressure elevated today. Reports taking medications as prescribed and notes home reading this morning was 126/78. Continue to monitor.

## 2015-11-30 NOTE — Telephone Encounter (Signed)
Pt would like to when she is due to follow up?  The last AVS did not say.

## 2015-11-30 NOTE — Patient Instructions (Addendum)
Thank you for choosing [facility].  Summary/Instructions:  Please continue to take your medications as prescribed.   Monitor your blood pressure.  Ice and Tylenol as you need for discomfort.  If your symptoms worsen or fail to improve, please contact our office for further instruction, or in case of emergency go directly to the emergency room at the closest medical facility.

## 2015-12-03 NOTE — Telephone Encounter (Signed)
Please advise 

## 2015-12-03 NOTE — Telephone Encounter (Signed)
Please have her follow up for a nurse visit in 3 weeks and then as needed.

## 2015-12-03 NOTE — Telephone Encounter (Signed)
Would recommend you ask the provider that she saw

## 2015-12-25 ENCOUNTER — Other Ambulatory Visit: Payer: Self-pay | Admitting: Internal Medicine

## 2015-12-26 ENCOUNTER — Encounter: Payer: Self-pay | Admitting: Cardiology

## 2015-12-26 ENCOUNTER — Encounter: Payer: Self-pay | Admitting: Neurology

## 2015-12-26 ENCOUNTER — Encounter: Payer: Self-pay | Admitting: Internal Medicine

## 2015-12-26 NOTE — Telephone Encounter (Signed)
Faxed to pharmacy

## 2016-01-11 ENCOUNTER — Other Ambulatory Visit: Payer: Self-pay | Admitting: *Deleted

## 2016-01-11 MED ORDER — ALENDRONATE SODIUM 70 MG PO TABS: ORAL_TABLET | ORAL | Status: AC

## 2016-01-16 ENCOUNTER — Telehealth: Payer: Self-pay

## 2016-01-16 DIAGNOSIS — R2689 Other abnormalities of gait and mobility: Secondary | ICD-10-CM | POA: Diagnosis not present

## 2016-01-16 DIAGNOSIS — G20 Parkinson's disease: Secondary | ICD-10-CM | POA: Diagnosis not present

## 2016-01-16 DIAGNOSIS — R030 Elevated blood-pressure reading, without diagnosis of hypertension: Secondary | ICD-10-CM | POA: Diagnosis not present

## 2016-01-16 DIAGNOSIS — M4806 Spinal stenosis, lumbar region: Secondary | ICD-10-CM | POA: Diagnosis not present

## 2016-01-16 NOTE — Telephone Encounter (Signed)
Home Health Cert/Plan of Care received (11/29/2015 - 01/27/2016) and placed on MD's desk for signature

## 2016-01-16 NOTE — Telephone Encounter (Signed)
Paperwork signed, faxed, copy sent to scan 

## 2016-01-22 ENCOUNTER — Other Ambulatory Visit: Payer: Self-pay | Admitting: *Deleted

## 2016-01-22 MED ORDER — APIXABAN 5 MG PO TABS: 5.0000 mg | ORAL_TABLET | Freq: Two times a day (BID) | ORAL | Status: AC

## 2016-01-22 NOTE — Telephone Encounter (Signed)
[facility] is requesting refill on pt's eliquis, I saw the email where she was moving to [place], should we refill with a note to [facility] to send to her current Doctor there or did she stay with Dr. Percival Spanish?

## 2016-01-22 NOTE — Telephone Encounter (Signed)
Rx has been sent to the pharmacy electronically. ° °

## 2016-01-29 ENCOUNTER — Telehealth: Payer: Self-pay | Admitting: Internal Medicine

## 2016-01-29 NOTE — Telephone Encounter (Signed)
Would like to know status of home health orders faxed over on 6/23.  Will fax again to side a fax.

## 2016-01-29 NOTE — Telephone Encounter (Signed)
Orders were faxed last week. They didn't get them. I have the orders and they have been placed on Dr. Nathanial Millman desk to be signed. I spoke to [place] and informed her that I would refax the orders tomorrow.

## 2016-02-04 ENCOUNTER — Telehealth: Payer: Self-pay | Admitting: *Deleted

## 2016-02-04 MED ORDER — PREGABALIN 75 MG PO CAPS: 75.0000 mg | ORAL_CAPSULE | Freq: Two times a day (BID) | ORAL | Status: DC

## 2016-02-04 NOTE — Telephone Encounter (Signed)
Printed and signed.  

## 2016-02-04 NOTE — Telephone Encounter (Signed)
[facility]...Dawn Ashley

## 2016-02-04 NOTE — Telephone Encounter (Signed)
Receive fax pt requesting refills on her Lyrica 90 day supply...Dawn Ashley

## 2016-02-08 ENCOUNTER — Other Ambulatory Visit: Payer: Self-pay | Admitting: Geriatric Medicine

## 2016-02-08 MED ORDER — PREGABALIN 75 MG PO CAPS: 75.0000 mg | ORAL_CAPSULE | Freq: Two times a day (BID) | ORAL | Status: AC

## 2016-02-08 NOTE — Telephone Encounter (Signed)
Faxed to pharmacy,.

## 2016-02-08 NOTE — Telephone Encounter (Signed)
Pt called to check up on this, [facility] does not have the fax we sent. Please check and call pt back

## 2016-02-18 ENCOUNTER — Telehealth: Payer: Self-pay

## 2016-02-18 ENCOUNTER — Telehealth: Payer: Self-pay | Admitting: Neurology

## 2016-02-18 NOTE — Telephone Encounter (Signed)
Any recommendations?

## 2016-02-18 NOTE — Telephone Encounter (Signed)
Patient is on the list for [facility] 2017 and may be a good candidate for an AWV in 2017. Please let me know if/when appt is scheduled.   

## 2016-02-18 NOTE — Telephone Encounter (Signed)
Pt called, c/a her appt bc she has moved to [place]. She is inquiring if Dr Rexene Alberts can recommend a neurologist in that area.

## 2016-02-19 NOTE — Telephone Encounter (Signed)
Call to fup on the AWV and LVM to schedule or call back

## 2016-02-28 ENCOUNTER — Telehealth: Payer: Self-pay | Admitting: Emergency Medicine

## 2016-02-28 NOTE — Telephone Encounter (Signed)
Pt needs verbal orders to continue PT. Twice a wk for 3 wks. Please follow up thanks.

## 2016-02-28 NOTE — Telephone Encounter (Signed)
Called and gave verbal orders to continue PT.

## 2016-03-12 ENCOUNTER — Telehealth: Payer: Self-pay

## 2016-03-12 NOTE — Telephone Encounter (Signed)
A user error has taken place: duplicate

## 2016-03-13 ENCOUNTER — Telehealth: Payer: Self-pay | Admitting: Internal Medicine

## 2016-03-13 NOTE — Telephone Encounter (Signed)
Patient called to advise that she has moved down to [place], and cannot get in to see a new doctor until the end of the month. She is asking for advice. She states that she is currently on lyrica and experiencing loss of balance. Please give her a call

## 2016-03-13 NOTE — Telephone Encounter (Signed)
I would recommend to stop the lyrica until they can get in with the doctor. If not improving can come here for visit or at urgent care near her residence.

## 2016-03-13 NOTE — Telephone Encounter (Signed)
Patient Name: Dawn Ashley  DOB: [birth year]    Initial Comment Caller states medication makes her shaky, unsteady. Med is Lyrica.   Nurse Assessment      Guidelines    Guideline Title Affirmed Question Affirmed Notes       Final Disposition User   FINAL ATTEMPT MADE - no message left Mallie Mussel, RN, Alveta Heimlich    Comments  [phone]- Attempt made. No answer after 10 rings.  1329- Attempt made. No answer after 10 rings.  1341- Final Attempt made. No answer after 10 rings.

## 2016-03-14 NOTE — Telephone Encounter (Signed)
Spoke to [employer] [employer] and informed him to have Mrs. Lydon to stop lyrica until she can get in with the doctor in [place].

## 2016-03-20 ENCOUNTER — Ambulatory Visit: Payer: Medicare Other | Admitting: Neurology

## 2016-03-25 ENCOUNTER — Telehealth: Payer: Self-pay | Admitting: Emergency Medicine

## 2016-03-25 NOTE — Telephone Encounter (Signed)
I called and spoke to [place] and gave verbal orders to extend PT 1 time a week for one week.

## 2016-03-25 NOTE — Telephone Encounter (Signed)
[facility] called and states the patient wasn't home on Friday. They are asking for verbal orders to extend her PT 1 time for 1  Wk. Please follow up thanks.

## 2016-04-17 ENCOUNTER — Telehealth: Payer: Self-pay

## 2016-04-17 NOTE — Telephone Encounter (Signed)
2nd outreach to Dawn Ashley; It appears she has [place] but not sure she is still in her home.  Call and left a message regarding need for AWV. To call he [facility] office and schedule. Can see at the same time she makes next apt with Dr. Sharlet Salina

## 2016-04-18 ENCOUNTER — Other Ambulatory Visit: Payer: Self-pay | Admitting: Neurology

## 2016-04-18 DIAGNOSIS — G20 Parkinson's disease: Secondary | ICD-10-CM

## 2016-05-08 ENCOUNTER — Other Ambulatory Visit (HOSPITAL_COMMUNITY): Payer: Self-pay | Admitting: Interventional Radiology

## 2016-05-08 DIAGNOSIS — N2889 Other specified disorders of kidney and ureter: Secondary | ICD-10-CM

## 2016-05-12 ENCOUNTER — Other Ambulatory Visit: Payer: Self-pay | Admitting: Radiology

## 2016-05-22 ENCOUNTER — Other Ambulatory Visit: Payer: Self-pay | Admitting: Cardiology

## 2016-05-22 NOTE — Telephone Encounter (Signed)
REFILL 

## 2016-07-16 ENCOUNTER — Other Ambulatory Visit: Payer: Self-pay | Admitting: Internal Medicine

## 2016-07-29 ENCOUNTER — Other Ambulatory Visit: Payer: Self-pay | Admitting: Neurology

## 2016-07-29 DIAGNOSIS — Z96649 Presence of unspecified artificial hip joint: Secondary | ICD-10-CM

## 2016-07-29 DIAGNOSIS — G20 Parkinson's disease: Secondary | ICD-10-CM

## 2016-07-29 DIAGNOSIS — M545 Low back pain, unspecified: Secondary | ICD-10-CM

## 2016-07-29 DIAGNOSIS — M25551 Pain in right hip: Secondary | ICD-10-CM

## 2016-07-29 DIAGNOSIS — R600 Localized edema: Secondary | ICD-10-CM

## 2016-07-29 DIAGNOSIS — M4120 Other idiopathic scoliosis, site unspecified: Secondary | ICD-10-CM

## 2016-08-05 ENCOUNTER — Other Ambulatory Visit: Payer: Self-pay | Admitting: Neurology

## 2016-08-05 DIAGNOSIS — G20 Parkinson's disease: Secondary | ICD-10-CM

## 2016-08-06 ENCOUNTER — Encounter: Payer: Self-pay | Admitting: Interventional Radiology

## 2016-10-16 IMAGING — CR DG LUMBAR SPINE 2-3V
2 series · 2 of 2 positions shown · non-contrast
Comparison: None

CLINICAL DATA: Operative localization imaging for lumbar surgery.

EXAM:
LUMBAR SPINE - 2-3 VIEW

[lateral (1 of 2)]
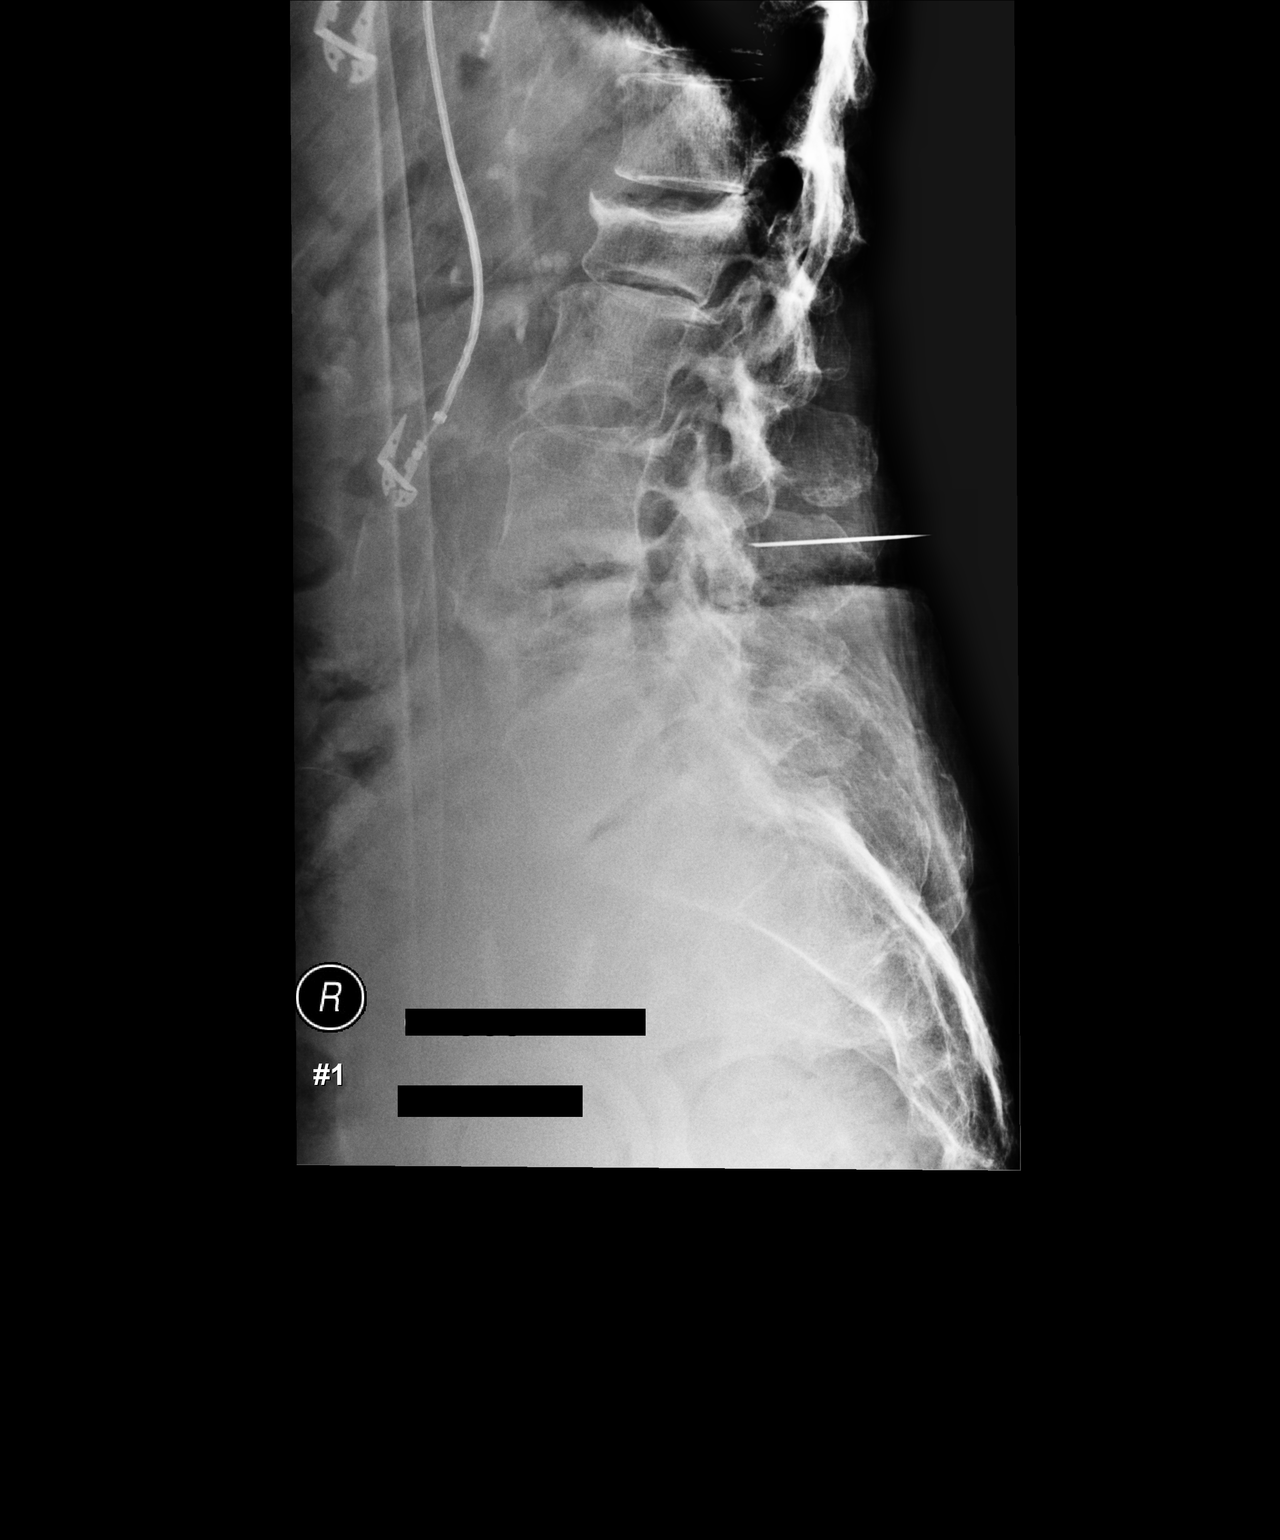

[lateral (2 of 2)]
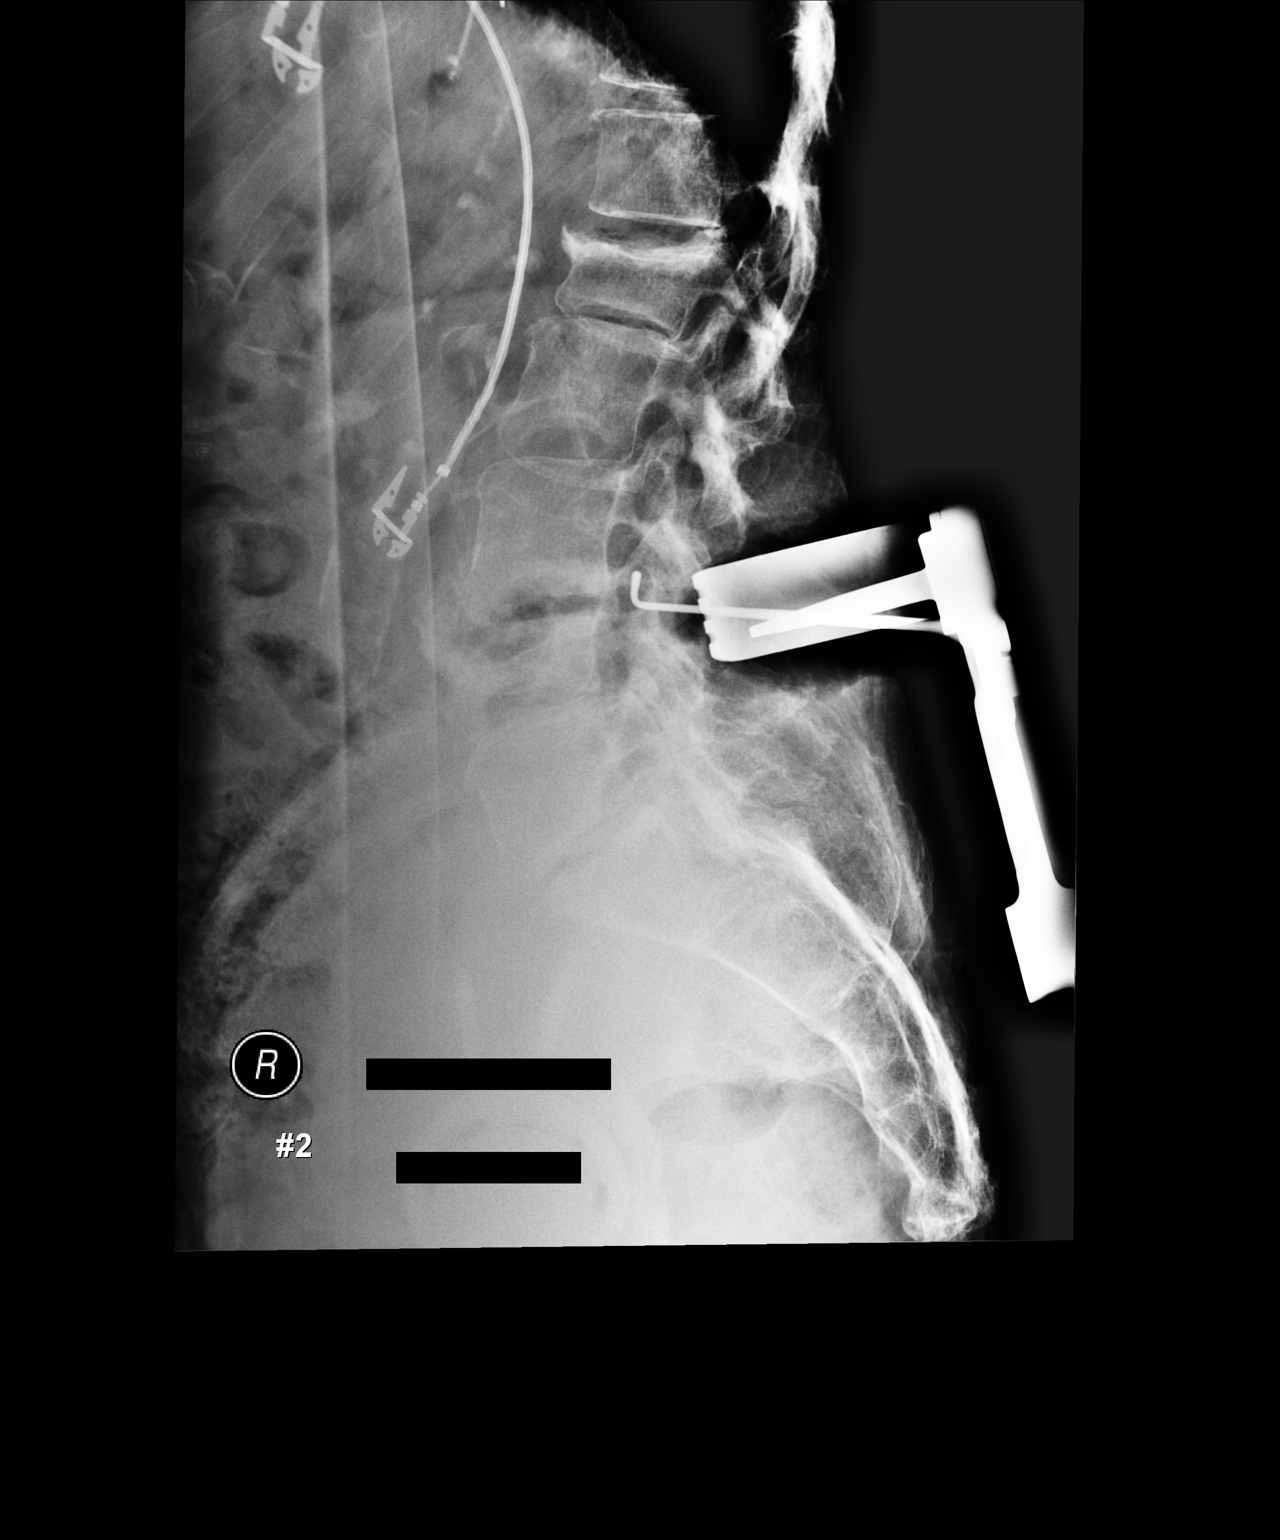

[2 of 2 positions shown; findings below may reference images not displayed]

FINDINGS: Initial image shows placement of a needle posterior to the lower
posterior aspect of the L3 vertebrae, superimposed of the L3 spinous
process. Subsequent image shows placement of a surgical instrument
through posterior skin retractors. The tip lies posterior to the
L3-L4 disc.
IMPRESSION: Portable lumbar spine imaging for surgical localization.

## 2016-10-23 ENCOUNTER — Other Ambulatory Visit: Payer: Self-pay | Admitting: Neurology

## 2016-10-23 DIAGNOSIS — G20 Parkinson's disease: Secondary | ICD-10-CM

## 2016-10-23 DIAGNOSIS — M25551 Pain in right hip: Secondary | ICD-10-CM

## 2016-10-23 DIAGNOSIS — M545 Low back pain, unspecified: Secondary | ICD-10-CM

## 2016-10-23 DIAGNOSIS — M4120 Other idiopathic scoliosis, site unspecified: Secondary | ICD-10-CM

## 2016-10-23 DIAGNOSIS — R600 Localized edema: Secondary | ICD-10-CM

## 2016-10-23 DIAGNOSIS — G20A1 Parkinson's disease without dyskinesia, without mention of fluctuations: Secondary | ICD-10-CM

## 2016-10-23 DIAGNOSIS — Z96649 Presence of unspecified artificial hip joint: Secondary | ICD-10-CM

## 2016-11-20 ENCOUNTER — Other Ambulatory Visit: Payer: Self-pay | Admitting: Cardiology

## 2016-11-20 NOTE — Telephone Encounter (Signed)
NEEDS OV 

## 2017-01-02 ENCOUNTER — Telehealth: Payer: Self-pay | Admitting: *Deleted

## 2017-01-02 ENCOUNTER — Other Ambulatory Visit: Payer: Self-pay | Admitting: Internal Medicine

## 2017-01-02 ENCOUNTER — Other Ambulatory Visit: Payer: Self-pay | Admitting: Cardiology

## 2017-01-02 NOTE — Telephone Encounter (Signed)
Prescription refill for Metoprolol 100 mg had been sent in by [facility]. The patient is due for an appointment. When the patient was called, she stated that she no longer lives in [place] and has moved to [place]. Per the patient, she sees Dr. Cherrie Distance, cardiologist, now. [facility] has been notified of this change and verbalized that they will make the change in their system.

## 2017-02-05 ENCOUNTER — Other Ambulatory Visit: Payer: Self-pay | Admitting: Cardiology

## 2017-02-23 ENCOUNTER — Other Ambulatory Visit: Payer: Self-pay | Admitting: *Deleted

## 2017-02-23 MED ORDER — METOPROLOL SUCCINATE ER 100 MG PO TB24: 100.0000 mg | ORAL_TABLET | Freq: Every day | ORAL | 0 refills | Status: AC

## 2017-03-31 IMAGING — CT CT CERVICAL SPINE W/O CM
3 of 6 series · 12 of 33 positions shown, 14 images · non-contrast
Comparison: None.

CLINICAL DATA: Parkinson disease.  Fall.  Head trauma.

EXAM:
CT HEAD WITHOUT CONTRAST
CT CERVICAL SPINE WITHOUT CONTRAST
TECHNIQUE: Multidetector CT imaging of the head and cervical spine was
performed following the standard protocol without intravenous
contrast. Multiplanar CT image reconstructions of the cervical spine
were also generated.

[Series 8: axial recon · axial · 0.23mm/px · z∈[-304,-180]mm · 4 of 109 slices shown, 5 images]
[im 22/109  soft-tissue]
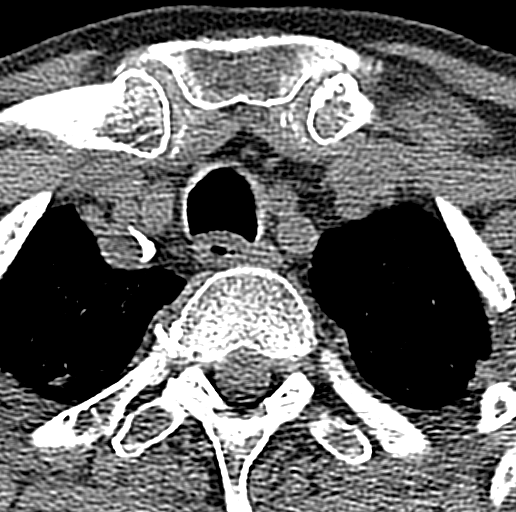
[im 22/109  bone]
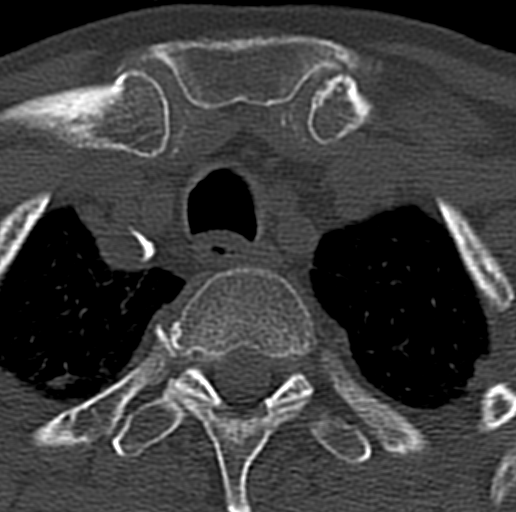
[im 44/109  bone]
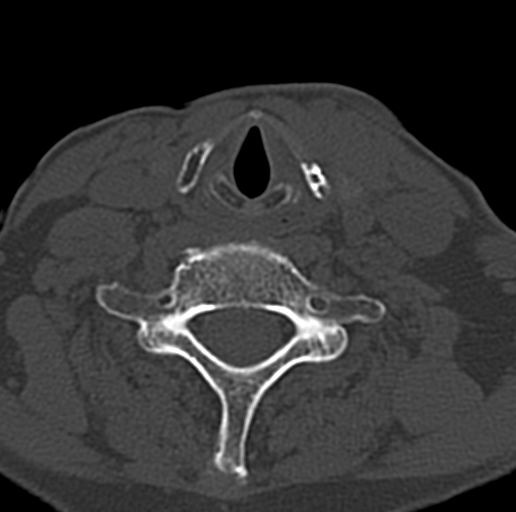
[im 65/109  bone]
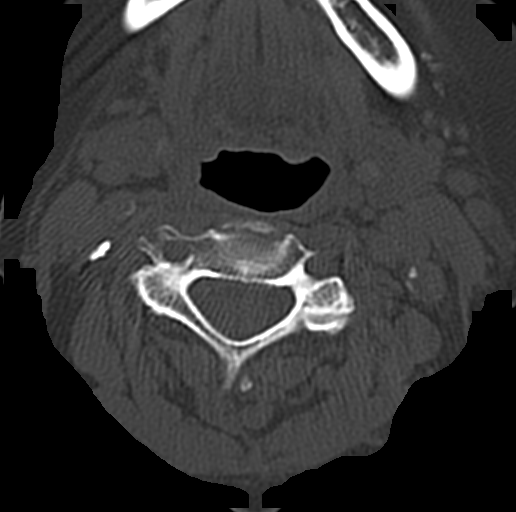
[im 87/109  bone]
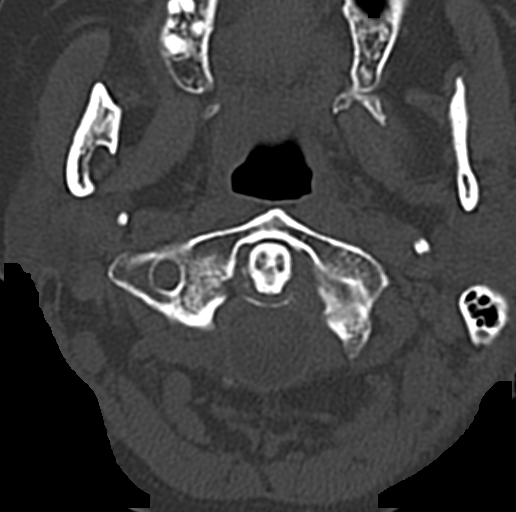

[Series 9: coronal · coronal · 0.24mm/px · 3 of 73 slices shown]
[im 19/73  bone]
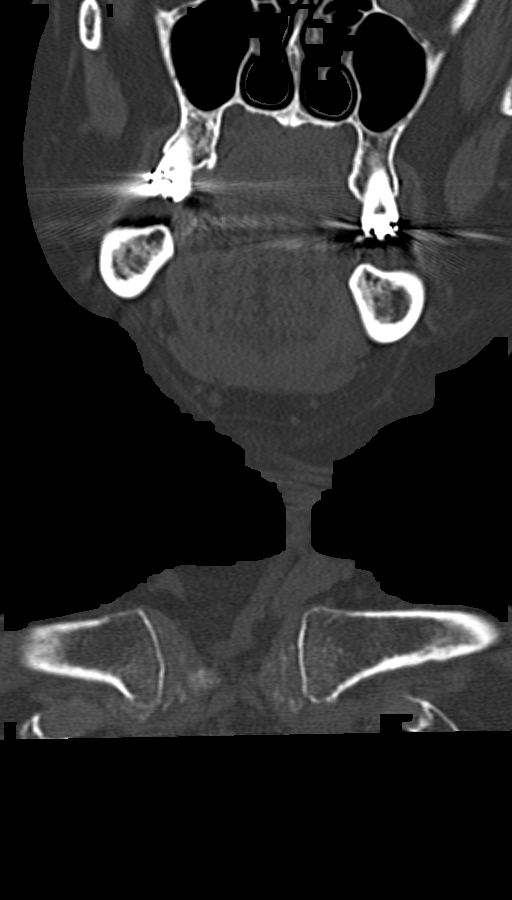
[im 31/73  bone]
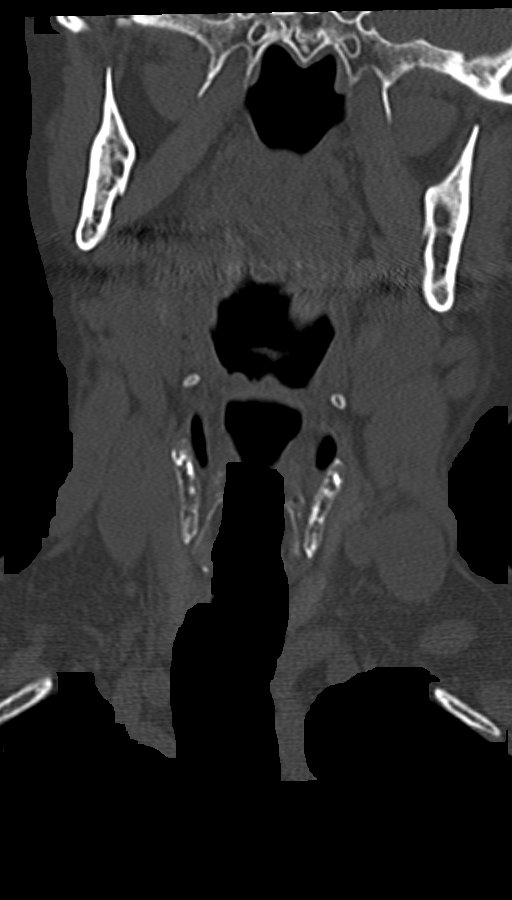
[im 42/73  bone]
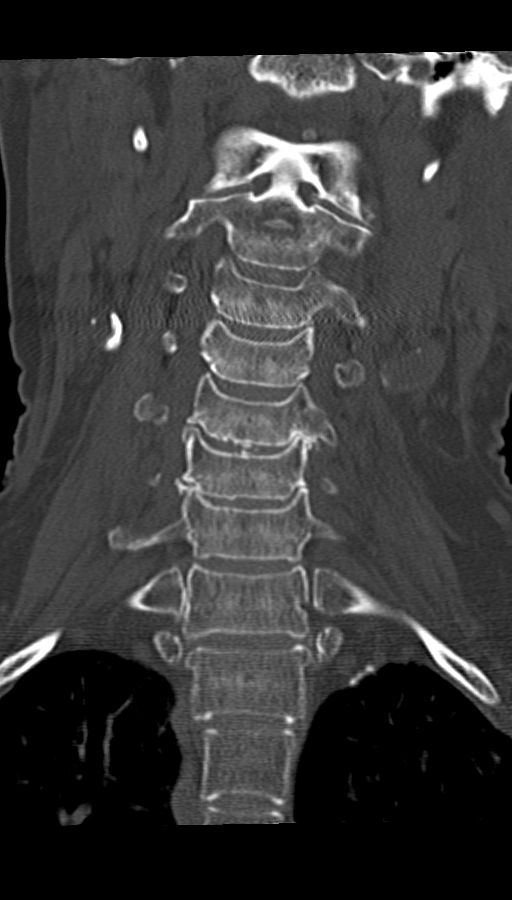

[Series 10: sagittal · sagittal · 0.32mm/px · 5 of 66 slices shown, 6 images]
[im 22/66  bone]
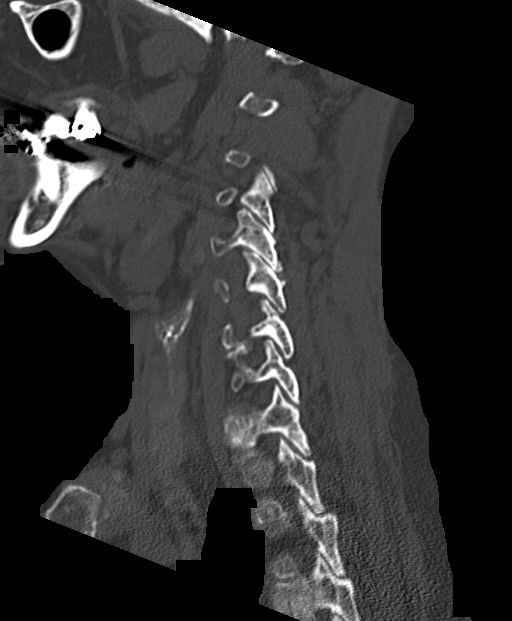
[im 28/66  bone]
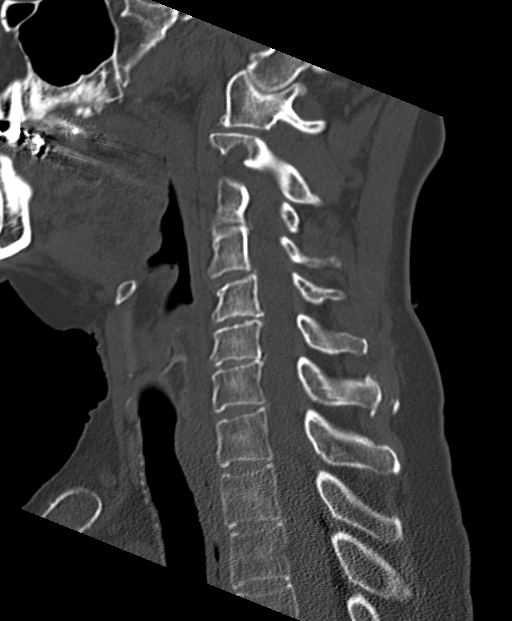
[im 33/66  soft-tissue]
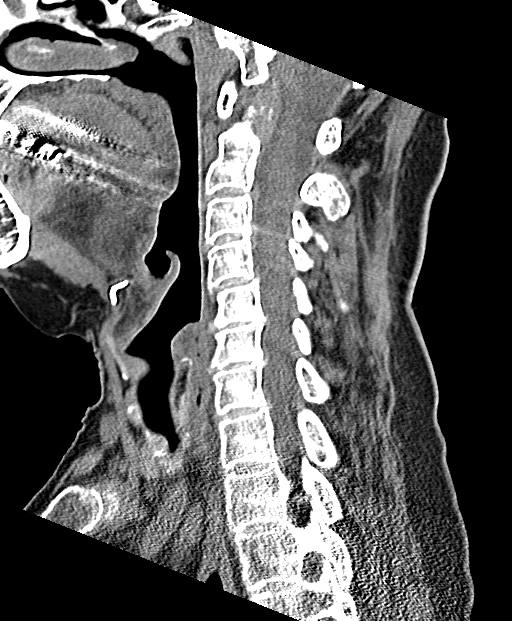
[im 33/66  bone]
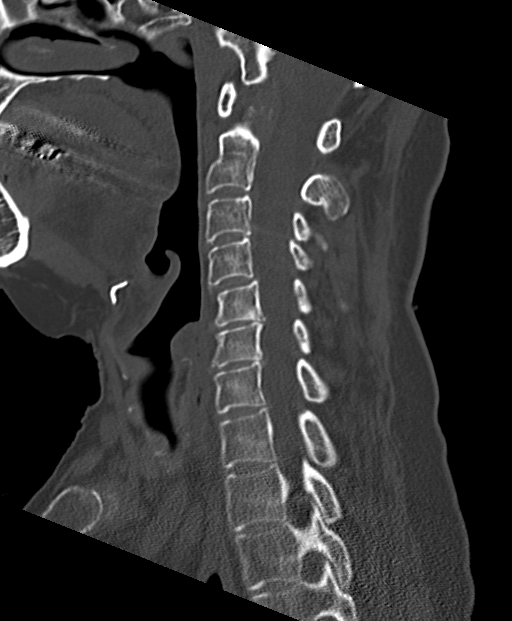
[im 38/66  bone]
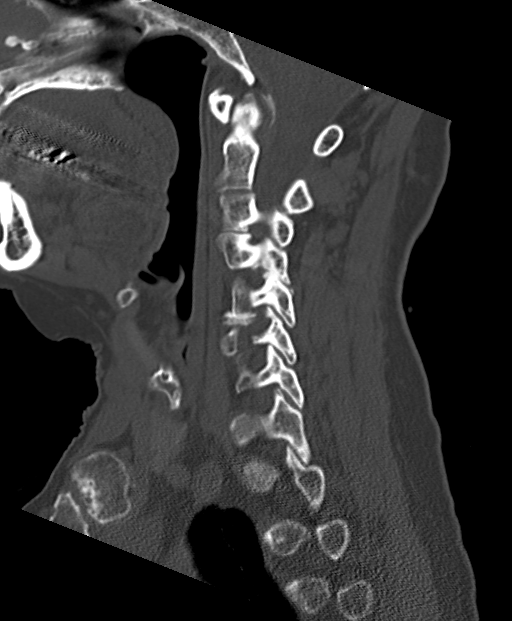
[im 44/66  bone]
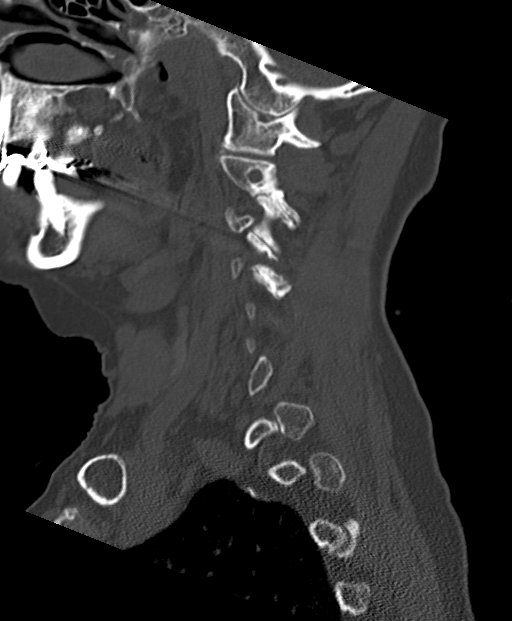

[12 of 33 positions shown; findings below may reference images not displayed]

FINDINGS: CT HEAD FINDINGS

Small to moderate left frontoparietal scalp contusion with
associated subcutaneous emphysema suggesting overlying laceration.
No evidence of parenchymal hemorrhage or extra-axial fluid
collection. No mass lesion, mass effect, or midline shift.

No CT evidence of acute infarction. Intracranial atherosclerosis.
Nonspecific mild subcortical and periventricular white matter
hypodensity, most in keeping with chronic small vessel ischemic
change.

Cerebral volume is age appropriate. No ventriculomegaly.

The visualized paranasal sinuses are essentially clear. The mastoid
air cells are unopacified. No evidence of calvarial fracture.

CT CERVICAL SPINE FINDINGS

Tiny osseous fragments at the posterior tip of the C7 spinous
process could represent tiny avulsion fragments. Otherwise no
fracture is detected in the cervical spine. No prevertebral soft
tissue swelling. There is straightening of the cervical spine,
usually due to positioning and/or muscle spasm. Dens is well
positioned between the lateral masses of C1. The lateral masses
appear well-aligned. Moderate degenerative disc disease throughout
the cervical spine, most prominent at C5-6 and C6-7. Moderate
bilateral facet arthropathy. Mild-to-moderate degenerative foraminal
stenosis on right at C6-7. Minimal 2 mm anterolisthesis at C4-5.
Minimal 2 mm retrolisthesis at C5-6. Minimal 2 mm retrolisthesis at
C6-7.

Visualized mastoid air cells appear clear. No evidence of
intra-axial hemorrhage in the visualized brain. No gross cervical
canal hematoma. No significant pulmonary nodules at the visualized
lung apices. No cervical adenopathy or other significant neck soft
tissue abnormality.
IMPRESSION: 1. Small moderate left frontoparietal scalp contusion with
subcutaneous emphysema, suggesting overlying laceration.
2. No evidence of acute intracranial abnormality. No evidence of
calvarial fracture.
3. Mild chronic small vessel ischemia.
4. Possible tiny avulsion fracture fragments at the posterior tip of
the C7 spinous process. Otherwise no cervical spine fracture.
5. Moderate degenerative changes throughout the cervical spine as
described. Minimal multilevel spondylolisthesis in the cervical
spine is probably degenerative.

## 2022-05-04 DEATH — deceased
